# Patient Record
Sex: Female | Born: 1935
Health system: Southern US, Community
[De-identification: ages and names within clinical notes are randomized; demographics above are authoritative.]

## PROBLEM LIST (undated history)

## (undated) DIAGNOSIS — I10 Essential (primary) hypertension: Secondary | ICD-10-CM

## (undated) DIAGNOSIS — I6529 Occlusion and stenosis of unspecified carotid artery: Secondary | ICD-10-CM

## (undated) DIAGNOSIS — J9690 Respiratory failure, unspecified, unspecified whether with hypoxia or hypercapnia: Secondary | ICD-10-CM

## (undated) DIAGNOSIS — I2510 Atherosclerotic heart disease of native coronary artery without angina pectoris: Secondary | ICD-10-CM

## (undated) DIAGNOSIS — J189 Pneumonia, unspecified organism: Secondary | ICD-10-CM

## (undated) DIAGNOSIS — E871 Hypo-osmolality and hyponatremia: Secondary | ICD-10-CM

## (undated) DIAGNOSIS — E785 Hyperlipidemia, unspecified: Secondary | ICD-10-CM

## (undated) DIAGNOSIS — N289 Disorder of kidney and ureter, unspecified: Secondary | ICD-10-CM

## (undated) DIAGNOSIS — I509 Heart failure, unspecified: Secondary | ICD-10-CM

## (undated) DIAGNOSIS — E079 Disorder of thyroid, unspecified: Secondary | ICD-10-CM

## (undated) DIAGNOSIS — M1990 Unspecified osteoarthritis, unspecified site: Secondary | ICD-10-CM

## (undated) HISTORY — PX: CAROTID ENDARTERECTOMY: SUR193

## (undated) HISTORY — DX: Disorder of thyroid, unspecified: E07.9

## (undated) HISTORY — DX: Essential (primary) hypertension: I10

## (undated) HISTORY — DX: Occlusion and stenosis of unspecified carotid artery: I65.29

## (undated) HISTORY — DX: Atherosclerotic heart disease of native coronary artery without angina pectoris: I25.10

## (undated) HISTORY — PX: CARDIAC CATHETERIZATION: SHX172

## (undated) HISTORY — DX: Hyperlipidemia, unspecified: E78.5

---

## 2001-02-18 ENCOUNTER — Encounter: Payer: Self-pay | Admitting: *Deleted

## 2001-02-22 ENCOUNTER — Inpatient Hospital Stay (HOSPITAL_COMMUNITY): Admission: RE | Admit: 2001-02-22 | Discharge: 2001-02-23 | Payer: Self-pay | Admitting: *Deleted

## 2001-02-22 ENCOUNTER — Encounter (INDEPENDENT_AMBULATORY_CARE_PROVIDER_SITE_OTHER): Payer: Self-pay | Admitting: Specialist

## 2005-07-01 ENCOUNTER — Ambulatory Visit: Payer: Self-pay

## 2005-12-02 ENCOUNTER — Encounter: Admission: RE | Admit: 2005-12-02 | Discharge: 2005-12-08 | Payer: Self-pay | Admitting: Internal Medicine

## 2007-09-19 ENCOUNTER — Emergency Department (HOSPITAL_COMMUNITY): Admission: EM | Admit: 2007-09-19 | Discharge: 2007-09-19 | Payer: Self-pay | Admitting: Emergency Medicine

## 2007-09-29 ENCOUNTER — Ambulatory Visit: Payer: Self-pay

## 2008-01-26 ENCOUNTER — Ambulatory Visit (HOSPITAL_COMMUNITY): Admission: RE | Admit: 2008-01-26 | Discharge: 2008-01-26 | Payer: Self-pay | Admitting: Ophthalmology

## 2008-02-16 ENCOUNTER — Ambulatory Visit (HOSPITAL_COMMUNITY): Admission: RE | Admit: 2008-02-16 | Discharge: 2008-02-16 | Payer: Self-pay | Admitting: Ophthalmology

## 2010-08-05 ENCOUNTER — Inpatient Hospital Stay (HOSPITAL_COMMUNITY)
Admission: EM | Admit: 2010-08-05 | Discharge: 2010-08-08 | Payer: Self-pay | Source: Home / Self Care | Admitting: Emergency Medicine

## 2010-08-05 ENCOUNTER — Ambulatory Visit: Payer: Self-pay | Admitting: Internal Medicine

## 2010-08-26 DIAGNOSIS — I6529 Occlusion and stenosis of unspecified carotid artery: Secondary | ICD-10-CM

## 2010-08-26 DIAGNOSIS — I10 Essential (primary) hypertension: Secondary | ICD-10-CM

## 2010-08-27 ENCOUNTER — Ambulatory Visit: Payer: Self-pay | Admitting: Cardiology

## 2010-08-27 ENCOUNTER — Encounter: Payer: Self-pay | Admitting: Cardiology

## 2010-08-27 DIAGNOSIS — I2510 Atherosclerotic heart disease of native coronary artery without angina pectoris: Secondary | ICD-10-CM

## 2010-10-21 NOTE — Assessment & Plan Note (Signed)
Summary: [facility]   Visit Type:  Follow-up Primary Provider:  Loma Sousa, PAc  CC:  CAD.  History of Present Illness: The patient chest her treatment after recent myocardial infarction with stenting. 5 that she was having predominantly indigestion. She has felt remarkably better. She is breathing better and has not indigestion. She denies any chest pressure, neck or arm discomfort. She has no palpitations, presyncope or syncope. She has had no complications with her catheterization site. She has been doing her household chores and is active.   Current Medications (verified): 1)  Azor 5-20 Mg Tabs (Amlodipine-Olmesartan) .[name] Kitchen.. 1 By Mouth Daily 2)  Aspirin 81 Mg  Tabs (Aspirin) .[name] Kitchen.. 1 By Mouth Daily 3)  Simvastatin 20 Mg Tabs (Simvastatin) .[name] Kitchen.. 1 By Mouth Daily 4)  Plavix 75 Mg Tabs (Clopidogrel Bisulfate) .[name] Kitchen.. 1 By Mouth Daily 5)  Levothroid 50 Mcg Tabs (Levothyroxine Sodium) .[name] Kitchen.. 1 By Mouth Daily  Allergies (verified): No Known Drug Allergies  Past History:  Past Medical History:  1. Carotid artery stenosis.       a.     Left carotid endarterectomy, June 2002.   2. Hypertension.   3. Hyperlipidemia.   4. Hypothyroidism.   5. CAD (Three-vessel coronary artery disease, status post percutaneous     coronary intervention of the lesion in the distal circumflex artery     with a Promus drug-eluting stent.  Status post percutaneous     coronary intervention of the lesion in the second marginal branch     of the circumflex artery with a Promus drug-eluting stent.)  Review of Systems       As stated in the HPI and negative for all other systems.   Vital Signs:  Patient profile:   77 year old female Height:      66 inches Weight:      163 pounds BMI:     26.40 Pulse rate:   74 / minute Resp:     16 per minute BP sitting:   164 / 68  (right arm)  Vitals Entered By: Marrion Coy, CNA (August 27, 2010 3:25 PM)  Physical Exam  General:  Well developed, well  nourished, in no acute distress. Head:  normocephalic and atraumatic Neck:  Neck supple, no JVD. No masses, thyromegaly or abnormal cervical nodes. Chest Wall:  no deformities or breast masses noted Lungs:  Clear bilaterally to auscultation and percussion. Heart:  Non-displaced PMI, chest non-tender; regular rate and rhythm, S1, S2 without murmurs, rubs or gallops. Carotid upstroke normal, no bruit. Normal abdominal aortic size, no bruits. Femorals normal pulses, no bruits. Pedals normal pulses. No edema, no varicosities. Abdomen:  Bowel sounds positive; abdomen soft and non-tender without masses, organomegaly, or hernias noted. No hepatosplenomegaly. Msk:  Back normal, normal gait. Muscle strength and tone normal. Extremities:  No clubbing or cyanosis. Neurologic:  Alert and oriented x 3. Skin:  Intact without lesions or rashes. Cervical Nodes:  no significant adenopathy Inguinal Nodes:  no significant adenopathy Psych:  Normal affect.   Impression & Recommendations:  Problem # 1:  CORONARY ATHEROSCLEROSIS NATIVE CORONARY ARTERY (ICD-414.01)  The patient is tolerating meds as listed.  She will continue the Plavix x 1 year at least.  She did not tolerate beta blocker in the hospital.  I will not change her meds at this point. Orders: EKG w/ Interpretation (93000)  Her updated medication list for this problem includes:    Azor 5-20 Mg Tabs (Amlodipine-olmesartan) .[name] Kitchen... 1 by mouth daily  Aspirin 81 Mg Tabs (Aspirin) .[name] Kitchen... 1 by mouth daily    Plavix 75 Mg Tabs (Clopidogrel bisulfate) .[name] Kitchen... 1 by mouth daily  Problem # 2:  HYPERTENSION (ICD-401.9) Her BP is elevated here.  However, she checks it routinely at home and it is always under 120 systolic.  I will make no change in her Orders: EKG w/ Interpretation (93000)  Problem # 3:  CAROTID STENOSIS (ICD-433.10) we will follow this up as indicated.  Patient Instructions: 1)  Your physician recommends that you schedule a follow-up  appointment in: March 2012 with Dr Antoine Poche 2)  Your physician recommends that you continue on your current medications as directed. Please refer to the Current Medication list given to you today. Prescriptions: PLAVIX 75 MG TABS (CLOPIDOGREL BISULFATE) 1 by mouth daily  #30 x 11   Entered by:   Charolotte Capuchin, RN   Authorized by:   Rollene Rotunda, MD, [facility]   Signed by:   Charolotte Capuchin, RN on 08/27/2010   Method used:   Electronically to        [facility] [phone]* (retail)       [address] [place], [place]  [address]       Ph: [phone] or [phone]       Fax: [phone]   RxID:   [id number]

## 2010-11-26 ENCOUNTER — Encounter: Payer: Self-pay | Admitting: Cardiology

## 2010-12-02 LAB — BASIC METABOLIC PANEL
BUN: 15 mg/dL (ref 6–23)
Calcium: 8.4 mg/dL (ref 8.4–10.5)
Calcium: 9.1 mg/dL (ref 8.4–10.5)
Chloride: 101 mEq/L (ref 96–112)
Chloride: 101 mEq/L (ref 96–112)
Chloride: 98 mEq/L (ref 96–112)
Creatinine, Ser: 1.02 mg/dL (ref 0.4–1.2)
Creatinine, Ser: 1.07 mg/dL (ref 0.4–1.2)
Creatinine, Ser: 1.17 mg/dL (ref 0.4–1.2)
GFR calc Af Amer: 53 mL/min — ABNORMAL LOW (ref >60)
GFR calc Af Amer: 57 mL/min — ABNORMAL LOW (ref >60)
GFR calc Af Amer: 58 mL/min — ABNORMAL LOW (ref >60)
GFR calc non Af Amer: 51 mL/min — ABNORMAL LOW (ref >60)
Potassium: 4 mEq/L (ref 3.5–5.1)
Sodium: 131 mEq/L — ABNORMAL LOW (ref 135–145)

## 2010-12-02 LAB — CBC
HCT: 35.5 % — ABNORMAL LOW (ref 36.0–46.0)
HCT: 36.2 % (ref 36.0–46.0)
HCT: 37.2 % (ref 36.0–46.0)
Hemoglobin: 11.8 g/dL — ABNORMAL LOW (ref 12.0–15.0)
Hemoglobin: 12.3 g/dL (ref 12.0–15.0)
MCH: 29 pg (ref 26.0–34.0)
MCH: 29.4 pg (ref 26.0–34.0)
MCHC: 32.4 g/dL (ref 30.0–36.0)
MCHC: 33.1 g/dL (ref 30.0–36.0)
MCV: 88.3 fL (ref 78.0–100.0)
MCV: 88.4 fL (ref 78.0–100.0)
MCV: 89.6 fL (ref 78.0–100.0)
Platelets: 235 10*3/uL (ref 150–400)
RBC: 4.1 MIL/uL (ref 3.87–5.11)
RBC: 4.31 MIL/uL (ref 3.87–5.11)
RDW: 12.9 % (ref 11.5–15.5)
RDW: 13 % (ref 11.5–15.5)
WBC: 7.2 10*3/uL (ref 4.0–10.5)
WBC: 7.8 10*3/uL (ref 4.0–10.5)
WBC: 8.7 10*3/uL (ref 4.0–10.5)

## 2010-12-02 LAB — PROTIME-INR
INR: 1 (ref 0.00–1.49)
Prothrombin Time: 13.4 seconds (ref 11.6–15.2)

## 2010-12-02 LAB — COMPREHENSIVE METABOLIC PANEL
CO2: 25 mEq/L (ref 19–32)
Calcium: 8.9 mg/dL (ref 8.4–10.5)
Creatinine, Ser: 1 mg/dL (ref 0.4–1.2)
GFR calc non Af Amer: 52 mL/min — ABNORMAL LOW (ref >60)
Glucose, Bld: 104 mg/dL — ABNORMAL HIGH (ref 70–99)
Sodium: 132 mEq/L — ABNORMAL LOW (ref 135–145)
Total Protein: 6.7 g/dL (ref 6.0–8.3)

## 2010-12-02 LAB — DIFFERENTIAL
Lymphocytes Relative: 29 % (ref 12–46)
Lymphs Abs: 2.5 10*3/uL (ref 0.7–4.0)
Monocytes Relative: 10 % (ref 3–12)
Neutro Abs: 4.7 10*3/uL (ref 1.7–7.7)
Neutrophils Relative %: 55 % (ref 43–77)

## 2010-12-02 LAB — CARDIAC PANEL(CRET KIN+CKTOT+MB+TROPI)
CK, MB: 2.8 ng/mL (ref 0.3–4.0)
Relative Index: INVALID (ref 0.0–2.5)
Total CK: 61 U/L (ref 7–177)
Troponin I: 0.09 ng/mL — ABNORMAL HIGH (ref 0.00–0.06)
Troponin I: 0.1 ng/mL — ABNORMAL HIGH (ref 0.00–0.06)

## 2010-12-02 LAB — TSH: TSH: 1.256 u[IU]/mL (ref 0.350–4.500)

## 2010-12-02 LAB — HEPARIN LEVEL (UNFRACTIONATED): Heparin Unfractionated: <0.1 IU/mL — ABNORMAL LOW (ref 0.30–0.70)

## 2010-12-02 LAB — LIPID PANEL
Triglycerides: 102 mg/dL (ref <150)
VLDL: 20 mg/dL (ref 0–40)

## 2010-12-10 ENCOUNTER — Ambulatory Visit (INDEPENDENT_AMBULATORY_CARE_PROVIDER_SITE_OTHER): Payer: Medicare Other | Admitting: Cardiology

## 2010-12-10 ENCOUNTER — Encounter: Payer: Self-pay | Admitting: Cardiology

## 2010-12-10 VITALS — BP 178/76 | HR 91 | Resp 16 | Ht 66.0 in | Wt 159.0 lb

## 2010-12-10 DIAGNOSIS — I2510 Atherosclerotic heart disease of native coronary artery without angina pectoris: Secondary | ICD-10-CM

## 2010-12-10 DIAGNOSIS — E785 Hyperlipidemia, unspecified: Secondary | ICD-10-CM

## 2010-12-10 DIAGNOSIS — I6529 Occlusion and stenosis of unspecified carotid artery: Secondary | ICD-10-CM

## 2010-12-10 NOTE — Assessment & Plan Note (Signed)
She is having no ongoing symptoms. No change in therapy is indicated.

## 2010-12-10 NOTE — Assessment & Plan Note (Signed)
She has known stenosis and is overdue for followup. I will schedule Doppler.

## 2010-12-10 NOTE — Assessment & Plan Note (Signed)
I will her present were profile with a goal LDL less than 100 and HDL greater than 40. Of note is she needs higher dose statin we will need to switch to Lipitor given her Norvasc.

## 2010-12-10 NOTE — Progress Notes (Signed)
HPI  The patient presents for followup of her known coronary disease. Since I last saw her she had relatively well. She will occasionally get some chest discomfort. However, she has no chest pain that she necessarily associated with her previous infarct. She has not taken any nitroglycerin. She does her household chores. She denies any palpitations, presyncope or syncope. She denies any weight gain or edema. She has not had any new shortness of breath, PND or orthopnea.  No Known Allergies  Current Outpatient Prescriptions on File Prior to Visit  Medication Sig Dispense Refill  . amLODipine-olmesartan (AZOR) 5-20 MG per tablet Take 1 tablet by mouth daily.        [name] Kitchen aspirin 81 MG tablet Take 81 mg by mouth daily.        . clopidogrel (PLAVIX) 75 MG tablet Take 75 mg by mouth daily.        [name] Kitchen levothyroxine (SYNTHROID, LEVOTHROID) 50 MCG tablet Take 50 mcg by mouth daily.        . simvastatin (ZOCOR) 20 MG tablet Take 20 mg by mouth at bedtime.          Past Medical History  Diagnosis Date  . Carotid artery stenosis     Left carotid endarterectomy 02/2001  . Hypertension   . Hyperlipidemia   . Thyroid disease   . CAD (coronary artery disease)      Three-vessel coronary artery disease, status post percutaneous coronary intervention of the lesion in the distal circumflex artery  with a Promus drug-eluting stent.  Status post percutaneous coronary intervention of the lesion in the second marginal branchof the circumflex artery with a Promus drug-eluting stent.     Past Surgical History  Procedure Date  . Carotid endarterectomy     left 02/2001    ROS As stated in the HPI and negative for all other systems except   BP 178/76  Pulse 91  Resp 16  Ht 5\' 6"  (1.676 m)  Wt 159 lb (72.122 kg)  BMI 25.66 kg/m2  PHYSICAL EXAM HEENT:  Pupils equal round and reactive, fundi not visualized, oral mucosa unremarkable NECK:  No jugular venous distention, waveform within normal limits, carotid  upstroke brisk and symmetric, left bruit and CEA scar, no thyromegaly LYMPHATICS:  No cervical, axillary adenopathy LUNGS:  Clear to auscultation bilaterally BACK:  No CVA tenderness CHEST:  Unremarkable HEART:  PMI not displaced or sustained,,S1 and S2 within normal limits, no S3, no S4, no clicks, no rubs, no murmurs ABD:  Flat, positive bowel sounds normal in frequency in pitch, no bruits, no rebound, no guarding, no midline pulsatile mass, no hepatomegaly, no splenomegaly EXT:  2 plus pulses upper, diminished bilateral DP,PT, no edema, no cyanosis no clubbing SKIN:  No rashes no nodules NEURO:  Cranial nerves II through XII grossly intact, motor grossly intact throughout PSYCH:  Cognitively intact, oriented to person place and time  ASSESSMENT:

## 2010-12-10 NOTE — Assessment & Plan Note (Signed)
Her blood pressure in the office is elevated. She didn't bring the blood pressure diary reports that he is always controlled. She understands the systolic needs to be less than 140. At this point I will not change her medicines.

## 2010-12-10 NOTE — Patient Instructions (Addendum)
Follow up in 6 months with Dr Antoine Poche in the [place] office. Have a carotid doppler in the [facility] office Have a fasting lipid profile drawn at [facility].

## 2010-12-15 ENCOUNTER — Encounter: Payer: Self-pay | Admitting: Cardiology

## 2010-12-15 ENCOUNTER — Other Ambulatory Visit: Payer: Self-pay | Admitting: Cardiology

## 2010-12-15 DIAGNOSIS — I6529 Occlusion and stenosis of unspecified carotid artery: Secondary | ICD-10-CM

## 2010-12-16 ENCOUNTER — Encounter (INDEPENDENT_AMBULATORY_CARE_PROVIDER_SITE_OTHER): Payer: Medicare Other | Admitting: *Deleted

## 2010-12-16 DIAGNOSIS — I6529 Occlusion and stenosis of unspecified carotid artery: Secondary | ICD-10-CM

## 2010-12-25 ENCOUNTER — Encounter: Payer: Self-pay | Admitting: Cardiology

## 2011-02-06 NOTE — Op Note (Signed)
[place]. [facility]  Patient:    Lori [name], Lori [name]                     MRN: [id number] Proc. Date: 02/22/01 Adm. Date:  [id number] Attending:  Melvenia Needles CC:         Gweneth Dimitri, M.D.  Rollene Rotunda, M.D. [facility]   Operative Report  PREOPERATIVE DIAGNOSIS:  Severe left internal carotid artery stenosis.  POSTOPERATIVE DIAGNOSIS:  Severe left internal carotid artery stenosis.  PROCEDURE:  Left carotid endarterectomy.  SURGEON:  Denman George, M.D.  ASSISTANT:  Dominica Severin, P.A.  ANESTHESIA:  General endotracheal.  ANESTHESIOLOGIST:  Burna Forts, M.D.  CLINICAL NOTE:  This is an 77 year old female referred for evaluation of a severe left internal carotid artery stenosis.  This was verified by Doppler evaluation.  Patient seen and evaluated, recommended she undergo a left carotid endarterectomy for reduction of stroke risk.  Risks and benefits of the operative procedure explained in detail.  Potential risks to the operative procedure, including MI, CVA, and death, discussed, with a risk rate of 1-2%. The patient consented for surgery.  DESCRIPTION OF PROCEDURE:  Patient brought to the operating room in stable condition.  Placed in the supine position.  General endotracheal anesthesia induced.  Arterial line and Foley catheter in place.  Left neck prepped and draped in a sterile fashion.  Curvilinear skin incision made along the anterior border of the left sternomastoid muscle.  Subcutaneous tissue and platysma divided with electrocautery.  Deep dissection carried down along the anterior border of the sternomastoid to the carotid sheath.  The common carotid artery mobilized proximally and encircled with a vessel loop.  The vagus nerve reflected laterally and preserved.  Dissection carried distally up to the carotid bifurcation.  The superior thyroid and external carotid encircled at the origins with vessel loops.  The internal  carotid artery exposed distally through the posterior belly of the digastric muscle.  The hypoglossal nerve reflected superiorly and preserved.  The distal internal carotid artery encircled with a vessel loop.  The carotid bifurcation revealed calcified plaque disease with a short segment of plaque extending into the internal carotid artery.  The patient administered 7000 units of heparin intravenously.  Adequate circulation time permitted.  The carotid vessels controlled with clamps. Longitudinal arteriotomy made in the distal common carotid artery.  The arteriotomy extended across the carotid bulb and up into the internal carotid artery.  Ulcerated plaque was present at the origin of the internal carotid artery.  Approximately 90% stenosis.  A shunt was inserted.  The plaque removed with an endarterectomy elevator.  Plaque divided proximally in the common carotid artery with Potts scissors.  Plaque feathered out of the internal carotid artery and the external carotid artery endarterectomized with eversion.  There was a slight shelf of intima present distally, and this was tacked down with interrupted 7-0 Prolene suture.  The site irrigated with heparin and saline solution and fragments of plaque removed with plaque forceps.  A primary arteriotomy closure was carried out with running 7-0 Prolene suture. The internal carotid artery was of good caliber.  Shunt then removed.  All vessels well-flushed.  Clamps were removed, directing the initial antegrade flow up the external carotid artery.  Following this, the internal carotid was released.  There was an excellent pulse and Doppler signal in the distal internal carotid artery.  Sternomastoid fascia closed with running 2-0 Vicryl suture.  Platysma closed with running 3-0  Vicryl suture.  Skin closed with 4-0 Monocryl.  Half-inch Steri-Strips applied.  The patient tolerated the procedure well.  Transferred to the recovery  room neurologically intact.  No apparent intraoperative complications. DD:  02/22/01 TD:  02/22/01 Job: [id number]

## 2011-02-06 NOTE — Discharge Summary (Signed)
[place]. [facility]  Patient:    Lori [name], Lori [name]                     MRN: [id number] Adm. Date:  [id number] Disc. Date: [id number] Attending:  Melvenia Needles Dictator:   Dominica Severin, P.A. CC:         Gweneth Dimitri, M.D.  Rollene Rotunda, M.D. [facility]   Discharge Summary  DATE OF BIRTH:  1918/12/10  PRIMARY ADMISSION DIAGNOSIS:  Left internal carotid artery stenosis.  SECONDARY DIAGNOSES/PAST MEDICAL HISTORY: 1. Hypertension. 2. Hyperlipidemia.  NEW DIAGNOSIS THIS ADMISSION:  Status post left carotid endarterectomy on February 22, 2001.  PROCEDURE:  Left carotid endarterectomy on February 22, 2001, done by Dr. Madilyn Fireman.  HISTORY OF PRESENT ILLNESS:  The patient was seen in consultation by Dr. Madilyn Fireman at his office for evaluation of left carotid stenosis.  She is an 77 year old female with history of atypical chest pain.  She had undergone stress testing in 1998 which was unremarkable and recently is noted to have axillary hypertension.  During recent evaluation, she was found to have a left carotid bruit.  She did not have any symptoms of cerebrovascular disease or any deficits.  It was then determined the patient would benefit from having a left carotid endarterectomy in order to prevent any strokes.  HOSPITAL COURSE:  The patient underwent the surgery on February 22, 2001.  She tolerated the procedure well.  Her postoperative course was essentially uneventful.  She did not have any cardiac or respiratory complications. Neurologically, cranial nerves II-XII were grossly intact.  There were no focal deficits.  She was ambulated and tolerating her diet, and she is anticipated for discharge on February 23, 2001.  DISCHARGE CONDITION:  Stable.  DISCHARGE MEDICATIONS: 1. Darvocet-N 100 one or two tablets q.4-6h. p.r.n. pain. 2. Toprol XL 50 mg once q.d. 3. Lipitor 10 mg once q.d. 4. Coated aspirin 325 mg once q.d.  DISCHARGE INSTRUCTIONS:  Activity:  The patient  instructed not to do any driving or strenuous activity or heavy lifting.  Told she should walk daily and continue breathing exercises.  She was told to follow a hearth healthy diet.  She can shower.  Notify the office of any temperature greater than 101 degrees Fahrenheit or if there is any change in the wounds appearance or if shows increased redness, swelling, or drainage.  DISCHARGE FOLLOWUP:  She has a follow-up visit scheduled with Dr. Madilyn Fireman on Monday, June 24 at 8:30 a.m., and she is to follow up with Dr. Antoine Poche and Dr. Corliss Blacker as directed. DD:  02/23/01 TD:  02/23/01 Job: [id number]

## 2011-02-06 NOTE — H&P (Signed)
[place]. [facility]  Patient:    Lori [name], Lori [name]                       MRN: [id number] Adm. Date:  02/22/01 Attending:  Denman George, M.D. Dictator:   Durenda Age, P.A.-C. CC:         Lori [name], P.A.  Rollene Rotunda, M.D. [facility]   History and Physical  DATE OF BIRTH:  May 26, 1919  CHIEF COMPLAINT:  Left carotid artery disease.  HISTORY OF PRESENT ILLNESS:  Lori [name] is a pleasant 77 year old white female referred by Dr. Antoine Poche for evaluation of coronary artery disease. During a routine physical exam she was found to have a left carotid bruit. Carotid Dopplers revealed significantly elevated left carotid velocities, consistent with severe left ICA stenosis.  Dr. Madilyn Fireman, upon evaluation, recommended to proceed with left CEA, scheduled for February 22, 2001.  Other than mild headaches, one episode one year ago of vertigo, the patient denies any nausea, vomiting, dizziness, falls, seizures, numbness, tingling, muscle weakness, speech impairment, dysphagia, visual changes, syncope, presyncope, memory loss, or confusion.  PAST MEDICAL HISTORY: 1. Coronary artery disease. 2. Hypertension. 3. Hypercholesterolemia.  PAST SURGICAL HISTORY:  None.  MEDICATIONS: 1. Toprol 50 mg q.d. 2. Lipitor 10 mg q.d. 3. Aspirin q.d.  ALLERGIES:  No known drug allergies.  REVIEW OF SYSTEMS:  See HPI and past medical history for significant positives.  Otherwise, she denies a history of diabetes, kidney disease, or asthma.  She does have atypical chest discomfort on occasion, which was evaluated by Dr. Antoine Poche and was negative for cardiac etiology.  FAMILY HISTORY:  Mother died at 40 of heart disease.  Father died at 67 of cancer.  Two sisters alive, with heart disease.  One brother died of cancer.  SOCIAL HISTORY:  Married, two children.  She is retired from [employer]. She denies any tobacco or alcohol intake.  PHYSICAL EXAMINATION:  GENERAL:   Well-developed, well-nourished 77 year old white female in no acute distress.  Alert and oriented x 3.  She looks much younger than her stated age.  VITAL SIGNS:  Blood pressure 120/58, pulse 68, respirations 18.  HEENT:  Normocephalic, atraumatic.  PERRLA, EOMI.  Left cataract.  No ______ or macular degeneration.  NECK:  Supple.  No JVD.  There is a left bruit in the lower portion of the neck.  No lymphadenopathy.  CHEST:  Symmetrical on inspiration.  LUNGS:  Clear to auscultation bilaterally.  CARDIOVASCULAR:  Regular rate and rhythm.  No murmurs, rubs, or gallops.  ABDOMEN:  Soft and nontender.  Bowel sounds x 4.  No masses or bruits.  GU/RECTAL:  Deferred.  EXTREMITIES:  No clubbing, cyanosis, or edema.  There are multiple senile keratoses of the back area and of the chest.  There is one area of eczema in the right pretibial area.  No ulcerations.  Temperature warm.  Peripheral pulses, carotids 2+ bilaterally.  Femoral, poplitea, dorsalis pedis, and posterior tibialis 2+ bilaterally.  NEUROLOGIC:  Nonfocal.  Steady gait despite arthritic changes.  ______ . DTRs 2+ bilaterally.  Muscle strength 5/5.  ASSESSMENT AND PLAN:  Left ICA stenosis, for left CEA on February 22, 2001. Dr. Madilyn Fireman has seen and evaluated this patient prior to admission and has explained the risks and benefits involving the procedure, and the patient has agreed to continue. DD:  02/22/01 TD:  02/22/01 Job: [id number]

## 2011-03-04 ENCOUNTER — Encounter: Payer: Self-pay | Admitting: Cardiology

## 2011-03-04 ENCOUNTER — Ambulatory Visit (INDEPENDENT_AMBULATORY_CARE_PROVIDER_SITE_OTHER): Payer: Medicare Other | Admitting: Cardiology

## 2011-03-04 VITALS — BP 152/70 | HR 81 | Resp 18 | Ht 66.0 in | Wt 158.0 lb

## 2011-03-04 DIAGNOSIS — R000 Tachycardia, unspecified: Secondary | ICD-10-CM

## 2011-03-04 DIAGNOSIS — I6529 Occlusion and stenosis of unspecified carotid artery: Secondary | ICD-10-CM

## 2011-03-04 DIAGNOSIS — I10 Essential (primary) hypertension: Secondary | ICD-10-CM

## 2011-03-04 DIAGNOSIS — E785 Hyperlipidemia, unspecified: Secondary | ICD-10-CM

## 2011-03-04 DIAGNOSIS — I2510 Atherosclerotic heart disease of native coronary artery without angina pectoris: Secondary | ICD-10-CM

## 2011-03-04 NOTE — Assessment & Plan Note (Signed)
She will continue to check her blood pressures and keep a diary and I will review this when I see her back following the Holter.

## 2011-03-04 NOTE — Progress Notes (Signed)
HPI The patient presents for followup of recent fluctuating blood pressures and rapid heart rates. She says that her blood pressure has been going up and down although she's not had any presyncope or syncope with this. She has felt some rapid heart rates with pulses from the 70s to 110 range. She's not having any heavy gripping chest discomfort that she had with her heart attack. She is not describing sustained tachyarrhythmias but rather heart rates go up with activities. She's had some occasional vague throat discomfort but no new shortness of breath, PND or orthopnea.  No Known Allergies  Current Outpatient Prescriptions  Medication Sig Dispense Refill  . amLODipine-olmesartan (AZOR) 5-20 MG per tablet Take 1 tablet by mouth daily.        [name] Kitchen aspirin 81 MG tablet Take 81 mg by mouth daily.        . clopidogrel (PLAVIX) 75 MG tablet Take 75 mg by mouth daily.        [name] Kitchen levothyroxine (SYNTHROID, LEVOTHROID) 50 MCG tablet Take 50 mcg by mouth daily.        . simvastatin (ZOCOR) 20 MG tablet Take 20 mg by mouth at bedtime.          Past Medical History  Diagnosis Date  . Carotid artery stenosis     Left carotid endarterectomy 02/2001  . Hypertension   . Hyperlipidemia   . Thyroid disease   . CAD (coronary artery disease)      Three-vessel coronary artery disease, status post percutaneous coronary intervention of the lesion in the distal circumflex artery  with a Promus drug-eluting stent.  Status post percutaneous coronary intervention of the lesion in the second marginal branchof the circumflex artery with a Promus drug-eluting stent.     Past Surgical History  Procedure Date  . Carotid endarterectomy     left 02/2001    ROS  As stated in the HPI and negative for all other systems except   BP 128/68  Pulse 82  Resp 18  Ht 5\' 6"  (1.676 m)  Wt 158 lb (71.668 kg)  BMI 25.50 kg/m2  PHYSICAL EXAM HEENT:  Pupils equal round and reactive, fundi not visualized, oral mucosa  unremarkable NECK:  No jugular venous distention, waveform within normal limits, carotid upstroke brisk and symmetric, left bruit and CEA scar, no thyromegaly LYMPHATICS:  No cervical, axillary adenopathy LUNGS:  Clear to auscultation bilaterally BACK:  No CVA tenderness CHEST:  Unremarkable HEART:  PMI not displaced or sustained,,S1 and S2 within normal limits, no S3, no S4, no clicks, no rubs, no murmurs ABD:  Flat, positive bowel sounds normal in frequency in pitch, no bruits, no rebound, no guarding, no midline pulsatile mass, no hepatomegaly, no splenomegaly EXT:  2 plus pulses upper, diminished bilateral DP,PT, no edema, no cyanosis no clubbing SKIN:  No rashes no nodules NEURO:  Cranial nerves II through XII grossly intact, motor grossly intact throughout PSYCH:  Cognitively intact, oriented to person place and time  EKG:  NSR, nonspecific anterior ST changes.  No change from previous.  ASSESSMENT AND PLAN

## 2011-03-04 NOTE — Assessment & Plan Note (Signed)
I do not believe this to be an anginal equivalent. She will continue with risk reduction.

## 2011-03-04 NOTE — Assessment & Plan Note (Signed)
I will start with a 48 hour Holter monitor to try to determine whether this is sinus tachycardia or not. I do note her hospital records that she was intolerant of beta blockers in the hospital so I will keep this in mind if further treatment is needed.

## 2011-03-04 NOTE — Patient Instructions (Signed)
Please wear a 48 hour monitor Follow up with Dr Antoine Poche after the monitor in the [place] office Please continue your current medications

## 2011-03-12 ENCOUNTER — Other Ambulatory Visit: Payer: Self-pay | Admitting: *Deleted

## 2011-03-12 MED ORDER — SIMVASTATIN 20 MG PO TABS: 20.0000 mg | ORAL_TABLET | Freq: Every day | ORAL | Status: DC

## 2011-03-19 ENCOUNTER — Other Ambulatory Visit: Payer: Self-pay | Admitting: *Deleted

## 2011-03-19 MED ORDER — AMLODIPINE-OLMESARTAN 5-20 MG PO TABS: 1.0000 | ORAL_TABLET | Freq: Every day | ORAL | Status: DC

## 2011-04-01 ENCOUNTER — Ambulatory Visit (INDEPENDENT_AMBULATORY_CARE_PROVIDER_SITE_OTHER): Payer: Medicare Other | Admitting: Cardiology

## 2011-04-01 ENCOUNTER — Encounter: Payer: Self-pay | Admitting: Cardiology

## 2011-04-01 DIAGNOSIS — R000 Tachycardia, unspecified: Secondary | ICD-10-CM

## 2011-04-01 DIAGNOSIS — I6529 Occlusion and stenosis of unspecified carotid artery: Secondary | ICD-10-CM

## 2011-04-01 DIAGNOSIS — I2510 Atherosclerotic heart disease of native coronary artery without angina pectoris: Secondary | ICD-10-CM

## 2011-04-01 DIAGNOSIS — I10 Essential (primary) hypertension: Secondary | ICD-10-CM

## 2011-04-01 NOTE — Assessment & Plan Note (Signed)
We discussed treatment with beta blockers but will avoid this. I will have her check a TSH today going over his normal in November. I do think these palpitations certainly could have been related to leg pain she is having.

## 2011-04-01 NOTE — Assessment & Plan Note (Signed)
The patient has no new sypmtoms.  No further cardiovascular testing is indicated.  We will continue with aggressive risk reduction and meds as listed.  

## 2011-04-01 NOTE — Patient Instructions (Signed)
Please have lab drawn today. (TSH) Please continue your current medications as listed Follow up with Dr Antoine Poche in 6 months

## 2011-04-01 NOTE — Progress Notes (Signed)
HPI The patient presents for followup of recent fluctuating blood pressures and rapid heart rates. I had her wear a 24-hour monitor which demonstrated sinus rhythm and occasional sinus tachycardia with rates only in 105 range.  However, she continues to have tachycardia palpitations. She does relate this to light and she has. This is particularly bad in the morning. She also describes some numbness down both of her legs.  The patient denies any new symptoms such as chest discomfort, neck or arm discomfort. There has been no new shortness of breath, PND or orthopnea. There have been no reported episodes of presyncope or syncope.  No Known Allergies  Current Outpatient Prescriptions  Medication Sig Dispense Refill  . amLODipine-olmesartan (AZOR) 5-20 MG per tablet Take 1 tablet by mouth daily.  30 tablet  11  . aspirin 81 MG tablet Take 81 mg by mouth daily.        . clopidogrel (PLAVIX) 75 MG tablet Take 75 mg by mouth daily.        [name] Kitchen levothyroxine (SYNTHROID, LEVOTHROID) 50 MCG tablet Take 50 mcg by mouth daily.        . simvastatin (ZOCOR) 20 MG tablet Take 1 tablet (20 mg total) by mouth at bedtime.  30 tablet  6    Past Medical History  Diagnosis Date  . Carotid artery stenosis     Left carotid endarterectomy 02/2001  . Hypertension   . Hyperlipidemia   . Thyroid disease   . CAD (coronary artery disease)      Three-vessel coronary artery disease, status post percutaneous coronary intervention of the lesion in the distal circumflex artery  with a Promus drug-eluting stent.  Status post percutaneous coronary intervention of the lesion in the second marginal branchof the circumflex artery with a Promus drug-eluting stent.     Past Surgical History  Procedure Date  . Carotid endarterectomy     left 02/2001    ROS  As stated in the HPI and negative for all other systems except   BP 152/66  Pulse 96  Resp 16  Ht 5\' 6"  (1.676 m)  Wt 160 lb (72.576 kg)  BMI 25.82 kg/m2  PHYSICAL  EXAM HEENT:  Pupils equal round and reactive, fundi not visualized, oral mucosa unremarkable NECK:  No jugular venous distention, waveform within normal limits, carotid upstroke brisk and symmetric, left bruit and CEA scar, no thyromegaly LYMPHATICS:  No cervical, axillary adenopathy LUNGS:  Clear to auscultation bilaterally BACK:  No CVA tenderness CHEST:  Unremarkable HEART:  PMI not displaced or sustained,,S1 and S2 within normal limits, no S3, no S4, no clicks, no rubs, no murmurs ABD:  Flat, positive bowel sounds normal in frequency in pitch, no bruits, no rebound, no guarding, no midline pulsatile mass, no hepatomegaly, no splenomegaly EXT:  2 plus pulses upper, diminished bilateral DP,PT, no edema, no cyanosis no clubbing SKIN:  No rashes no nodules NEURO:  Cranial nerves II through XII grossly intact, motor grossly intact throughout PSYCH:  Cognitively intact, oriented to person place and time  EKG:  NSR, nonspecific anterior ST changes.  No change from previous.  ASSESSMENT AND PLAN

## 2011-04-01 NOTE — Assessment & Plan Note (Signed)
Her blood pressure is slightly elevated today but otherwise has been well controlled. She will continue on meds as listed.

## 2011-04-02 ENCOUNTER — Encounter: Payer: Self-pay | Admitting: Cardiology

## 2011-04-06 ENCOUNTER — Encounter: Payer: Self-pay | Admitting: Cardiology

## 2011-06-16 LAB — BASIC METABOLIC PANEL
BUN: 14
Chloride: 100
Glucose, Bld: 100 — ABNORMAL HIGH
Potassium: 4.5

## 2011-06-16 LAB — HEMOGLOBIN AND HEMATOCRIT, BLOOD: Hemoglobin: 13.3

## 2011-06-26 LAB — POCT CARDIAC MARKERS
Myoglobin, poc: 144
Operator id: 257131

## 2011-06-26 LAB — DIFFERENTIAL
Basophils Absolute: 0.1
Basophils Relative: 1
Eosinophils Absolute: 0.4
Neutrophils Relative %: 53

## 2011-06-26 LAB — BASIC METABOLIC PANEL
CO2: 28
GFR calc Af Amer: >60
GFR calc non Af Amer: 51 — ABNORMAL LOW
Glucose, Bld: 99
Potassium: 4.9
Sodium: 137

## 2011-06-26 LAB — CBC
HCT: 40
Hemoglobin: 13.1
RBC: 4.55
RDW: 12.7

## 2011-08-07 ENCOUNTER — Encounter: Payer: Self-pay | Admitting: Cardiology

## 2011-08-27 ENCOUNTER — Other Ambulatory Visit: Payer: Self-pay | Admitting: Dermatology

## 2011-09-17 ENCOUNTER — Other Ambulatory Visit: Payer: Self-pay | Admitting: Cardiology

## 2011-09-30 ENCOUNTER — Ambulatory Visit (INDEPENDENT_AMBULATORY_CARE_PROVIDER_SITE_OTHER): Payer: Medicare Other | Admitting: Cardiology

## 2011-09-30 ENCOUNTER — Encounter: Payer: Self-pay | Admitting: Cardiology

## 2011-09-30 DIAGNOSIS — I6529 Occlusion and stenosis of unspecified carotid artery: Secondary | ICD-10-CM

## 2011-09-30 DIAGNOSIS — I2510 Atherosclerotic heart disease of native coronary artery without angina pectoris: Secondary | ICD-10-CM

## 2011-09-30 DIAGNOSIS — I10 Essential (primary) hypertension: Secondary | ICD-10-CM

## 2011-09-30 DIAGNOSIS — E785 Hyperlipidemia, unspecified: Secondary | ICD-10-CM

## 2011-09-30 NOTE — Assessment & Plan Note (Signed)
The patient has no new sypmtoms.  No further cardiovascular testing is indicated.  We will continue with aggressive risk reduction and meds as listed.  

## 2011-09-30 NOTE — Patient Instructions (Signed)
Stop Simvastatin  The current medical regimen is effective;  continue present plan and medications.  Follow up in 2 months  Please keep a blood pressure diary.

## 2011-09-30 NOTE — Assessment & Plan Note (Signed)
She is due for followup of this in March.

## 2011-09-30 NOTE — Assessment & Plan Note (Signed)
I have suggested that she discontinue simvistatin as it could be contributing to her muscle aches and nausea. She will let him know she's had improvement.

## 2011-09-30 NOTE — Assessment & Plan Note (Signed)
She would like to avoid increased or new medications. She will keep a blood pressure pressure diary. If her systolics are above 140 consistently I would increase her medicines

## 2011-09-30 NOTE — Progress Notes (Signed)
   HPI The patient presents for followup of recent fluctuating blood pressures.  Since I last saw her she has done relatively well. She's had some muscle aches. She's had some continued nausea which she thinks is related to simvastatin. Her blood pressure is elevated today but she has not been taking readings at home. She has no new chest pressure, neck or arm discomfort. She has no new shortness of breath, PND or orthopnea. She has had no palpitations, presyncope or syncope. She has had no weight gain or edema.  No Known Allergies  Current Outpatient Prescriptions  Medication Sig Dispense Refill  . amLODipine-olmesartan (AZOR) 5-20 MG per tablet Take 1 tablet by mouth daily.  30 tablet  11  . aspirin 81 MG tablet Take 81 mg by mouth daily.        . clopidogrel (PLAVIX) 75 MG tablet TAKE ONE TABLET BY MOUTH ONE TIME DAILY  30 tablet  10  . levothyroxine (SYNTHROID, LEVOTHROID) 50 MCG tablet Take 50 mcg by mouth daily.        . simvastatin (ZOCOR) 20 MG tablet Take 1 tablet (20 mg total) by mouth at bedtime.  30 tablet  6    Past Medical History  Diagnosis Date  . Carotid artery stenosis     Left carotid endarterectomy 02/2001  . Hypertension   . Hyperlipidemia   . Thyroid disease   . CAD (coronary artery disease)      Three-vessel coronary artery disease, status post percutaneous coronary intervention of the lesion in the distal circumflex artery  with a Promus drug-eluting stent.  Status post percutaneous coronary intervention of the lesion in the second marginal branchof the circumflex artery with a Promus drug-eluting stent.     Past Surgical History  Procedure Date  . Carotid endarterectomy     left 02/2001    ROS  As stated in the HPI and negative for all other systems except  PHYSICAL EXAM BP 170/70  Pulse 76  Ht 5\' 6"  (1.676 m)  Wt 162 lb (73.483 kg)  BMI 26.15 kg/m2 HEENT:  Pupils equal round and reactive, fundi not visualized, oral mucosa unremarkable NECK:  No jugular  venous distention, waveform within normal limits, carotid upstroke brisk and symmetric, left bruit and CEA scar, no thyromegaly LYMPHATICS:  No cervical, axillary adenopathy LUNGS:  Clear to auscultation bilaterally BACK:  No CVA tenderness CHEST:  Unremarkable HEART:  PMI not displaced or sustained,,S1 and S2 within normal limits, no S3, no S4, no clicks, no rubs, no murmurs ABD:  Flat, positive bowel sounds normal in frequency in pitch, no bruits, no rebound, no guarding, no midline pulsatile mass, no hepatomegaly, no splenomegaly EXT:  2 plus pulses upper, diminished bilateral DP,PT, no edema, no cyanosis no clubbing SKIN:  No rashes no nodules NEURO:  Cranial nerves II through XII grossly intact, motor grossly intact throughout PSYCH:  Cognitively intact, oriented to person place and time  EKG:  Sinus rhythm, rate 77, axis within normal limits, intervals within normal limits, no acute ST-T wave changes.  09/30/2011   ASSESSMENT AND PLAN

## 2011-10-25 DIAGNOSIS — Z7982 Long term (current) use of aspirin: Secondary | ICD-10-CM | POA: Diagnosis not present

## 2011-10-25 DIAGNOSIS — M79609 Pain in unspecified limb: Secondary | ICD-10-CM | POA: Diagnosis not present

## 2011-10-25 DIAGNOSIS — I10 Essential (primary) hypertension: Secondary | ICD-10-CM | POA: Diagnosis not present

## 2011-10-25 DIAGNOSIS — M5430 Sciatica, unspecified side: Secondary | ICD-10-CM | POA: Diagnosis not present

## 2011-10-25 DIAGNOSIS — Z79899 Other long term (current) drug therapy: Secondary | ICD-10-CM | POA: Diagnosis not present

## 2011-10-25 DIAGNOSIS — Z7902 Long term (current) use of antithrombotics/antiplatelets: Secondary | ICD-10-CM | POA: Diagnosis not present

## 2011-12-10 ENCOUNTER — Other Ambulatory Visit: Payer: Self-pay | Admitting: *Deleted

## 2011-12-10 DIAGNOSIS — I6529 Occlusion and stenosis of unspecified carotid artery: Secondary | ICD-10-CM

## 2011-12-16 ENCOUNTER — Encounter (INDEPENDENT_AMBULATORY_CARE_PROVIDER_SITE_OTHER): Payer: Medicare Other

## 2011-12-16 DIAGNOSIS — I6529 Occlusion and stenosis of unspecified carotid artery: Secondary | ICD-10-CM

## 2012-02-03 DIAGNOSIS — B369 Superficial mycosis, unspecified: Secondary | ICD-10-CM | POA: Diagnosis not present

## 2012-03-09 DIAGNOSIS — L538 Other specified erythematous conditions: Secondary | ICD-10-CM | POA: Diagnosis not present

## 2012-03-10 ENCOUNTER — Emergency Department (HOSPITAL_COMMUNITY)
Admission: EM | Admit: 2012-03-10 | Discharge: 2012-03-11 | Disposition: A | Discharge status: Home / Self Care | Payer: Medicare Other | Source: Home / Self Care | Attending: Emergency Medicine | Admitting: Emergency Medicine

## 2012-03-10 ENCOUNTER — Encounter (HOSPITAL_COMMUNITY): Payer: Self-pay | Admitting: Emergency Medicine

## 2012-03-10 ENCOUNTER — Emergency Department (HOSPITAL_COMMUNITY): Payer: Medicare Other

## 2012-03-10 DIAGNOSIS — I10 Essential (primary) hypertension: Secondary | ICD-10-CM | POA: Insufficient documentation

## 2012-03-10 DIAGNOSIS — I2510 Atherosclerotic heart disease of native coronary artery without angina pectoris: Secondary | ICD-10-CM | POA: Insufficient documentation

## 2012-03-10 DIAGNOSIS — J9819 Other pulmonary collapse: Secondary | ICD-10-CM | POA: Diagnosis not present

## 2012-03-10 DIAGNOSIS — R5381 Other malaise: Secondary | ICD-10-CM | POA: Diagnosis not present

## 2012-03-10 DIAGNOSIS — E079 Disorder of thyroid, unspecified: Secondary | ICD-10-CM | POA: Insufficient documentation

## 2012-03-10 DIAGNOSIS — N390 Urinary tract infection, site not specified: Secondary | ICD-10-CM | POA: Insufficient documentation

## 2012-03-10 DIAGNOSIS — M129 Arthropathy, unspecified: Secondary | ICD-10-CM | POA: Insufficient documentation

## 2012-03-10 DIAGNOSIS — E785 Hyperlipidemia, unspecified: Secondary | ICD-10-CM | POA: Insufficient documentation

## 2012-03-10 DIAGNOSIS — R509 Fever, unspecified: Secondary | ICD-10-CM | POA: Diagnosis not present

## 2012-03-10 HISTORY — DX: Unspecified osteoarthritis, unspecified site: M19.90

## 2012-03-10 MED ORDER — ACETAMINOPHEN 325 MG PO TABS
650.0000 mg | ORAL_TABLET | Freq: Once | ORAL | Status: AC
  Administered 2012-03-10: 650 mg via ORAL
  Filled 2012-03-10: qty 2

## 2012-03-10 NOTE — ED Provider Notes (Signed)
History  Scribed for [employer]. Colon Branch, MD, the patient was seen in room APA04/APA04. This chart was scribed by Candelaria Stagers. The patient's care started at 11:25 PM   CSN: [id number]  Arrival date & time 03/10/12  2238   First MD Initiated Contact with Patient 03/10/12 2304      Chief Complaint  Patient presents with  . Fever  . Emesis     The history is provided by the patient.   Lori [name] is a 78 y.o. female who presents to the Emergency Department complaining of emesis and fever that started this evening.  Pt reports that yesterday she took prednisone for a rash and began experiencing tremors.  She states that she also experienced tremors this morning after taking prednisone.  She is febrile.  She denies cough, dysuria, or diarrhea.  She reports that recently she has experienced problems with her kidneys.   Her PCP is [facility] Past Medical History  Diagnosis Date  . Carotid artery stenosis     Left carotid endarterectomy 02/2001  . Hypertension   . Hyperlipidemia   . Thyroid disease   . CAD (coronary artery disease)      Three-vessel coronary artery disease, status post percutaneous coronary intervention of the lesion in the distal circumflex artery  with a Promus drug-eluting stent.  Status post percutaneous coronary intervention of the lesion in the second marginal branchof the circumflex artery with a Promus drug-eluting stent.   . Arthritis     Past Surgical History  Procedure Date  . Carotid endarterectomy     left 02/2001    No family history on file.  History  Substance Use Topics  . Smoking status: Never Smoker   . Smokeless tobacco: Not on file  . Alcohol Use: No    OB History    Grav Para Term Preterm Abortions TAB SAB Ect Mult Living                  Review of Systems  Constitutional: Positive for fever and chills. Negative for activity change, appetite change and fatigue.  HENT: Negative for neck pain and neck  stiffness.   Eyes: Negative for pain and discharge.  Respiratory: Negative for cough and shortness of breath.   Cardiovascular: Positive for chest pain.  Gastrointestinal: Positive for nausea, vomiting and abdominal pain. Negative for diarrhea.  Genitourinary: Positive for urgency and frequency. Negative for dysuria.  Musculoskeletal: Negative for myalgias and arthralgias.  Skin: Positive for rash.    Allergies  Review of patient's allergies indicates no known allergies.  Home Medications   Current Outpatient Rx  Name Route Sig Dispense Refill  . AMLODIPINE-OLMESARTAN 5-20 MG PO TABS Oral Take 1 tablet by mouth daily. 30 tablet 11  . ASPIRIN 81 MG PO TABS Oral Take 81 mg by mouth daily.      [name] Kitchen CLOPIDOGREL BISULFATE 75 MG PO TABS  TAKE ONE TABLET BY MOUTH ONE TIME DAILY 30 tablet 10  . LEVOTHYROXINE SODIUM 50 MCG PO TABS Oral Take 50 mcg by mouth daily.      [name] Kitchen SIMVASTATIN 20 MG PO TABS Oral Take 1 tablet (20 mg total) by mouth at bedtime. 30 tablet 6    BP 115/53  Pulse 108  Temp 102.8 F (39.3 C) (Oral)  Resp 28  SpO2 94%  Physical Exam  Nursing note and vitals reviewed. Constitutional: She is oriented to person, place, and time. She appears well-developed and well-nourished. No distress.  HENT:  Head: Normocephalic and atraumatic.  Eyes: EOM are normal. Pupils are equal, round, and reactive to light.  Neck: Neck supple. No tracheal deviation present.  Cardiovascular: Normal rate and normal heart sounds.  Exam reveals no gallop and no friction rub.   No murmur heard. Pulmonary/Chest: Effort normal. No respiratory distress. She has no wheezes. She has no rales.  Abdominal: Soft. She exhibits no distension.  Musculoskeletal: Normal range of motion. She exhibits no edema.       No peripheral edema  Neurological: She is alert and oriented to person, place, and time. No sensory deficit.  Skin: Skin is warm and dry.       Diffuse red rash with excoriation over her chest back  arms and legs.  Hardening of the skin associated with this rash.    Psychiatric: She has a normal mood and affect. Her behavior is normal.    ED Course  Procedures   DIAGNOSTIC STUDIES: Oxygen Saturation is 94% on room air, normal by my interpretation.    COORDINATION OF CARE:  11:31PM Ordered: DG Chest Port 1 View; Urinalysis; CBC; Differential; Comprehensive metabolic panel.   Results for orders placed during the hospital encounter of 03/10/12  CBC      Component Value Range   WBC 13.7 (*) 4.0 - 10.5 K/uL   RBC 3.75 (*) 3.87 - 5.11 MIL/uL   Hemoglobin 11.1 (*) 12.0 - 15.0 g/dL   HCT 16.1 (*) 09.6 - 04.5 %   MCV 87.7  78.0 - 100.0 fL   MCH 29.6  26.0 - 34.0 pg   MCHC 33.7  30.0 - 36.0 g/dL   RDW 40.9  81.1 - 91.4 %   Platelets 164  150 - 400 K/uL  DIFFERENTIAL      Component Value Range   Neutrophils Relative 93 (*) 43 - 77 %   Neutro Abs 12.7 (*) 1.7 - 7.7 K/uL   Lymphocytes Relative 3 (*) 12 - 46 %   Lymphs Abs 0.4 (*) 0.7 - 4.0 K/uL   Monocytes Relative 4  3 - 12 %   Monocytes Absolute 0.6  0.1 - 1.0 K/uL   Eosinophils Relative 0  0 - 5 %   Eosinophils Absolute 0.0  0.0 - 0.7 K/uL   Basophils Relative 0  0 - 1 %   Basophils Absolute 0.0  0.0 - 0.1 K/uL  COMPREHENSIVE METABOLIC PANEL      Component Value Range   Sodium 128 (*) 135 - 145 mEq/L   Potassium 3.7  3.5 - 5.1 mEq/L   Chloride 95 (*) 96 - 112 mEq/L   CO2 19  19 - 32 mEq/L   Glucose, Bld 104 (*) 70 - 99 mg/dL   BUN 27 (*) 6 - 23 mg/dL   Creatinine, Ser 7.82 (*) 0.50 - 1.10 mg/dL   Calcium 8.7  8.4 - 95.6 mg/dL   Total Protein 6.2  6.0 - 8.3 g/dL   Albumin 3.0 (*) 3.5 - 5.2 g/dL   AST 35  0 - 37 U/L   ALT 27  0 - 35 U/L   Alkaline Phosphatase 99  39 - 117 U/L   Total Bilirubin 0.4  0.3 - 1.2 mg/dL   GFR calc non Af Amer 39 (*) >90 mL/min   GFR calc Af Amer 45 (*) >90 mL/min  URINALYSIS, ROUTINE W REFLEX MICROSCOPIC      Component Value Range   Color, Urine YELLOW  YELLOW   APPearance CLEAR  CLEAR  Specific Gravity, Urine 1.020  1.005 - 1.030   pH 5.5  5.0 - 8.0   Glucose, UA NEGATIVE  NEGATIVE mg/dL   Hgb urine dipstick SMALL (*) NEGATIVE   Bilirubin Urine NEGATIVE  NEGATIVE   Ketones, ur NEGATIVE  NEGATIVE mg/dL   Protein, ur TRACE (*) NEGATIVE mg/dL   Urobilinogen, UA 0.2  0.0 - 1.0 mg/dL   Nitrite POSITIVE (*) NEGATIVE   Leukocytes, UA TRACE (*) NEGATIVE  URINE MICROSCOPIC-ADD ON      Component Value Range   Squamous Epithelial / LPF RARE  RARE   WBC, UA TOO NUMEROUS TO COUNT  <3 WBC/hpf   RBC / HPF 3-6  <3 RBC/hpf   Bacteria, UA MANY (*) RARE   Dg Chest Port 1 View  03/11/2012  *RADIOLOGY REPORT*  Clinical Data: Fever.  Hypertension.  Coronary artery disease.  PORTABLE CHEST - 1 VIEW  Comparison: 08/05/2010  Findings: Low lung volumes are seen with mild linear opacity in both lung bases consistent with subsegmental atelectasis.  No evidence of pulmonary consolidation or edema.  No evidence of pleural effusion.  Heart size is within normal limits allowing for low lung volumes and portable technique.  IMPRESSION: Low lung volumes with mild bibasilar atelectasis.  Original Report Authenticated By: Danae Orleans, M.D.     MDM  Patient with chills on 2 separate occasions over 24 hours associated with urinary frequency and urgency. Developed fever this evening. Given IV fluids, antiemetic, and Tylenol for the fever. Labs with urinary tract infection, elevated white count. Chest x-ray negative for acute process. Initiated antibiotic therapy.Pt stable in ED with no significant deterioration in condition.The patient appears reasonably screened and/or stabilized for discharge and I doubt any other medical condition or other [facility] requiring further screening, evaluation, or treatment in the ED at this time prior to discharge.  I personally performed the services described in this documentation, which was scribed in my presence. The recorded information has been reviewed and  considered.   MDM Reviewed: nursing note and vitals Interpretation: labs and x-ray           Nicoletta Dress. Colon Branch, MD 03/11/12 [id number]

## 2012-03-10 NOTE — ED Notes (Signed)
Patient was placed on Prednisone taper x 2 days for a rash.  Patient c/o vomiting with fever tonight; also c/o joint aches.

## 2012-03-11 LAB — COMPREHENSIVE METABOLIC PANEL
ALT: 27 U/L (ref 0–35)
Calcium: 8.7 mg/dL (ref 8.4–10.5)
GFR calc Af Amer: 45 mL/min — ABNORMAL LOW (ref >90)
Glucose, Bld: 104 mg/dL — ABNORMAL HIGH (ref 70–99)
Sodium: 128 mEq/L — ABNORMAL LOW (ref 135–145)
Total Protein: 6.2 g/dL (ref 6.0–8.3)

## 2012-03-11 LAB — URINALYSIS, ROUTINE W REFLEX MICROSCOPIC
Bilirubin Urine: NEGATIVE
Nitrite: POSITIVE — AB
Specific Gravity, Urine: 1.02 (ref 1.005–1.030)
pH: 5.5 (ref 5.0–8.0)

## 2012-03-11 LAB — CBC
MCH: 29.6 pg (ref 26.0–34.0)
MCV: 87.7 fL (ref 78.0–100.0)
Platelets: 164 10*3/uL (ref 150–400)
RBC: 3.75 MIL/uL — ABNORMAL LOW (ref 3.87–5.11)
RDW: 13.2 % (ref 11.5–15.5)

## 2012-03-11 LAB — URINE MICROSCOPIC-ADD ON

## 2012-03-11 LAB — DIFFERENTIAL
Eosinophils Absolute: 0 10*3/uL (ref 0.0–0.7)
Eosinophils Relative: 0 % (ref 0–5)
Lymphs Abs: 0.4 10*3/uL — ABNORMAL LOW (ref 0.7–4.0)

## 2012-03-11 MED ORDER — CIPROFLOXACIN IN D5W 400 MG/200ML IV SOLN
400.0000 mg | Freq: Once | INTRAVENOUS | Status: AC
  Administered 2012-03-11: 400 mg via INTRAVENOUS
  Filled 2012-03-11: qty 200

## 2012-03-11 MED ORDER — ONDANSETRON HCL 4 MG PO TABS: 4.0000 mg | ORAL_TABLET | Freq: Four times a day (QID) | ORAL | Status: DC

## 2012-03-11 MED ORDER — CIPROFLOXACIN HCL 500 MG PO TABS: 250.0000 mg | ORAL_TABLET | Freq: Two times a day (BID) | ORAL | Status: DC

## 2012-03-11 MED ORDER — SODIUM CHLORIDE 0.9 % IV BOLUS (SEPSIS)
500.0000 mL | INTRAVENOUS | Status: AC
  Administered 2012-03-11: 500 mL via INTRAVENOUS

## 2012-03-11 NOTE — ED Notes (Signed)
Unable to ambulate patient and she was unable to use the bedpan. Verbal order for in and out cath obtained from Dr Colon Branch.

## 2012-03-11 NOTE — ED Notes (Signed)
Pts o2 sats dropped to 90% on room air while she was sleeping/snoring. I placed pt on 2L via nasal cannula and 02 sats are now 96%.

## 2012-03-11 NOTE — Discharge Instructions (Signed)
Drink lots of fluids. Take all of the antibiotic. Use the nausea medicine as needed. Follow up with your doctor.   Urinary Tract Infection A urinary tract infection (UTI) is often caused by a germ (bacteria). A UTI is usually helped with medicine (antibiotics) that kills germs. Take all the medicine until it is gone. Do this even if you are feeling better. You are usually better in 7 to 10 days. HOME CARE   Drink enough water and fluids to keep your pee (urine) clear or pale yellow. Drink:   Cranberry juice.   Water.   Avoid:   Caffeine.   Tea.   Bubbly (carbonated) drinks.   Alcohol.   Only take medicine as told by your doctor.   To prevent further infections:   Pee often.   After pooping (bowel movement), women should wipe from front to back. Use each tissue only once.   Pee before and after having sex (intercourse).  Ask your doctor when your test results will be ready. Make sure you follow up and get your test results.  GET HELP RIGHT AWAY IF:   There is very bad back pain or lower belly (abdominal) pain.   You get the chills.   You have a fever.   Your baby is older than 3 months with a rectal temperature of 102 F (38.9 C) or higher.   Your baby is 31 months old or younger with a rectal temperature of 100.4 F (38 C) or higher.   You feel sick to your stomach (nauseous) or throw up (vomit).   There is continued burning with peeing.   Your problems are not better in 3 days. Return sooner if you are getting worse.  MAKE SURE YOU:   Understand these instructions.   Will watch your condition.   Will get help right away if you are not doing well or get worse.  Document Released: 02/24/2008 Document Revised: 08/27/2011 Document Reviewed: 02/24/2008 [facility] Patient Information 2012 [place], [place].

## 2012-03-12 ENCOUNTER — Emergency Department (HOSPITAL_COMMUNITY): Payer: Medicare Other

## 2012-03-12 ENCOUNTER — Inpatient Hospital Stay (HOSPITAL_COMMUNITY)
Admission: EM | Admit: 2012-03-12 | Discharge: 2012-03-15 | DRG: 280 | Disposition: A | Discharge status: Home Health | Payer: Medicare Other | Attending: Cardiology | Admitting: Cardiology

## 2012-03-12 ENCOUNTER — Encounter (HOSPITAL_COMMUNITY): Payer: Self-pay | Admitting: Emergency Medicine

## 2012-03-12 DIAGNOSIS — A419 Sepsis, unspecified organism: Secondary | ICD-10-CM | POA: Diagnosis not present

## 2012-03-12 DIAGNOSIS — E871 Hypo-osmolality and hyponatremia: Secondary | ICD-10-CM | POA: Diagnosis present

## 2012-03-12 DIAGNOSIS — N189 Chronic kidney disease, unspecified: Secondary | ICD-10-CM | POA: Diagnosis present

## 2012-03-12 DIAGNOSIS — Z79899 Other long term (current) drug therapy: Secondary | ICD-10-CM | POA: Diagnosis not present

## 2012-03-12 DIAGNOSIS — I129 Hypertensive chronic kidney disease with stage 1 through stage 4 chronic kidney disease, or unspecified chronic kidney disease: Secondary | ICD-10-CM | POA: Diagnosis present

## 2012-03-12 DIAGNOSIS — M129 Arthropathy, unspecified: Secondary | ICD-10-CM | POA: Diagnosis present

## 2012-03-12 DIAGNOSIS — Z7902 Long term (current) use of antithrombotics/antiplatelets: Secondary | ICD-10-CM | POA: Diagnosis not present

## 2012-03-12 DIAGNOSIS — I214 Non-ST elevation (NSTEMI) myocardial infarction: Principal | ICD-10-CM | POA: Diagnosis present

## 2012-03-12 DIAGNOSIS — I509 Heart failure, unspecified: Secondary | ICD-10-CM | POA: Diagnosis present

## 2012-03-12 DIAGNOSIS — I059 Rheumatic mitral valve disease, unspecified: Secondary | ICD-10-CM

## 2012-03-12 DIAGNOSIS — J9600 Acute respiratory failure, unspecified whether with hypoxia or hypercapnia: Secondary | ICD-10-CM | POA: Diagnosis present

## 2012-03-12 DIAGNOSIS — N390 Urinary tract infection, site not specified: Secondary | ICD-10-CM | POA: Diagnosis present

## 2012-03-12 DIAGNOSIS — J9601 Acute respiratory failure with hypoxia: Secondary | ICD-10-CM | POA: Diagnosis present

## 2012-03-12 DIAGNOSIS — E785 Hyperlipidemia, unspecified: Secondary | ICD-10-CM | POA: Diagnosis present

## 2012-03-12 DIAGNOSIS — I2510 Atherosclerotic heart disease of native coronary artery without angina pectoris: Secondary | ICD-10-CM | POA: Diagnosis not present

## 2012-03-12 DIAGNOSIS — E039 Hypothyroidism, unspecified: Secondary | ICD-10-CM | POA: Diagnosis present

## 2012-03-12 DIAGNOSIS — J189 Pneumonia, unspecified organism: Secondary | ICD-10-CM | POA: Diagnosis present

## 2012-03-12 DIAGNOSIS — Z9861 Coronary angioplasty status: Secondary | ICD-10-CM | POA: Diagnosis not present

## 2012-03-12 DIAGNOSIS — I5023 Acute on chronic systolic (congestive) heart failure: Secondary | ICD-10-CM | POA: Diagnosis present

## 2012-03-12 DIAGNOSIS — R52 Pain, unspecified: Secondary | ICD-10-CM | POA: Diagnosis not present

## 2012-03-12 DIAGNOSIS — N179 Acute kidney failure, unspecified: Secondary | ICD-10-CM | POA: Diagnosis not present

## 2012-03-12 DIAGNOSIS — J811 Chronic pulmonary edema: Secondary | ICD-10-CM | POA: Diagnosis present

## 2012-03-12 DIAGNOSIS — R918 Other nonspecific abnormal finding of lung field: Secondary | ICD-10-CM | POA: Diagnosis not present

## 2012-03-12 DIAGNOSIS — J9690 Respiratory failure, unspecified, unspecified whether with hypoxia or hypercapnia: Secondary | ICD-10-CM

## 2012-03-12 DIAGNOSIS — J90 Pleural effusion, not elsewhere classified: Secondary | ICD-10-CM | POA: Diagnosis not present

## 2012-03-12 DIAGNOSIS — R079 Chest pain, unspecified: Secondary | ICD-10-CM | POA: Diagnosis not present

## 2012-03-12 DIAGNOSIS — D72829 Elevated white blood cell count, unspecified: Secondary | ICD-10-CM

## 2012-03-12 HISTORY — DX: Pneumonia, unspecified organism: J18.9

## 2012-03-12 HISTORY — DX: Respiratory failure, unspecified, unspecified whether with hypoxia or hypercapnia: J96.90

## 2012-03-12 HISTORY — DX: Heart failure, unspecified: I50.9

## 2012-03-12 HISTORY — DX: Hypo-osmolality and hyponatremia: E87.1

## 2012-03-12 HISTORY — DX: Disorder of kidney and ureter, unspecified: N28.9

## 2012-03-12 LAB — COMPREHENSIVE METABOLIC PANEL
ALT: 51 U/L — ABNORMAL HIGH (ref 0–35)
AST: 60 U/L — ABNORMAL HIGH (ref 0–37)
Alkaline Phosphatase: 92 U/L (ref 39–117)
CO2: 22 mEq/L (ref 19–32)
Chloride: 95 mEq/L — ABNORMAL LOW (ref 96–112)
GFR calc Af Amer: 36 mL/min — ABNORMAL LOW (ref >90)
GFR calc non Af Amer: 31 mL/min — ABNORMAL LOW (ref >90)
Glucose, Bld: 141 mg/dL — ABNORMAL HIGH (ref 70–99)
Potassium: 4.6 mEq/L (ref 3.5–5.1)
Sodium: 128 mEq/L — ABNORMAL LOW (ref 135–145)
Total Bilirubin: 0.3 mg/dL (ref 0.3–1.2)

## 2012-03-12 LAB — BASIC METABOLIC PANEL
CO2: 19 mEq/L (ref 19–32)
Glucose, Bld: 204 mg/dL — ABNORMAL HIGH (ref 70–99)
Potassium: 3.9 mEq/L (ref 3.5–5.1)
Sodium: 129 mEq/L — ABNORMAL LOW (ref 135–145)

## 2012-03-12 LAB — CBC
HCT: 30.6 % — ABNORMAL LOW (ref 36.0–46.0)
Hemoglobin: 11.7 g/dL — ABNORMAL LOW (ref 12.0–15.0)
Hemoglobin: 10.3 g/dL — ABNORMAL LOW (ref 12.0–15.0)
MCHC: 33.7 g/dL (ref 30.0–36.0)
MCV: 87.2 fL (ref 78.0–100.0)
Platelets: 186 10*3/uL (ref 150–400)
RBC: 3.98 MIL/uL (ref 3.87–5.11)
RDW: 13.8 % (ref 11.5–15.5)
WBC: 33.3 10*3/uL — ABNORMAL HIGH (ref 4.0–10.5)

## 2012-03-12 LAB — POCT I-STAT 3, ART BLOOD GAS (G3+)
Acid-base deficit: 6 mmol/L — ABNORMAL HIGH (ref 0.0–2.0)
pH, Arterial: 7.28 — ABNORMAL LOW (ref 7.350–7.400)

## 2012-03-12 LAB — DIFFERENTIAL
Basophils Absolute: 0 10*3/uL (ref 0.0–0.1)
Basophils Relative: 0 % (ref 0–1)
Eosinophils Relative: 0 % (ref 0–5)
Lymphocytes Relative: 5 % — ABNORMAL LOW (ref 12–46)
Monocytes Absolute: 3.6 10*3/uL — ABNORMAL HIGH (ref 0.1–1.0)
Monocytes Relative: 13 % — ABNORMAL HIGH (ref 3–12)
Neutro Abs: 23.6 10*3/uL — ABNORMAL HIGH (ref 1.7–7.7)

## 2012-03-12 LAB — HEPARIN LEVEL (UNFRACTIONATED): Heparin Unfractionated: 0.28 IU/mL — ABNORMAL LOW (ref 0.30–0.70)

## 2012-03-12 LAB — CARDIAC PANEL(CRET KIN+CKTOT+MB+TROPI)
CK, MB: 14.6 ng/mL (ref 0.3–4.0)
CK, MB: 17.6 ng/mL (ref 0.3–4.0)
Relative Index: 13.7 — ABNORMAL HIGH (ref 0.0–2.5)
Relative Index: 14.9 — ABNORMAL HIGH (ref 0.0–2.5)
Relative Index: 13.1 — ABNORMAL HIGH (ref 0.0–2.5)
Total CK: 132 U/L (ref 7–177)
Total CK: 118 U/L (ref 7–177)
Total CK: 120 U/L (ref 7–177)
Troponin I: 4.45 ng/mL (ref <0.30)

## 2012-03-12 LAB — LACTIC ACID, PLASMA: Lactic Acid, Venous: 1.7 mmol/L (ref 0.5–2.2)

## 2012-03-12 LAB — PRO B NATRIURETIC PEPTIDE: Pro B Natriuretic peptide (BNP): 10177 pg/mL — ABNORMAL HIGH (ref 0–450)

## 2012-03-12 LAB — STREP PNEUMONIAE URINARY ANTIGEN: Strep Pneumo Urinary Antigen: NEGATIVE

## 2012-03-12 LAB — PROCALCITONIN: Procalcitonin: 32.64 ng/mL

## 2012-03-12 LAB — PROTIME-INR: Prothrombin Time: 14.5 seconds (ref 11.6–15.2)

## 2012-03-12 LAB — D-DIMER, QUANTITATIVE: D-Dimer, Quant: 2.69 ug/mL-FEU — ABNORMAL HIGH (ref 0.00–0.48)

## 2012-03-12 MED ORDER — FUROSEMIDE 8 MG/ML PO SOLN
40.0000 mg | Freq: Two times a day (BID) | ORAL | Status: DC
  Administered 2012-03-12 – 2012-03-13 (×3): 40 mg via ORAL
  Filled 2012-03-12 (×6): qty 5

## 2012-03-12 MED ORDER — HEPARIN BOLUS VIA INFUSION
4000.0000 [IU] | Freq: Once | INTRAVENOUS | Status: AC
  Administered 2012-03-12: 4000 [IU] via INTRAVENOUS
  Filled 2012-03-12: qty 4000

## 2012-03-12 MED ORDER — LEVOTHYROXINE SODIUM 50 MCG PO TABS
50.0000 ug | ORAL_TABLET | Freq: Every day | ORAL | Status: DC
  Administered 2012-03-12 – 2012-03-15 (×4): 50 ug via ORAL
  Filled 2012-03-12 (×4): qty 1

## 2012-03-12 MED ORDER — FUROSEMIDE 10 MG/ML IJ SOLN
INTRAMUSCULAR | Status: AC
  Administered 2012-03-12: 40 mg
  Filled 2012-03-12: qty 4

## 2012-03-12 MED ORDER — PIPERACILLIN-TAZOBACTAM 3.375 G IVPB
3.3750 g | Freq: Once | INTRAVENOUS | Status: AC
  Administered 2012-03-12: 3.375 g via INTRAVENOUS
  Filled 2012-03-12: qty 50

## 2012-03-12 MED ORDER — ONDANSETRON HCL 4 MG/2ML IJ SOLN: 4.0000 mg | Freq: Four times a day (QID) | INTRAMUSCULAR | Status: DC | PRN

## 2012-03-12 MED ORDER — NITROGLYCERIN IN D5W 200-5 MCG/ML-% IV SOLN
INTRAVENOUS | Status: AC
  Filled 2012-03-12: qty 250

## 2012-03-12 MED ORDER — ALBUTEROL SULFATE (5 MG/ML) 0.5% IN NEBU
5.0000 mg | INHALATION_SOLUTION | Freq: Once | RESPIRATORY_TRACT | Status: AC
  Administered 2012-03-12: 5 mg via RESPIRATORY_TRACT
  Filled 2012-03-12: qty 1

## 2012-03-12 MED ORDER — ACETAMINOPHEN 325 MG PO TABS: 650.0000 mg | ORAL_TABLET | ORAL | Status: DC | PRN

## 2012-03-12 MED ORDER — SODIUM CHLORIDE 0.9 % IJ SOLN
3.0000 mL | Freq: Two times a day (BID) | INTRAMUSCULAR | Status: DC
Start: 2012-03-12 — End: 2012-03-15
  Administered 2012-03-12 – 2012-03-15 (×7): 3 mL via INTRAVENOUS

## 2012-03-12 MED ORDER — VANCOMYCIN HCL IN DEXTROSE 1-5 GM/200ML-% IV SOLN
1000.0000 mg | Freq: Once | INTRAVENOUS | Status: AC
  Administered 2012-03-12: 1000 mg via INTRAVENOUS
  Filled 2012-03-12: qty 200

## 2012-03-12 MED ORDER — SODIUM CHLORIDE 0.9 % IJ SOLN: 3.0000 mL | INTRAMUSCULAR | Status: DC | PRN

## 2012-03-12 MED ORDER — NITROGLYCERIN IN D5W 200-5 MCG/ML-% IV SOLN: 3.0000 ug/min | INTRAVENOUS | Status: DC

## 2012-03-12 MED ORDER — ASPIRIN 81 MG PO TABS: 81.0000 mg | ORAL_TABLET | Freq: Every day | ORAL | Status: DC

## 2012-03-12 MED ORDER — SODIUM CHLORIDE 0.9 % IV SOLN: 250.0000 mL | INTRAVENOUS | Status: DC | PRN

## 2012-03-12 MED ORDER — CLOPIDOGREL BISULFATE 75 MG PO TABS
75.0000 mg | ORAL_TABLET | Freq: Once | ORAL | Status: AC
  Administered 2012-03-12: 75 mg via ORAL
  Filled 2012-03-12: qty 1

## 2012-03-12 MED ORDER — ASPIRIN EC 81 MG PO TBEC
81.0000 mg | DELAYED_RELEASE_TABLET | Freq: Every day | ORAL | Status: AC
  Administered 2012-03-12: 81 mg via ORAL
  Filled 2012-03-12: qty 1

## 2012-03-12 MED ORDER — ASPIRIN EC 81 MG PO TBEC
81.0000 mg | DELAYED_RELEASE_TABLET | Freq: Every day | ORAL | Status: DC
  Administered 2012-03-13 – 2012-03-15 (×3): 81 mg via ORAL
  Filled 2012-03-12 (×3): qty 1

## 2012-03-12 MED ORDER — HEPARIN (PORCINE) IN NACL 100-0.45 UNIT/ML-% IJ SOLN
1300.0000 [IU]/h | INTRAMUSCULAR | Status: DC
  Administered 2012-03-12: 850 [IU]/h via INTRAVENOUS
  Administered 2012-03-13: 1150 [IU]/h via INTRAVENOUS
  Filled 2012-03-12 (×3): qty 250

## 2012-03-12 MED ORDER — NITROGLYCERIN 0.4 MG SL SUBL: 0.4000 mg | SUBLINGUAL_TABLET | SUBLINGUAL | Status: DC | PRN

## 2012-03-12 MED ORDER — DEXTROSE 5 % IV SOLN
500.0000 mg | INTRAVENOUS | Status: DC
  Administered 2012-03-12 – 2012-03-13 (×2): 500 mg via INTRAVENOUS
  Filled 2012-03-12 (×2): qty 500

## 2012-03-12 MED ORDER — SODIUM CHLORIDE 0.9 % IV BOLUS (SEPSIS)
500.0000 mL | Freq: Once | INTRAVENOUS | Status: AC
  Administered 2012-03-12: 500 mL via INTRAVENOUS

## 2012-03-12 MED ORDER — DEXTROSE 5 % IV SOLN
1.0000 g | INTRAVENOUS | Status: DC
  Administered 2012-03-12 – 2012-03-13 (×2): 1 g via INTRAVENOUS
  Filled 2012-03-12 (×4): qty 10

## 2012-03-12 NOTE — H&P (Signed)
CARDIOLOGY ADMISSION NOTE  Patient ID: LAKETHA LEOPARD MRN: [id number] DOB/AGE: 76-Nov-1920 76 y.o.  Admit date: 03/12/2012 Primary Physician Horald Pollen., PA Primary Cardiologist Dr. Antoine Poche Chief Complaint  Chest pain/Dyspnea  HPI:   The patient has a history of CAD as below.  She has had a rash and has been taking some steroids recently for this.  On Wed she developed a fever to 103 and severe chills.  She was seen in an ER and was diagnosed with UTI and started on ciprofloxacin. She has continued to not feel well over a few days. This evening she went to bed. She woke around 3:00 with chest discomfort. She became acutely short of breath and was brought to the emergency room.  She is found to have a troponin of 4.45 with a total CK of 132 and MB of 14.6.  Pro BNP [place] elevated at 101[address].  She was initially called a code STEMI.  However on presentation she did not have acute ST elevation.  She was not taken to the lab.  She does have sinus tachycardia with old anterior MI and mild inferolateral ST depression.  She was initially very hypertensive. She was treated with IV nitroglycerin.  She was found to have a markedly elevated white blood cell count.  However, again she's been on steroids. She's been treated with albuterol, vancomycin and Zosyn. She's currently pain free and breathing better.  She had been feeling relatively well last week and prior to these events. She's not been having any chest pain, cough fevers or chills. She's not been having any new shortness of breath, PND or orthopnea. She gets around slowly with a cane.  Past Medical History  Diagnosis Date  . Carotid artery stenosis     Left carotid endarterectomy 02/2001  . Hypertension   . Hyperlipidemia   . Thyroid disease   . CAD (coronary artery disease)      Three-vessel coronary artery disease, status post percutaneous coronary intervention of the lesion in the distal circumflex artery  with a Promus drug-eluting stent.   Status post percutaneous coronary intervention of the lesion in the second marginal branchof the circumflex artery with a Promus drug-eluting stent.   . Arthritis     Past Surgical History  Procedure Date  . Carotid endarterectomy     left 02/2001    No Known Allergies   Current Outpatient Prescriptions on File Prior to Encounter  Medication Sig Dispense Refill  . amLODipine-olmesartan (AZOR) 5-20 MG per tablet Take 1 tablet by mouth daily.  30 tablet  11  . aspirin 81 MG tablet Take 81 mg by mouth daily.        . ciprofloxacin (CIPRO) 500 MG tablet Take 0.5 tablets (250 mg total) by mouth 2 (two) times daily.  10 tablet  0  . clopidogrel (PLAVIX) 75 MG tablet TAKE ONE TABLET BY MOUTH ONE TIME DAILY  30 tablet  10  . levothyroxine (SYNTHROID, LEVOTHROID) 50 MCG tablet Take 50 mcg by mouth daily.         Prednisone taper  Started Wed    . ondansetron (ZOFRAN) 4 MG tablet Take 1 tablet (4 mg total) by mouth every 6 (six) hours.  12 tablet  0     Family History  Problem Relation Age of Onset  . Prostate cancer    . Heart failure Mother 22  . Coronary artery disease Brother 34    CABG, alive at 68  . Coronary artery disease Brother 76  Died  . Heart failure Sister   . Heart failure Sister     History   Social History  . Marital Status: Married    Spouse Name: N/A    Number of Children: N/A  . Years of Education: N/A   Occupational History  . RETIRED     [employer]   Social History Main Topics  . Smoking status: Never Smoker   . Smokeless tobacco: Not on file  . Alcohol Use: No  . Drug Use: No  . Sexually Active: Not on file   Other Topics Concern  . Not on file   Social History Narrative   Lives at home with husband.       ROS:  As stated in the HPI and negative for all other systems.  Physical Exam: Blood pressure 110/56, pulse 77, temperature 97.5 F (36.4 C), temperature source Rectal, resp. rate 10, SpO2 100.00%.  GENERAL:  Well appearing for  her age, currently no acute distress HEENT:  Pupils equal round and reactive, fundi not visualized, oral mucosa unremarkable NECK:  No jugular venous distention, waveform within normal limits, carotid upstroke brisk and symmetric, no bruits, no thyromegaly LYMPHATICS:  No cervical, inguinal adenopathy LUNGS:  Diffuse crackles BACK:  No CVA tenderness CHEST:  Unremarkable HEART:  PMI not displaced or sustained,S1 and S2 within normal limits, no S3, no S4, no clicks, no rubs, no murmurs ABD:  Flat, positive bowel sounds normal in frequency in pitch, no bruits, no rebound, no guarding, no midline pulsatile mass, no hepatomegaly, no splenomegaly EXT:  2 plus pulses upper, diminished lower, no cyanosis no clubbing SKIN:  Diffuse rash chest and back,  no nodules NEURO:  Cranial nerves II through XII grossly intact, motor grossly intact throughout PSYCH:  Cognitively intact, oriented to person place and time   Labs: Lab Results  Component Value Date   BUN 31* 03/12/2012   Lab Results  Component Value Date   CREATININE 1.24* 03/12/2012   Lab Results  Component Value Date   NA 129* 03/12/2012   K 3.9 03/12/2012   CL 95* 03/12/2012   CO2 19 03/12/2012   Lab Results  Component Value Date   CKTOTAL 132 03/12/2012   CKMB 14.6* 03/12/2012   TROPONINI 4.45* 03/12/2012   Lab Results  Component Value Date   WBC 33.3* 03/12/2012   HGB 11.7* 03/12/2012   HCT 34.7* 03/12/2012   MCV 87.2 03/12/2012   PLT 186 03/12/2012    Lab Results  Component Value Date   ALT 27 03/10/2012   AST 35 03/10/2012   ALKPHOS 99 03/10/2012   BILITOT 0.4 03/10/2012    Radiology:   CXR:  Vascular congestion, with bilateral central and bibasilar airspace  opacities, compatible with pulmonary edema. Small bilateral  pleural effusions seen.   ZOX:[id number] tachycardia, rate 118, axis within normal limits, intervals within normal limits, old anteroseptal microinfarction, premature atrial contractions, inferolateral ST depression  possible ischemia  ASSESSMENT AND PLAN:   1)  Respiratory failure:  I suspect the primary problem is cardiogenic with a non-Q-wave microinfarction and heart failure. She's also been treated for sepsis by the ER. She was managed with IV nitrates and low-dose Lasix. I transitioned her from the BiPAP that she's on to nasal cannula. Will cycle cardiac enzymes. As she is pain-free should not be taken urgently to the cath lab. I did discuss this with her family. We might consider this electively motor or urgently if she has no acute symptoms.  I will continue the antibiotics.  Pulmonary has been consulted.  2)  CAD:  As above  3)  UTI:  This was recent.  I will repeat a UA  4)  Hypothyroid:  Check TSH and continue synthroid.  SignedRollene Rotunda 03/12/2012, 7:17 AM

## 2012-03-12 NOTE — Progress Notes (Signed)
  Echocardiogram 2D Echocardiogram has been performed.  Lori [name] 03/12/2012, 2:46 PM 

## 2012-03-12 NOTE — ED Notes (Signed)
MD at bedside. 

## 2012-03-12 NOTE — ED Notes (Signed)
PT. ARRIVED WITH EMS FROM HOME , REPORTS MID CHEST PAIN RADIATING TO LEFT ARM / LEFT NECK WITH SOB AND DIAPHORESIS. PT. RECEIVED 2 NTG SL , ZOFRAN 4 MG IV , MORPHINE 10 MG IV AND ASPIRIN 324 MG BY EMS PTA.

## 2012-03-12 NOTE — Progress Notes (Signed)
ANTICOAGULATION CONSULT NOTE - Initial Consult  Pharmacy Consult for heparin Indication: chest pain/ACS  No Known Allergies  Patient Measurements:   Heparin Dosing Weight: 70.5kg  Vital Signs: Temp: 97.5 F (36.4 C) (06/22 0413) Temp src: Rectal (06/22 0413) BP: 118/66 mmHg (06/22 0700) Pulse Rate: 79  (06/22 0700)  Labs:  Lori [name] 03/12/12 0418 03/12/12 0417 03/10/12 2334  HGB -- 11.7* 11.1*  HCT -- 34.7* 32.9*  PLT -- 186 164  APTT -- -- --  LABPROT -- -- --  INR -- -- --  HEPARINUNFRC -- -- --  CREATININE -- 1.24* 1.19*  CKTOTAL 132 -- --  CKMB 14.6* -- --  TROPONINI 4.45* -- --    The CrCl is unknown because both a height and weight (above a minimum accepted value) are required for this calculation.   Medical History: Past Medical History  Diagnosis Date  . Carotid artery stenosis     Left carotid endarterectomy 02/2001  . Hypertension   . Hyperlipidemia   . Thyroid disease   . CAD (coronary artery disease)      Three-vessel coronary artery disease, status post percutaneous coronary intervention of the lesion in the distal circumflex artery  with a Promus drug-eluting stent.  Status post percutaneous coronary intervention of the lesion in the second marginal branchof the circumflex artery with a Promus drug-eluting stent.   . Arthritis     Medications:  ASA 81mg  Plavix 75mg  PTA (last dose 6/22)  Assessment: 89 yoF to start on IV heparin for NSTEMI.  Hgb, plts ok.  No bleeding noted.  No hx stroke in chart, though pt states she may have had a "mini" stroke years ago.  Estimated CrCl ~ 33 ml/min.  Baseline aPPT, PT/INR pending.  Per pt, weight is 70.5kg, height 5'5".  Goal of Therapy:  Heparin level 0.3-0.7 units/ml Monitor platelets by anticoagulation protocol: Yes   Plan:  1.  Bolus IV heparin 4000 units x 1 2.  IV heparin gtt @ 850 units/hr.  3.  F/u 8 hour heparin level @ 1900 4.  Daily heparin, CBC levels.   Lori [name] E 03/12/2012,9:33  AM

## 2012-03-12 NOTE — ED Provider Notes (Signed)
History     CSN: [id number]  Arrival date & time 03/12/12  0346   First MD Initiated Contact with Patient 03/12/12 [phone]      Chief Complaint  Patient presents with  . Chest Pain    (Consider location/radiation/quality/duration/timing/severity/associated sxs/prior treatment) HPI A LEVEL 5 CAVEAT PERTAINS DUE TO URGENT NEED FOR INTERVENTION Pt presents with acute onset of sob and chest pain approx 2am this morning.  She was transported via EMS as a code stemi activiation.  En route she received nitroglycerin and morphine without any change in her pain.  Pt significantly sob and diaphoretic upon arrival.    Past Medical History  Diagnosis Date  . Carotid artery stenosis     Left carotid endarterectomy 02/2001  . Hypertension   . Hyperlipidemia   . Thyroid disease   . CAD (coronary artery disease)      Three-vessel coronary artery disease, status post percutaneous coronary intervention of the lesion in the distal circumflex artery  with a Promus drug-eluting stent.  Status post percutaneous coronary intervention of the lesion in the second marginal branchof the circumflex artery with a Promus drug-eluting stent.   . Arthritis     Past Surgical History  Procedure Date  . Carotid endarterectomy     left 02/2001    Family History  Problem Relation Age of Onset  . Prostate cancer    . Heart failure Mother 50  . Coronary artery disease Brother 77    CABG, alive at 44  . Coronary artery disease Brother 21    Died  . Heart failure Sister   . Heart failure Sister     History  Substance Use Topics  . Smoking status: Never Smoker   . Smokeless tobacco: Not on file  . Alcohol Use: No    OB History    Grav Para Term Preterm Abortions TAB SAB Ect Mult Living                  Review of Systems UNABLE TO OBTAIN ROS DUE TO LEVEL 5 CAVEAT  Allergies  Review of patient's allergies indicates no known allergies.  Home Medications   Current Outpatient Rx  Name Route Sig  Dispense Refill  . AMLODIPINE-OLMESARTAN 5-20 MG PO TABS Oral Take 1 tablet by mouth daily. 30 tablet 11  . ASPIRIN 81 MG PO TABS Oral Take 81 mg by mouth daily.      [name] Kitchen CIPROFLOXACIN HCL 500 MG PO TABS Oral Take 0.5 tablets (250 mg total) by mouth 2 (two) times daily. 10 tablet 0  . CLOPIDOGREL BISULFATE 75 MG PO TABS  TAKE ONE TABLET BY MOUTH ONE TIME DAILY 30 tablet 10  . LEVOTHYROXINE SODIUM 50 MCG PO TABS Oral Take 50 mcg by mouth daily.      [name] Kitchen ONDANSETRON HCL 4 MG PO TABS Oral Take 1 tablet (4 mg total) by mouth every 6 (six) hours. 12 tablet 0  . SIMVASTATIN 20 MG PO TABS Oral Take 1 tablet (20 mg total) by mouth at bedtime. 30 tablet 6    BP 118/66  Pulse 79  Temp 97.5 F (36.4 C) (Rectal)  Resp 19  SpO2 100% Vitals reviewed Physical Exam Physical Examination: General appearance - awake, ill appearing, and in distress Mental status - awake, answering questions, but unable to assess orientation Eyes - pupils equal and reactive, no conjunctival injection Mouth - mucous membranes moist, pharynx normal without lesions Chest - coarse rhonchi throughout bilateral lung fields, BSS, tachypneic with  increased work of breathing Heart - tachycardic, regular rhythm, normal S1, S2, no murmurs, rubs, clicks or gallops Abdomen - soft, nontender, nondistended, no masses or organomegaly Neurological - alert, oriented, normal speech, no focal findings Extremities - peripheral pulses normal, no pedal edema, no clubbing or cyanosis Skin - normal coloration and turgor, no rashes, diaphoretic  ED Course  Procedures (including critical care time)   Date: 03/12/2012  Rate: 118  Rhythm: sinus tachycardia  QRS Axis: normal  Intervals: normal  ST/T Wave abnormalities: ST depressions noted inferiorly  Conduction Disutrbances:none  Narrative Interpretation:   Old EKG Reviewed: changes noted   6:29 AM  Discussed with Dr. Antoine Poche- he will see patient in ED for consultation.  Awaiting call back  from critical care.   6:49 AM d/w Critical care medicine- [facility]- they will send someone to the ED for consultation.  BP has responded to fluid bolus SBP 110  7:28 AM pt weaned from BIPAP- she is now breathing comfortably on [place] O2, HR, BP and RR are all improved.   CRITICAL CARE Performed by: Ethelda Chick   Total critical care time: 50 Critical care time was exclusive of separately billable procedures and treating other patients.  Critical care was necessary to treat or prevent imminent or life-threatening deterioration.  Critical care was time spent personally by me on the following activities: development of treatment plan with patient and/or surrogate as well as nursing, discussions with consultants, evaluation of patient's response to treatment, examination of patient, obtaining history from patient or surrogate, ordering and performing treatments and interventions, ordering and review of laboratory studies, ordering and review of radiographic studies, pulse oximetry and re-evaluation of patient's condition.  Labs Reviewed  CBC - Abnormal; Notable for the following:    WBC 33.3 (*)     Hemoglobin 11.7 (*)     HCT 34.7 (*)     All other components within normal limits  BASIC METABOLIC PANEL - Abnormal; Notable for the following:    Sodium 129 (*)     Chloride 95 (*)     Glucose, Bld 204 (*)     BUN 31 (*)     Creatinine, Ser 1.24 (*)     GFR calc non Af Amer 37 (*)     GFR calc Af Amer 43 (*)     All other components within normal limits  CARDIAC PANEL(CRET KIN+CKTOT+MB+TROPI) - Abnormal; Notable for the following:    CK, MB 14.6 (*)     Troponin I 4.45 (*)     Relative Index 11.1 (*)     All other components within normal limits  D-DIMER, QUANTITATIVE - Abnormal; Notable for the following:    D-Dimer, Quant 2.69 (*)     All other components within normal limits  PRO B NATRIURETIC PEPTIDE - Abnormal; Notable for the following:    Pro B Natriuretic peptide (BNP) 10177.0  (*)     All other components within normal limits  POCT I-STAT 3, BLOOD GAS (G3+) - Abnormal; Notable for the following:    pH, Arterial 7.280 (*)     pO2, Arterial 166.0 (*)     Acid-base deficit 6.0 (*)     All other components within normal limits  LACTIC ACID, PLASMA  PROCALCITONIN  CULTURE, BLOOD (ROUTINE X 2)  CULTURE, BLOOD (ROUTINE X 2)  URINE CULTURE  BLOOD GAS, ARTERIAL   Dg Chest Port 1 View  03/12/2012  *RADIOLOGY REPORT*  Clinical Data: Chest pain and respiratory distress.  PORTABLE  CHEST - 1 VIEW  Comparison: Chest radiograph performed 03/10/2012  Findings: The lungs are well-aerated.  Vascular congestion is noted, with bilateral central and bibasilar airspace opacities, compatible with pulmonary edema.  This is new from the prior study. Small bilateral pleural effusions are seen.  No pneumothorax is identified.  The cardiomediastinal silhouette is normal in size; calcification is noted within the aortic arch.  No acute osseous abnormalities are seen.  IMPRESSION: Vascular congestion, with bilateral central and bibasilar airspace opacities, compatible with pulmonary edema.  Small bilateral pleural effusions seen.  Original Report Authenticated By: Tonia Ghent, M.D.   Dg Chest Port 1 View  03/11/2012  *RADIOLOGY REPORT*  Clinical Data: Fever.  Hypertension.  Coronary artery disease.  PORTABLE CHEST - 1 VIEW  Comparison: 08/05/2010  Findings: Low lung volumes are seen with mild linear opacity in both lung bases consistent with subsegmental atelectasis.  No evidence of pulmonary consolidation or edema.  No evidence of pleural effusion.  Heart size is within normal limits allowing for low lung volumes and portable technique.  IMPRESSION: Low lung volumes with mild bibasilar atelectasis.  Original Report Authenticated By: Danae Orleans, M.D.     1. Respiratory failure   2. NSTEMI (non-ST elevated myocardial infarction)   3. Leukocytosis   4. Sepsis       MDM  Pt presents  with chest pain and respiratory distress.  Seen immediately upon arrival by me- code stemi activation- no st elevations on ekg- code stemi cancelled per me and Dr. Antoine Poche.  IV access, pt placed on monitor- pt with distress on NRB face mask O2, so placed on BIpap which significantly improved her work of breathing.  Pt started on nitro drip initally due to chest pain- then BP decreased to SPB 90s- CXR appears c/w pulmonary edema, but may have some component of pneumonia- also with WBC elevated in 30s (pt has been on steroids but also recently febrile to 103)- also procalcitonin elevated which is most c/w sepsis.  Unclear whether etiology is primarily cardiac versus sepsis/infection.  Pt started on broad spectrum abx- d/w Dr.  Antoine Poche as well as PCCM- pt chest pain free and much more comfortable after bipap.  Brief hypotension responded to fluid bolus- pt admitted for further management and evaluation.         Ethelda Chick, MD 03/12/12 [phone]

## 2012-03-12 NOTE — Consult Note (Signed)
PULMONARY/CCM CONSULT NOTE  Requesting MD/Service: Hochrein/Cards Date of admission: 6/22 Date of consult: 6/22 Reason for consultation: Acute respiratory failure  Pt Profile: Lori [name] presented to [facility] ED 6/22 AM with CP, resp distress with CXR c/w edema vs PNA. Trop I postive. PCT elevated. Transiently on BiPAP in ED. Improved with diuresis  Patient Active Problem List  Diagnosis  . HYPERTENSION  . CORONARY ATHEROSCLEROSIS NATIVE CORONARY ARTERY  . CAROTID STENOSIS  . Dyslipidemia  . Tachycardia  . Acute respiratory failure with hypoxia  . Pulmonary edema  . NSTEMI (non-ST elevated myocardial infarction)  . Pneumonia     HPI: Awoke on morning of admission with symptoms as above. Has had recent fever and recently treated with prednisone for rash and Cipro for UTI. Denies pleurodynia, hemoptysis, purulent secretions. Much improved after Lasix and NTG. Abx and BDs given in ED   Past Medical History  Diagnosis Date  . Carotid artery stenosis     Left carotid endarterectomy 02/2001  . Hypertension   . Hyperlipidemia   . Thyroid disease   . CAD (coronary artery disease)      Three-vessel coronary artery disease, status post percutaneous coronary intervention of the lesion in the distal circumflex artery  with a Promus drug-eluting stent.  Status post percutaneous coronary intervention of the lesion in the second marginal branchof the circumflex artery with a Promus drug-eluting stent.   . Arthritis     MEDICATIONS: reviewed  History   Social History  . Marital Status: Married    Spouse Name: N/A    Number of Children: N/A  . Years of Education: N/A   Occupational History  . RETIRED     [employer]   Social History Main Topics  . Smoking status: Never Smoker   . Smokeless tobacco: Not on file  . Alcohol Use: No  . Drug Use: No  . Sexually Active: Not on file   Other Topics Concern  . Not on file   Social History Narrative   Lives at home with husband.       Family History  Problem Relation Age of Onset  . Prostate cancer    . Heart failure Mother 21  . Coronary artery disease Brother 53    CABG, alive at 64  . Coronary artery disease Brother 12    Died  . Heart failure Sister   . Heart failure Sister     ROS - As per HPI. No nausea, vomiting, diarrhea, dysuria  Filed Vitals:   03/12/12 1700 03/12/12 1800 03/12/12 2000 03/12/12 2100  BP: 102/45 110/45 106/47 121/50  Pulse: 74 76 80 80  Temp:   98.3 F (36.8 C)   TempSrc:   Oral   Resp: 26 15 23 24   Height:      Weight:      SpO2: 97% 98% 97% 97%    EXAM:  Gen: appears younger than stated age. No acute distress off BiPAP HEENT: WNL Neck: No JVD Lungs: Bronchial BS in R base, dull to perc in R base, no wheezes Cardiovascular: RRR s M Abdomen: NABS, soft, NT Ext: No edema Neuro: Intact   DATA:  BMET    Component Value Date/Time   NA 128* 03/12/2012 0958   K 4.6 03/12/2012 0958   CL 95* 03/12/2012 0958   CO2 22 03/12/2012 0958   GLUCOSE 141* 03/12/2012 0958   BUN 32* 03/12/2012 0958   CREATININE 1.42* 03/12/2012 0958   CALCIUM 8.3* 03/12/2012 0865  GFRNONAA 31* 03/12/2012 0958   GFRAA 36* 03/12/2012 0958    CBC    Component Value Date/Time   WBC 28.7* 03/12/2012 0958   RBC 3.51* 03/12/2012 0958   HGB 10.3* 03/12/2012 0958   HCT 30.6* 03/12/2012 0958   PLT 164 03/12/2012 0958   MCV 87.2 03/12/2012 0958   MCH 29.3 03/12/2012 0958   MCHC 33.7 03/12/2012 0958   RDW 13.8 03/12/2012 0958   LYMPHSABS 1.5 03/12/2012 0958   MONOABS 3.6* 03/12/2012 0958   EOSABS 0.0 03/12/2012 0958   BASOSABS 0.0 03/12/2012 0958    CXR: bibasilar infiltrates, RLL effusion vs infiltrate  EKG: sinus tach, PRWP, NSST changes  IMPRESSION:   Principal Problem:  *Acute respiratory failure with hypoxia Active Problems:  Pulmonary edema  NSTEMI (non-ST elevated myocardial infarction)  Pneumonia   PLAN:  -Mgmt of cardiac issues per Cards -Ceftriaxone and Azithro for presumed CAP -Urine  for strep and legionella antigens -PRN BiPAP -Follow PCT -She is a remarkably fit appearing woman for her age. Nonetheless, it would be reasonable to address EOL issues. 82 yr olds rarely have a favorable outcome from ICU stays that involve aggressive therapies such as intubation    Billy Fischer, MD ; [facility] service Mobile [phone].  After 5:30 PM or weekends, call [phone]

## 2012-03-12 NOTE — Progress Notes (Signed)
ANTICOAGULATION CONSULT NOTE - Follow Up Consult  Pharmacy Consult for heparin Indication: chest pain/ACS  No Known Allergies  Patient Measurements: Height: 5\' 5"  (165.1 cm) Weight: 170 lb 10.2 oz (77.4 kg) IBW/kg (Calculated) : 57  Heparin Dosing Weight: 70.5kg  Vital Signs: Temp: 97.8 F (36.6 C) (06/22 1600) Temp src: Oral (06/22 1600) BP: 110/45 mmHg (06/22 1800) Pulse Rate: 76  (06/22 1800)  Labs:  Basename 03/12/12 1901 03/12/12 1530 03/12/12 0958 03/12/12 0953 03/12/12 0418 03/12/12 0417 03/10/12 2334  HGB -- -- 10.3* -- -- 11.7* --  HCT -- -- 30.6* -- -- 34.7* 32.9*  PLT -- -- 164 -- -- 186 164  APTT -- -- 31 -- -- -- --  LABPROT -- -- 14.5 -- -- -- --  INR -- -- 1.11 -- -- -- --  HEPARINUNFRC 0.28* -- -- -- -- -- --  CREATININE -- -- 1.42* -- -- 1.24* 1.19*  CKTOTAL -- 118 -- 122 132 -- --  CKMB -- 17.6* -- 16.7* 14.6* -- --  TROPONINI -- 5.24* -- 5.63* 4.45* -- --    Estimated Creatinine Clearance: 26.6 ml/min (by C-G formula based on Cr of 1.42).   Medical History: Past Medical History  Diagnosis Date  . Carotid artery stenosis     Left carotid endarterectomy 02/2001  . Hypertension   . Hyperlipidemia   . Thyroid disease   . CAD (coronary artery disease)      Three-vessel coronary artery disease, status post percutaneous coronary intervention of the lesion in the distal circumflex artery  with a Promus drug-eluting stent.  Status post percutaneous coronary intervention of the lesion in the second marginal branchof the circumflex artery with a Promus drug-eluting stent.   . Arthritis     Medications:  ASA 81mg  Plavix 75mg  PTA (last dose 6/22)  Assessment: 89 yoF to start on IV heparin for NSTEMI Tp trending down - peak 5.63.  Hgb, plts ok.  No bleeding noted. Estimated CrCl ~ 33 ml/min.  Heparin drip rate 850uts/hr HL 0.28 slighlty < goal.   - will increase given +Tp  Goal of Therapy:  Heparin level 0.3-0.7 units/ml Monitor platelets by  anticoagulation protocol: Yes   Plan:  1. Increase heparin drip 900 uts/hr  2.  Daily heparin, CBC levels.   Marcelino Scot 03/12/2012,7:47 PM

## 2012-03-12 NOTE — Progress Notes (Addendum)
MD notified of cardiac marker results. Patient continues to have no chest pain. No new orders received.

## 2012-03-13 ENCOUNTER — Inpatient Hospital Stay (HOSPITAL_COMMUNITY): Payer: Medicare Other

## 2012-03-13 DIAGNOSIS — I2510 Atherosclerotic heart disease of native coronary artery without angina pectoris: Secondary | ICD-10-CM

## 2012-03-13 DIAGNOSIS — J189 Pneumonia, unspecified organism: Secondary | ICD-10-CM

## 2012-03-13 DIAGNOSIS — N390 Urinary tract infection, site not specified: Secondary | ICD-10-CM | POA: Diagnosis present

## 2012-03-13 LAB — CBC
HCT: 31.9 % — ABNORMAL LOW (ref 36.0–46.0)
HCT: 34.1 % — ABNORMAL LOW (ref 36.0–46.0)
MCV: 86.9 fL (ref 78.0–100.0)
Platelets: 186 10*3/uL (ref 150–400)
Platelets: 181 10*3/uL (ref 150–400)
RBC: 3.67 MIL/uL — ABNORMAL LOW (ref 3.87–5.11)
RBC: 3.84 MIL/uL — ABNORMAL LOW (ref 3.87–5.11)
RDW: 13.7 % (ref 11.5–15.5)
RDW: 13.8 % (ref 11.5–15.5)
WBC: 20.9 10*3/uL — ABNORMAL HIGH (ref 4.0–10.5)
WBC: 18.8 10*3/uL — ABNORMAL HIGH (ref 4.0–10.5)

## 2012-03-13 LAB — BASIC METABOLIC PANEL
CO2: 25 mEq/L (ref 19–32)
Calcium: 8.5 mg/dL (ref 8.4–10.5)
Glucose, Bld: 101 mg/dL — ABNORMAL HIGH (ref 70–99)
Potassium: 4.1 mEq/L (ref 3.5–5.1)
Sodium: 132 mEq/L — ABNORMAL LOW (ref 135–145)

## 2012-03-13 LAB — LEGIONELLA ANTIGEN, URINE

## 2012-03-13 LAB — HEPARIN LEVEL (UNFRACTIONATED)
Heparin Unfractionated: 0.14 IU/mL — ABNORMAL LOW (ref 0.30–0.70)
Heparin Unfractionated: 0.22 IU/mL — ABNORMAL LOW (ref 0.30–0.70)

## 2012-03-13 LAB — URINE CULTURE

## 2012-03-13 MED ORDER — CLOPIDOGREL BISULFATE 75 MG PO TABS
75.0000 mg | ORAL_TABLET | Freq: Every day | ORAL | Status: DC
  Administered 2012-03-13 – 2012-03-15 (×3): 75 mg via ORAL
  Filled 2012-03-13 (×3): qty 1

## 2012-03-13 MED ORDER — CARVEDILOL 3.125 MG PO TABS
3.1250 mg | ORAL_TABLET | Freq: Two times a day (BID) | ORAL | Status: DC
  Administered 2012-03-13 – 2012-03-15 (×5): 3.125 mg via ORAL
  Filled 2012-03-13 (×8): qty 1

## 2012-03-13 MED ORDER — ATORVASTATIN CALCIUM 80 MG PO TABS
80.0000 mg | ORAL_TABLET | Freq: Every day | ORAL | Status: DC
  Filled 2012-03-13 (×4): qty 1

## 2012-03-13 MED ORDER — FUROSEMIDE 40 MG PO TABS
40.0000 mg | ORAL_TABLET | Freq: Every day | ORAL | Status: DC
  Administered 2012-03-14 – 2012-03-15 (×2): 40 mg via ORAL
  Filled 2012-03-13 (×2): qty 1

## 2012-03-13 MED ORDER — LEVOFLOXACIN 500 MG PO TABS
500.0000 mg | ORAL_TABLET | Freq: Every day | ORAL | Status: DC
  Administered 2012-03-13 – 2012-03-15 (×3): 500 mg via ORAL
  Filled 2012-03-13 (×3): qty 1

## 2012-03-13 NOTE — Progress Notes (Signed)
ANTICOAGULATION CONSULT NOTE - Follow Up Consult  Pharmacy Consult for heparin Indication: chest pain/ACS  No Known Allergies  Patient Measurements: Height: 5\' 5"  (165.1 cm) Weight: 162 lb 14.7 oz (73.9 kg) IBW/kg (Calculated) : 57  Heparin Dosing Weight: 72kg  Vital Signs: Temp: 98.1 F (36.7 C) (06/23 1600) Temp src: Oral (06/23 1600) BP: 132/48 mmHg (06/23 1737) Pulse Rate: 79  (06/23 1700)  Labs:  Basename 03/13/12 1700 03/13/12 0957 03/13/12 0615 03/12/12 2125 03/12/12 1901 03/12/12 1530 03/12/12 0958 03/12/12 0953 03/12/12 0417  HGB -- 11.2* 10.6* -- -- -- -- -- --  HCT -- 34.1* 31.9* -- -- -- 30.6* -- --  PLT -- 181 186 -- -- -- 164 -- --  APTT -- -- -- -- -- -- 31 -- --  LABPROT -- -- -- -- -- -- 14.5 -- --  INR -- -- -- -- -- -- 1.11 -- --  HEPARINUNFRC 0.22* -- 0.14* -- 0.28* -- -- -- --  CREATININE -- -- 1.50* -- -- -- 1.42* -- 1.24*  CKTOTAL -- -- -- 120 -- 118 -- 122 --  CKMB -- -- -- 15.7* -- 17.6* -- 16.7* --  TROPONINI -- -- -- 4.36* -- 5.24* -- 5.63* --    Estimated Creatinine Clearance: 24.6 ml/min (by C-G formula based on Cr of 1.5).   Medical History: Past Medical History  Diagnosis Date  . Carotid artery stenosis     Left carotid endarterectomy 02/2001  . Hypertension   . Hyperlipidemia   . Thyroid disease   . CAD (coronary artery disease)      Three-vessel coronary artery disease, status post percutaneous coronary intervention of the lesion in the distal circumflex artery  with a Promus drug-eluting stent.  Status post percutaneous coronary intervention of the lesion in the second marginal branchof the circumflex artery with a Promus drug-eluting stent.   . Arthritis     Medications:  ASA 81mg  Plavix 75mg  PTA (last dose 6/22)  Assessment: 89 yoF on IV heparin for NSTEMI. Tp peak 5.63.  Hgb, plts ok.  No bleeding or infusion issues noted per nurse. Estimated CrCl ~ 25 ml/min.  Heparin level remains < goal at 0.22.  Goal of Therapy:    Heparin level 0.3-0.7 units/ml Monitor platelets by anticoagulation protocol: Yes   Plan:  1. Increase heparin drip 1300 units/hr  2. F/u 8 hour HL @ 2400 2. Daily heparin, CBC levels.   Merilynn Finland, [employer] 03/13/2012,5:48 PM

## 2012-03-13 NOTE — Progress Notes (Signed)
ANTICOAGULATION CONSULT NOTE - Follow Up Consult  Pharmacy Consult for heparin Indication: chest pain/ACS  No Known Allergies  Patient Measurements: Height: 5\' 5"  (165.1 cm) Weight: 162 lb 14.7 oz (73.9 kg) IBW/kg (Calculated) : 57  Heparin Dosing Weight: 72kg  Vital Signs: Temp: 98.4 F (36.9 C) (06/23 0800) Temp src: Axillary (06/23 0800) BP: 138/103 mmHg (06/23 0800) Pulse Rate: 84  (06/23 0800)  Labs:  Lori [name] 03/13/12 0615 03/12/12 2125 03/12/12 1901 03/12/12 1530 03/12/12 0958 03/12/12 0953 03/12/12 0417  HGB 10.6* -- -- -- 10.3* -- --  HCT 31.9* -- -- -- 30.6* -- 34.7*  PLT 186 -- -- -- 164 -- 186  APTT -- -- -- -- 31 -- --  LABPROT -- -- -- -- 14.5 -- --  INR -- -- -- -- 1.11 -- --  HEPARINUNFRC 0.14* -- 0.28* -- -- -- --  CREATININE 1.50* -- -- -- 1.42* -- 1.24*  CKTOTAL -- 120 -- 118 -- 122 --  CKMB -- 15.7* -- 17.6* -- 16.7* --  TROPONINI -- 4.36* -- 5.24* -- 5.63* --    Estimated Creatinine Clearance: 24.6 ml/min (by C-G formula based on Cr of 1.5).   Medical History: Past Medical History  Diagnosis Date  . Carotid artery stenosis     Left carotid endarterectomy 02/2001  . Hypertension   . Hyperlipidemia   . Thyroid disease   . CAD (coronary artery disease)      Three-vessel coronary artery disease, status post percutaneous coronary intervention of the lesion in the distal circumflex artery  with a Promus drug-eluting stent.  Status post percutaneous coronary intervention of the lesion in the second marginal branchof the circumflex artery with a Promus drug-eluting stent.   . Arthritis     Medications:  ASA 81mg  Plavix 75mg  PTA (last dose 6/22)  Assessment: 89 yoF on IV heparin for NSTEMI. Tp peak 5.63.  Hgb, plts ok.  No bleeding or infusion issues noted per nurse. Estimated CrCl ~ 25 ml/min.  Heparin drip rate increased overnight, however level this AM is decreased and subtherapeutic.   Goal of Therapy:  Heparin level 0.3-0.7 units/ml Monitor  platelets by anticoagulation protocol: Yes   Plan:  1. Increase heparin drip 1150 units/hr  2. F/u 8 hour HL @ 1700 2. Daily heparin, CBC levels.   Naveena Eyman E 03/13/2012,8:37 AM

## 2012-03-13 NOTE — Progress Notes (Signed)
Patient ID: Lori [name], female   DOB: 1919-02-08, 78 y.o.   MRN: [id number]    SUBJECTIVE: Developed some right-sided chest pain while eating breakfast, now clearing up.  Chest pain otherwise resolved since yesterday.  Afebrile.  WBCs coming down.  Echo with EF 35-40% and peri-apical hypokinesis.    ECG with NSR, new TWIs in V3 and V4.      [name] Kitchen aspirin EC  81 mg Oral Daily  . aspirin EC  81 mg Oral Daily  . atorvastatin  80 mg Oral q1800  . azithromycin  500 mg Intravenous Q24H  . carvedilol  3.125 mg Oral BID WC  . cefTRIAXone (ROCEPHIN)  IV  1 g Intravenous Q24H  . clopidogrel  75 mg Oral Once  . clopidogrel  75 mg Oral Q breakfast  . furosemide  40 mg Oral Daily  . heparin  4,000 Units Intravenous Once  . levothyroxine  50 mcg Oral Daily  . piperacillin-tazobactam (ZOSYN)  IV  3.375 g Intravenous Once  . sodium chloride  3 mL Intravenous Q12H  . DISCONTD: aspirin  81 mg Oral Daily  . DISCONTD: furosemide  40 mg Oral BID      Filed Vitals:   03/13/12 0500 03/13/12 0600 03/13/12 0700 03/13/12 0800  BP: 134/52 122/54  138/103  Pulse: 80 80 81 84  Temp:    98.4 F (36.9 C)  TempSrc:    Axillary  Resp: 27 16 27 20   Height:      Weight:   73.9 kg (162 lb 14.7 oz)   SpO2: 98% 97% 98% 98%    Intake/Output Summary (Last 24 hours) at 03/13/12 0927 Last data filed at 03/13/12 0348  Gross per 24 hour  Intake 649.25 ml  Output   3345 ml  Net -2695.75 ml    LABS: Basic Metabolic Panel:  Basename 03/13/12 0615 03/12/12 0958  NA 132* 128*  K 4.1 4.6  CL 96 95*  CO2 25 22  GLUCOSE 101* 141*  BUN 32* 32*  CREATININE 1.50* 1.42*  CALCIUM 8.5 8.3*  MG -- 2.0  PHOS -- --   Liver Function Tests:  [facility] 03/12/12 0958 03/10/12 2334  AST 60* 35  ALT 51* 27  ALKPHOS 92 99  BILITOT 0.3 0.4  PROT 5.8* 6.2  ALBUMIN 2.6* 3.0*   No results found for this basename: LIPASE:2,AMYLASE:2 in the last 72 hours CBC:  Basename 03/13/12 0615 03/12/12 0958 03/10/12 2334    WBC 20.9* 28.7* --  NEUTROABS -- 23.6* 12.7*  HGB 10.6* 10.3* --  HCT 31.9* 30.6* --  MCV 86.9 87.2 --  PLT 186 164 --   Cardiac Enzymes:  Basename 03/12/12 2125 03/12/12 1530 03/12/12 0953  CKTOTAL 120 118 122  CKMB 15.7* 17.6* 16.7*  CKMBINDEX -- -- --  TROPONINI 4.36* 5.24* 5.63*   BNP: No components found with this basename: POCBNP:3 D-Dimer:  Alvira Philips 03/12/12 0417  DDIMER 2.69*   Hemoglobin A1C: No results found for this basename: HGBA1C in the last 72 hours Fasting Lipid Panel: No results found for this basename: CHOL,HDL,LDLCALC,TRIG,CHOLHDL,LDLDIRECT in the last 72 hours Thyroid Function Tests:  Basename 03/12/12 0958  TSH 3.303  T4TOTAL --  T3FREE --  THYROIDAB --   Anemia Panel: No results found for this basename: VITAMINB12,FOLATE,FERRITIN,TIBC,IRON,RETICCTPCT in the last 72 hours  RADIOLOGY: Dg Chest Port 1 View  03/13/2012  *RADIOLOGY REPORT*  Clinical Data: Follow up pulmonary edema and effusions.  PORTABLE CHEST - 1 VIEW 03/13/2012 0744 hours:  Comparison: Portable chest x-rays yesterday, 03/10/2012.  Findings: Interval marked improvement in the interstitial and airspace pulmonary edema since yesterday, with minimal interstitial edema persisting.  Decreasing bilateral pleural effusions, though small effusions persist, right greater than left.  Improved aeration in the lung bases.  No new pulmonary parenchymal abnormalities.  Cardiac silhouette mildly enlarged but stable.  IMPRESSION: Marked improvement in pulmonary edema since yesterday, though mild interstitial edema persist.  Decreasing bilateral pleural effusions, though small effusions persist.  Improved aeration in the lung bases.  No new abnormalities.  Original Report Authenticated By: Arnell Sieving, M.D.   Dg Chest Port 1 View  03/12/2012  *RADIOLOGY REPORT*  Clinical Data: Chest pain and respiratory distress.  PORTABLE CHEST - 1 VIEW  Comparison: Chest radiograph performed 03/10/2012   Findings: The lungs are well-aerated.  Vascular congestion is noted, with bilateral central and bibasilar airspace opacities, compatible with pulmonary edema.  This is new from the prior study. Small bilateral pleural effusions are seen.  No pneumothorax is identified.  The cardiomediastinal silhouette is normal in size; calcification is noted within the aortic arch.  No acute osseous abnormalities are seen.  IMPRESSION: Vascular congestion, with bilateral central and bibasilar airspace opacities, compatible with pulmonary edema.  Small bilateral pleural effusions seen.  Original Report Authenticated By: Tonia Ghent, M.D.   Dg Chest Port 1 View  03/11/2012  *RADIOLOGY REPORT*  Clinical Data: Fever.  Hypertension.  Coronary artery disease.  PORTABLE CHEST - 1 VIEW  Comparison: 08/05/2010  Findings: Low lung volumes are seen with mild linear opacity in both lung bases consistent with subsegmental atelectasis.  No evidence of pulmonary consolidation or edema.  No evidence of pleural effusion.  Heart size is within normal limits allowing for low lung volumes and portable technique.  IMPRESSION: Low lung volumes with mild bibasilar atelectasis.  Original Report Authenticated By: Danae Orleans, M.D.    PHYSICAL EXAM General: NAD Neck: JVP 7-8 cm, no thyromegaly or thyroid nodule.  Lungs: Bronchial breath sounds R>L base.  CV: Nondisplaced PMI.  Heart regular S1/S2, no S3/S4, 1/6 SEM.  Trace ankle edema.  No carotid bruit.   Abdomen: Soft, nontender, no hepatosplenomegaly, no distention.  Neurologic: Alert and oriented x 3.  Psych: Normal affect. Extremities: No clubbing or cyanosis.   TELEMETRY: Reviewed telemetry pt in NSR  ASSESSMENT AND PLAN: 78 yo with history of CAD presented with fever, leukocytosis, volume overload, NSTEMI.  1. CAD: NSTEMI.  Chest pain has resolved (had some right-sided pain eating breakfast today, think was GI related).  TnI peaked at 5.63.  Suspect she probably has had true  ACS with plaque rupture in the setting of an infection (rather than demand ischemia with hypertension/infection) given degree of troponin elevation and ECG changes.  Would be inclined to manage conservatively if she remains chest pain-free and stable.  Her creatinine is elevated and cath would put her at significant risk for acute kidney injury.  - Monitor today, as above inclined to manage medically.  - Continue ASA, statin, Plavix, heparin gtt - Add Coreg 3.125 mg bid.  2. CHF: Acute on ? Chronic systolic CHF.  EF 35-40% with periapical HK on echo.  She has a history of LAD PCI so unsure if she has a chronic ischemic cardiomyopathy (no prior echo in [facility]).  She diuresed well yesterday, volume status looks better and CXR more clear today.  Creatinine is higher.   - No more Lasix today, change to po for tomorrow.  - Add  Coreg 3.125 mg bid.  - No ACEI for now with AKI.  3. Renal: AKI with diuresis.  Backing off on Lasix today.  4. ID: High WBCs (but also had been on steroids) and high fever at home.  She has been afebrile here.  Procalcitonin was very high.  She has evidence for UTI on UA (culture not back yet).  I am also concerned for PNA => bronchial breath sounds at right base sound a lot like PNA though nothing definite by CXR.  At this time, covering for both UTI and PNA with ceftriaxone/azithro.  Urine strep negative.  Pending cultures.   Marca Ancona 03/13/2012 9:41 AM

## 2012-03-13 NOTE — Progress Notes (Signed)
PULMONARY/CCM CONSULT NOTE  Requesting MD/Service: Hochrein/Cards Date of admission: 6/22 Date of consult: 6/22 Reason for consultation: Acute respiratory failure  Pt Profile: 89 yowm presented to [facility] ED 6/22 AM with CP, resp distress with CXR c/w edema vs PNA. Trop I postive. PCT elevated. Transiently on BiPAP in ED. Improved with diuresis   Subj: Very animated. No distress. No new complaints  Filed Vitals:   03/13/12 0500 03/13/12 0600 03/13/12 0700 03/13/12 0800  BP: 134/52 122/54  138/103  Pulse: 80 80 81 84  Temp:    98.4 F (36.9 C)  TempSrc:    Axillary  Resp: 27 16 27 20   Height:      Weight:   73.9 kg (162 lb 14.7 oz)   SpO2: 98% 97% 98% 98%    EXAM:  Gen: appears younger than stated age. No acute distress HEENT: WNL Neck: No JVD Lungs: Bronchial BS in R base, dull to perc in R base, no wheezes Cardiovascular: RRR s M Abdomen: NABS, soft, NT Ext: No edema Neuro: Intact   DATA:  BMET    Component Value Date/Time   NA 132* 03/13/2012 0615   K 4.1 03/13/2012 0615   CL 96 03/13/2012 0615   CO2 25 03/13/2012 0615   GLUCOSE 101* 03/13/2012 0615   BUN 32* 03/13/2012 0615   CREATININE 1.50* 03/13/2012 0615   CALCIUM 8.5 03/13/2012 0615   GFRNONAA 29* 03/13/2012 0615   GFRAA 34* 03/13/2012 0615    CBC    Component Value Date/Time   WBC 18.8* 03/13/2012 0957   RBC 3.84* 03/13/2012 0957   HGB 11.2* 03/13/2012 0957   HCT 34.1* 03/13/2012 0957   PLT 181 03/13/2012 0957   MCV 88.8 03/13/2012 0957   MCH 29.2 03/13/2012 0957   MCHC 32.8 03/13/2012 0957   RDW 13.8 03/13/2012 0957   LYMPHSABS 1.5 03/12/2012 0958   MONOABS 3.6* 03/12/2012 0958   EOSABS 0.0 03/12/2012 0958   BASOSABS 0.0 03/12/2012 0958    Urine dipstick shows positive for WBC's.  Micro exam: TNCTC WBC's per HPF. Urine strep Ag negative Urine Legionella Ag pending  CXR: improved edema. Small R effusion persists    IMPRESSION:    *Acute respiratory failure with hypoxia - much improved  Pulmonary edema -  resolved R pleural effusion - likely due to CHF  NSTEMI (non-ST elevated myocardial infarction) - demand ischemia suspected per Cards  Possible Pneumonia Urinary tract infection  DISCUSSION: respiratory status much improved - suspect improvement due to diuresis. Unclear whether febrile illness is due to PNA or UTI @ this point   PLAN:  -Mgmt of cardiac issues per Cards -Change Ceftriaxone and Azithro to PO Levofloxacin for presumed CAP vs UTI -Recheck PCT AM 6/24 to ensure it is coming down as anticipated -Appears ready for transfer out of CCU from PCCM perspective   PCCM will sign off. Please Call if we can be of further assistance  Billy Fischer, MD ; [facility] [phone].  After 5:30 PM or weekends, call [phone]

## 2012-03-14 LAB — CBC
MCH: 29.5 pg (ref 26.0–34.0)
MCV: 86.8 fL (ref 78.0–100.0)
Platelets: 194 10*3/uL (ref 150–400)
RDW: 13.9 % (ref 11.5–15.5)

## 2012-03-14 LAB — BASIC METABOLIC PANEL
CO2: 23 mEq/L (ref 19–32)
Calcium: 8.6 mg/dL (ref 8.4–10.5)
Creatinine, Ser: 1.33 mg/dL — ABNORMAL HIGH (ref 0.50–1.10)
GFR calc Af Amer: 39 mL/min — ABNORMAL LOW (ref >90)

## 2012-03-14 LAB — HEPARIN LEVEL (UNFRACTIONATED): Heparin Unfractionated: 0.33 IU/mL (ref 0.30–0.70)

## 2012-03-14 NOTE — Progress Notes (Signed)
SUBJECTIVE:  Mild chest discomfort.  No SOB   PHYSICAL EXAM Filed Vitals:   03/14/12 0300 03/14/12 0400 03/14/12 0500 03/14/12 0600  BP: 152/53 150/49 150/66 147/63  Pulse: 87 88 87 83  Temp:  98.7 F (37.1 C)    TempSrc:  Oral    Resp: 19 17 18 15   Height:      Weight:    162 lb 14.7 oz (73.9 kg)  SpO2: 94% 92% 92% 92%   General:  No distress Lungs:  Diffuse crackles Heart:  RRR Abdomen:  Positive bowel sounds, no rebound no guarding Extremities:  Trace edema  LABS: Lab Results  Component Value Date   CKTOTAL 120 03/12/2012   CKMB 15.7* 03/12/2012   TROPONINI 4.36* 03/12/2012   Results for orders placed during the hospital encounter of 03/12/12 (from the past 24 hour(s))  CBC     Status: Abnormal   Collection Time   03/13/12  9:57 AM      Component Value Range   WBC 18.8 (*) 4.0 - 10.5 K/uL   RBC 3.84 (*) 3.87 - 5.11 MIL/uL   Hemoglobin 11.2 (*) 12.0 - 15.0 g/dL   HCT 16.1 (*) 09.6 - 04.5 %   MCV 88.8  78.0 - 100.0 fL   MCH 29.2  26.0 - 34.0 pg   MCHC 32.8  30.0 - 36.0 g/dL   RDW 40.9  81.1 - 91.4 %   Platelets 181  150 - 400 K/uL  HEPARIN LEVEL (UNFRACTIONATED)     Status: Abnormal   Collection Time   03/13/12  5:00 PM      Component Value Range   Heparin Unfractionated 0.22 (*) 0.30 - 0.70 IU/mL  HEPARIN LEVEL (UNFRACTIONATED)     Status: Normal   Collection Time   03/14/12 12:56 AM      Component Value Range   Heparin Unfractionated 0.33  0.30 - 0.70 IU/mL  CBC     Status: Abnormal   Collection Time   03/14/12  5:02 AM      Component Value Range   WBC 16.1 (*) 4.0 - 10.5 K/uL   RBC 3.86 (*) 3.87 - 5.11 MIL/uL   Hemoglobin 11.4 (*) 12.0 - 15.0 g/dL   HCT 78.2 (*) 95.6 - 21.3 %   MCV 86.8  78.0 - 100.0 fL   MCH 29.5  26.0 - 34.0 pg   MCHC 34.0  30.0 - 36.0 g/dL   RDW 08.6  57.8 - 46.9 %   Platelets 194  150 - 400 K/uL  BASIC METABOLIC PANEL     Status: Abnormal   Collection Time   03/14/12  5:02 AM      Component Value Range   Sodium 131 (*) 135 -  145 mEq/L   Potassium 4.6  3.5 - 5.1 mEq/L   Chloride 94 (*) 96 - 112 mEq/L   CO2 23  19 - 32 mEq/L   Glucose, Bld 94  70 - 99 mg/dL   BUN 27 (*) 6 - 23 mg/dL   Creatinine, Ser 6.29 (*) 0.50 - 1.10 mg/dL   Calcium 8.6  8.4 - 52.8 mg/dL   GFR calc non Af Amer 34 (*) >90 mL/min   GFR calc Af Amer 39 (*) >90 mL/min  PROCALCITONIN     Status: Normal   Collection Time   03/14/12  5:02 AM      Component Value Range   Procalcitonin 10.85      Intake/Output Summary (Last 24 hours)  at 03/14/12 0800 Last data filed at 03/14/12 0640  Gross per 24 hour  Intake 410.29 ml  Output   2710 ml  Net -2299.71 ml    ASSESSMENT AND PLAN:  1) Respiratory failure: Being treated for possible PNA.  On day number 3 of atb.  Switched to Levaquin yesterday.  Also with CHF.  Good urine output with PO diuretic.    2) CAD: As above.  Ruled in but pain free.  I am continuing conservative therapy.  On ASA and Plavix.  Stop Heparin.  Note on echo the EF is about 35% which it was on cath (40%) in 2011.  EKG reviewed 03/14/2012 and no new acute changes.  No cath.    3) UTI:  Atb as above.  4) Hypothyroid:  Continue synthroid.  TSH OK.    5)  CKD:  Creat slightly better today.  Continue low dose Lasix.    Lori [name] 03/14/2012 8:00 AM

## 2012-03-14 NOTE — Progress Notes (Signed)
ANTICOAGULATION CONSULT NOTE - Follow Up Consult  Pharmacy Consult for heparin Indication: chest pain/ACS  Labs:  Basename 03/14/12 0056 03/13/12 1700 03/13/12 0957 03/13/12 0615 03/12/12 2125 03/12/12 1530 03/12/12 0958 03/12/12 0953 03/12/12 0417  HGB -- -- 11.2* 10.6* -- -- -- -- --  HCT -- -- 34.1* 31.9* -- -- 30.6* -- --  PLT -- -- 181 186 -- -- 164 -- --  APTT -- -- -- -- -- -- 31 -- --  LABPROT -- -- -- -- -- -- 14.5 -- --  INR -- -- -- -- -- -- 1.11 -- --  HEPARINUNFRC 0.33 0.22* -- 0.14* -- -- -- -- --  CREATININE -- -- -- 1.50* -- -- 1.42* -- 1.24*  CKTOTAL -- -- -- -- 120 118 -- 122 --  CKMB -- -- -- -- 15.7* 17.6* -- 16.7* --  TROPONINI -- -- -- -- 4.36* 5.24* -- 5.63* --    Assessment/Plan: 78yo female now therapeutic on heparin after multiple rate increases.  Will continue gtt at current rate and confirm stable with additional level.  Colleen Can PharmD BCPS 03/14/2012,1:36 AM

## 2012-03-14 NOTE — Progress Notes (Signed)
Utilization Review Completed.Lori [name] T6/24/2013

## 2012-03-14 NOTE — Evaluation (Signed)
Physical Therapy Evaluation Patient Details Name: Lori [name] MRN: [id number] DOB: August 17, 1919 Today's Date: 03/14/2012 Time: 4098-1191 PT Time Calculation (min): 33 min  PT Assessment / Plan / Recommendation Clinical Impression  Lori [name] is 78 y/o female admitted with cp and SOB. Found to have pna and NSTEMI. Presents to therapy today with decreased activity tolerance and balance deficits secondary to immobilty. Will benefit physical therapy in the acute setting to address the below deficits so as to maximize her mobility and independence for a safe d/c home.  Given Lori [name]'s history of falls in addition to the balance and endurance issues I would recommend CIR vs SNF for f/u therapies prior to d/c home as I do not think her husband is a great physical assist for going home and daughter can only stay a few days. Had a long discussion about possibly participating in therapy before going home and pt very resistant.     PT Assessment  Patient needs continued PT services    Follow Up Recommendations  Inpatient Rehab    Barriers to Discharge Decreased caregiver support      lEquipment Recommendations  Defer to next venue    Recommendations for Other Services Rehab consult;OT consult   Frequency Min 3X/week (+ if pt decides to go home)    Precautions / Restrictions Precautions Precautions: Fall Restrictions Weight Bearing Restrictions: No         Mobility  Bed Mobility Bed Mobility: Supine to Sit;Sit to Supine Supine to Sit: 5: Supervision;HOB flat Sit to Supine: 5: Supervision;HOB flat Details for Bed Mobility Assistance: cues for efficiency of movement Transfers Transfers: Sit to Stand;Stand to Sit;Stand Pivot Transfers Sit to Stand: 4: Min guard;With upper extremity assist;From bed;From chair/3-in-1 Stand to Sit: 4: Min guard;With upper extremity assist;To chair/3-in-1;To bed Stand Pivot Transfers: 4: Min guard;With armrests Details for Transfer Assistance: cues  for safe hand placement especially when RW in front of pt she wanted to pull on it, slow and gaurded when standing appearing unsteady but able to stabilize with wide stance and arms out, legs also against the back of bed during sit->stand; SPT bed<>chair with definite need for hands and mingaurdA for safety, very slow and shuffled transfer Ambulation/Gait Ambulation/Gait Assistance: 4: Min assist Ambulation Distance (Feet): 16 Feet Assistive device: Rolling walker Ambulation/Gait Assistance Details: attempted ambulation without RW but pt too unsteady, gave pt RW and she ambulated approx 16 ft with RW, cues for safe technique and positioning with the RW, when instructing pt to step into RW she cried out "I can't! I will fall)." Slow flexed gait, needing more assist during turns, , increased DOE with ambulation Gait Pattern: Trunk flexed;Shuffle    Exercises     PT Diagnosis: Difficulty walking;Abnormality of gait;Generalized weakness  PT Problem List: Decreased activity tolerance;Decreased balance;Decreased mobility;Decreased safety awareness;Decreased knowledge of use of DME;Decreased cognition;Cardiopulmonary status limiting activity PT Treatment Interventions: DME instruction   PT Goals Acute Rehab PT Goals PT Goal Formulation: With patient Time For Goal Achievement: 03/28/12 Potential to Achieve Goals: Good Pt will go Supine/Side to Sit: with modified independence PT Goal: Supine/Side to Sit - Progress: Goal set today Pt will go Sit to Supine/Side: with modified independence PT Goal: Sit to Supine/Side - Progress: Goal set today Pt will go Sit to Stand: with modified independence PT Goal: Sit to Stand - Progress: Goal set today Pt will go Stand to Sit: with modified independence PT Goal: Stand to Sit - Progress: Goal set today Pt will Transfer  Bed to Chair/Chair to Bed: with modified independence PT Transfer Goal: Bed to Chair/Chair to Bed - Progress: Goal set today Pt will Ambulate:  >150 feet;with modified independence;with least restrictive assistive device PT Goal: Ambulate - Progress: Goal set today Pt will Go Up / Down Stairs: 1-2 stairs;with min assist PT Goal: Up/Down Stairs - Progress: Goal set today Pt will Perform Home Exercise Program: with supervision, verbal cues required/provided PT Goal: Perform Home Exercise Program - Progress: Goal set today Additional Goals Additional Goal #1: Pt will be able to complete Berg balance test to determine risk for falls.  PT Goal: Additional Goal #1 - Progress: Goal set today  Visit Information  Last PT Received On: 03/14/12 Assistance Needed: +1    Subjective Data  Subjective: I want to go home!   Prior Functioning  Home Living Lives With: Spouse (spouse walks with cane) Available Help at Discharge: Family Type of Home: House Home Access: Stairs to enter Secretary/administrator of Steps: 2 Entrance Stairs-Rails: None Home Layout: One level Bathroom Shower/Tub: Engineer, manufacturing systems: Standard Home Adaptive Equipment: Grab bars in shower;Walker - rolling;Straight cane (can borrow RW from sister-in-law) Additional Comments: daughter reports she can be home with patient for the first few days Prior Function Level of Independence: Independent with assistive device(s) Able to Take Stairs?: Yes (although reports falling on them x2) Driving: Yes Vocation: Retired Comments: both husband and patient are hard of hearing, daughter appears frustrated reporting they both are very stubborn  Communication Communication: HOH    Cognition  Overall Cognitive Status: Impaired Area of Impairment: Safety/judgement Safety/Judgement: Decreased awareness of safety precautions;Decreased safety judgement for tasks assessed;Decreased awareness of need for assistance Safety/Judgement - Other Comments: after discussion about falls and her needing rehab I gave the patient 2 scenarios 1) pt did rehab for 2 weeks and went home  and didn't have to come back to the hospital, 2) the patient went home, fell in 2 months and came back to the hospital with a broken hip. She was then asked what choice she would make and she reported "I don't know."  Cognition - Other Comments: pt reports multiple falls, one at church where she hit her head but did not come to the ER to be checked out; also reports that she doesn't like to complain about how she is feeling    Extremity/Trunk Assessment Right Upper Extremity Assessment RUE ROM/Strength/Tone: Within functional levels RUE Sensation: WFL - Light Touch;WFL - Proprioception RUE Coordination: WFL - gross/fine motor Left Upper Extremity Assessment LUE ROM/Strength/Tone: Within functional levels LUE Sensation: WFL - Light Touch;WFL - Proprioception LUE Coordination: WFL - gross/fine motor Right Lower Extremity Assessment RLE ROM/Strength/Tone: Within functional levels RLE Sensation: WFL - Light Touch;WFL - Proprioception RLE Coordination: WFL - gross/fine motor Left Lower Extremity Assessment LLE ROM/Strength/Tone: Within functional levels LLE Sensation: WFL - Light Touch;WFL - Proprioception LLE Coordination: WFL - gross/fine motor Trunk Assessment Trunk Assessment: Kyphotic   Balance Balance Balance Assessed: Yes Static Standing Balance Static Standing - Balance Support: No upper extremity supported Static Standing - Comment/# of Minutes: pt stood for at least a minute initially with wide stance and arms out to stabilize herself; easily fatigued just standing there needing a seated rest break prior to ambulation; plan was to do a Berg but pt with poor activity tolerance and fatigued with just the limited acitivty we did today in the evaluation   End of Session PT - End of Session Equipment Utilized During Treatment: Gait belt  Activity Tolerance: Patient tolerated treatment well;Patient limited by fatigue Patient left: in bed;with call bell/phone within reach;with bed alarm  set;with family/visitor present Nurse Communication: Mobility status;Other (comment) (OT consults recommendation)   WHITLOW,Jena Tegeler HELEN 03/14/2012, 4:03 PM

## 2012-03-15 ENCOUNTER — Telehealth: Payer: Self-pay | Admitting: Physician Assistant

## 2012-03-15 ENCOUNTER — Encounter (HOSPITAL_COMMUNITY): Payer: Self-pay | Admitting: Physician Assistant

## 2012-03-15 LAB — BASIC METABOLIC PANEL
BUN: 22 mg/dL (ref 6–23)
Chloride: 90 mEq/L — ABNORMAL LOW (ref 96–112)
Creatinine, Ser: 1.23 mg/dL — ABNORMAL HIGH (ref 0.50–1.10)
GFR calc Af Amer: 43 mL/min — ABNORMAL LOW (ref >90)
Glucose, Bld: 125 mg/dL — ABNORMAL HIGH (ref 70–99)

## 2012-03-15 MED ORDER — LEVOFLOXACIN 250 MG PO TABS: 250.0000 mg | ORAL_TABLET | Freq: Every day | ORAL | Status: AC

## 2012-03-15 MED ORDER — NITROGLYCERIN 0.4 MG SL SUBL: 0.4000 mg | SUBLINGUAL_TABLET | SUBLINGUAL | Status: DC | PRN

## 2012-03-15 MED ORDER — CARVEDILOL 3.125 MG PO TABS: 3.1250 mg | ORAL_TABLET | Freq: Two times a day (BID) | ORAL | Status: DC

## 2012-03-15 MED ORDER — ATORVASTATIN CALCIUM 20 MG PO TABS: 20.0000 mg | ORAL_TABLET | Freq: Every day | ORAL | Status: DC

## 2012-03-15 MED ORDER — FUROSEMIDE 40 MG PO TABS: 40.0000 mg | ORAL_TABLET | Freq: Every day | ORAL | Status: DC

## 2012-03-15 NOTE — Progress Notes (Signed)
I spoke extensively with the patient's daughter about her mother's hospitalization as well as the plan. Shawnte Winton PA-C

## 2012-03-15 NOTE — Progress Notes (Signed)
    SUBJECTIVE:  No further chest pain.  No SOB.  She does report frequent bowel movements.     PHYSICAL EXAM Filed Vitals:   03/14/12 1300 03/14/12 2100 03/15/12 0500 03/15/12 0929  BP: 137/63 126/67 146/57 126/61  Pulse: 81 76 73 87  Temp: 97.7 F (36.5 C) 98.1 F (36.7 C) 99.1 F (37.3 C)   TempSrc: Oral Oral Oral   Resp: 18 18 18    Height:      Weight:      SpO2: 97% 95% 95%    General:  No distress Lungs:  Clear Heart:  RRR Abdomen:  Positive bowel sounds, no rebound no guarding Extremities:  No edema  LABS:  No results found for this or any previous visit (from the past 24 hour(s)).  Intake/Output Summary (Last 24 hours) at 03/15/12 0951 Last data filed at 03/14/12 1233  Gross per 24 hour  Intake    240 ml  Output      0 ml  Net    240 ml    ASSESSMENT AND PLAN:  1) Respiratory failure: Being treated for possible PNA.  On day number 4 of antibiotics.   Also with CHF.  I/O incomplete with transfer from the unit.    2) CAD: As above.  Ruled in but pain free.  I am continuing conservative therapy.  On ASA and Plavix.  Stop Heparin.  Note on echo the EF is about 35% which it was on cath (40%) in 2011.  I have discussed this with the patient.  Medical management only.  3) Hypothyroid:  Continue synthroid.   4) CKD:  Repeat BMET pending.  Continue low dose Lasix.   I have talked with the case manager.  I do not have a contact number for her daughter.  Her sister in law is here.  She and the patient agree that she is ready for discharge.  I will check a BMET.  She will need home PT.  She will need to complete 10 days of antibiotics total.  She will be a high risk patient who will need follow up with [place] within 7 days.  She will need a BMET before this.       Rollene Rotunda 03/15/2012 9:51 AM

## 2012-03-15 NOTE — Discharge Instructions (Signed)
Home Health Services arranged with [facility]. [phone]. 1: Physical Therapy

## 2012-03-15 NOTE — Progress Notes (Signed)
Pt and pt family provided with d/c instructions and education. Pt family verbalized understanding. Pt plans to follow up with all scheduled appointments. Pt appears stable with no concerns at this time. All questions answered to pt satisfaction. IV removed with tip intact. Heart monitor cleaned and returned to front. Pt leaving floor in wheelchair with family for home. Ramond Craver, RN

## 2012-03-15 NOTE — Telephone Encounter (Signed)
Note opened in error. Kymiah Araiza PA-C  

## 2012-03-15 NOTE — Care Management Note (Signed)
    Page 1 of 1   03/15/2012     10:30:41 AM   CARE MANAGEMENT NOTE 03/15/2012  Patient:  Lori [name], Lori [name]   Account Number:  [id number]  Date Initiated:  03/15/2012  Documentation initiated by:  GRAVES-BIGELOW,Zebediah Beezley  Subjective/Objective Assessment:   Pt admitted with cp and dyspnea. Pt is from home with husband and he is independent. Has daughter that lives two houses down and pt still drives. Sister-n-law will provide pt RW.     Action/Plan:   Pt did choose [facility] for services of PT. Refusing HH RN at this time. CM trying to contact daughter and is unable to at this time. CM will make referral for services. SOC to begin within 24-48 hours post d/c.   Anticipated DC Date:  03/15/2012   Anticipated DC Plan:  HOME W HOME HEALTH SERVICES      DC Planning Services  CM consult      [facility] Choice  HOME HEALTH   Choice offered to / List presented to:  C-1 Patient        HH arranged  HH-2 PT      [facility] agency  [facility]   Status of service:  Completed, signed off Medicare Important Message given?   (If response is "NO", the following Medicare IM given date fields will be blank) Date Medicare IM given:   Date Additional Medicare IM given:    Discharge Disposition:  HOME W HOME HEALTH SERVICES  Per UR Regulation:  Reviewed for med. necessity/level of care/duration of stay  If discussed at Long Length of Stay Meetings, dates discussed:    Comments:

## 2012-03-15 NOTE — Progress Notes (Signed)
Physical Therapy Treatment Patient Details Name: Lori [name] MRN: [id number] DOB: 1919/02/18 Today's Date: 03/15/2012 Time: 7253-6644 PT Time Calculation (min): 24 min  PT Assessment / Plan / Recommendation Comments on Treatment Session  Patient with improved functional abilities today. Patient states that she is insistent upon return home and has her niece to assist her as needed. The only compromise to her safety today was when she became distracted in the hallways. In a quieter home environment this may not be an issue.     Follow Up Recommendations  Home health PT;Supervision - Intermittent    Barriers to Discharge  None      Equipment Recommendations  None recommended by PT (sister in law has given a walker to patient)    Recommendations for Other Services  None  Frequency Min 3X/week   Plan  Home health PT   Precautions / Restrictions Precautions Precautions: Fall   Pertinent Vitals/Pain VSS/No pain    Mobility  Bed Mobility Bed Mobility: Sitting - Scoot to Edge of Bed Supine to Sit: 7: Independent Sitting - Scoot to Edge of Bed: 7: Independent Sit to Supine: 7: Independent Transfers Sit to Stand: 6: Modified independent (Device/Increase time);From bed;With upper extremity assist Stand to Sit: To chair/3-in-1;To bed;6: Modified independent (Device/Increase time);With upper extremity assist Stand Pivot Transfers: 6: Modified independent (Device/Increase time) Details for Transfer Assistance: Educated in correct hand placement to and from device prior to attempt.  Ambulation/Gait Ambulation/Gait Assistance: 5: Supervision Ambulation Distance (Feet): 200 Feet Assistive device: Rolling walker Ambulation/Gait Assistance Details: Patient with improved speed of gait compared to reports of slow speed at prior visit. Speed of gait not indicative of recurrent falls at 2.0 feet/second. Some decline in safety when she becomes distracted in a busy hallway.      PT  Goals Acute Rehab PT Goals PT Goal: Supine/Side to Sit - Progress: Met PT Goal: Sit to Supine/Side - Progress: Met PT Goal: Sit to Stand - Progress: Met PT Goal: Stand to Sit - Progress: Met PT Goal: Ambulate - Progress: Progressing toward goal  Visit Information  Last PT Received On: 03/15/12 Assistance Needed: +1    Subjective Data  Subjective: Patient reports "I'm ready to go home today I feel better than ever" Patient Stated Goal: Home today   Cognition  Area of Impairment: Safety/judgement Arousal/Alertness: Awake/alert Orientation Level: Appears intact for tasks assessed Behavior During Session: [facility] for tasks performed Safety/Judgement - Other Comments: Distracted in hallways when sees staff she knows and releases walker to wave, but continues to walk with only one hand on the walker. Educated in safe use of walker.    Balance  Static Standing Balance Static Standing - Balance Support: No upper extremity supported Static Standing - Level of Assistance: 7: Independent High Level Balance High Level Balance Activites: Turns;Head turns;Direction changes;Side stepping High Level Balance Comments: No evidence of imbalance with above activities. Decreased balance with waving to nurses in hallways and releasing walker while trying to still walk with one hand on device and body rotated all the way to the right. Patient was able to self correct by placing hand back on her walker.   End of Session PT - End of Session Equipment Utilized During Treatment: Gait belt Activity Tolerance: Patient tolerated treatment well Patient left: in chair;with call bell/phone within reach;with family/visitor present Nurse Communication: Mobility status    Edwyna Perfect, PT  Pager [phone]  03/15/2012, 8:34 AM

## 2012-03-15 NOTE — Discharge Summary (Signed)
Discharge Summary   Patient ID: Lori [name] MRN: [id number], DOB/AGE: [birth year] 78 y.o. Admit date: 03/12/2012 D/C date:     03/15/2012   Primary Discharge Diagnoses:  1. Acute respiratory failure, multifactorial 2. Acute on chronic systolic CHF - EF 35-40% by echo this admission (previously 40% 2011) 3. Acute pneumonia 4. CAD with NSTEMI this admission, treated conservatively with med rx - given statin initiation, will need f/u LFTs/lipids in 6 weeks - prior hx: NSTEMI 2011 s/p PCI/DES to distal Cx, s/p PCI/DES to OM2 (EF 40%) 5. Hyponatremia 6. Acute renal insufficiency - Cr 1.23 but CrCl ~34 7. HTN  Secondary Discharge Diagnoses:  1. Recent E Coli UTI, pan sensitive 2. HL 3. Thyroid disease, euthyroid this admission 4. Carotid artery stenosis s/p L CEA 02/2001 5. Arthritis  Hospital Course: 78 y/o F with hx of CAD s/p PCI, HTN, HL who presented to [facility] with complaints of chest pain and SOB. She recently had a rash and has been taking steroids. On 6/19 she had developed a fever to 103 and severe chills. She was seen in an ER and diagnosed with a UTI and was started on Cipro but continued to feel poorly. Overnight the day of admission, she woke up with chest discomfort around 3 am and became acutely SOB. She was found to have an initial troponin of 4.45 with a total CK of 132 and MB of 14.6. [place] [address]. She was initially called a code STEMI, however on presentation she did not have acute ST elevation. She was not taken to the lab. EKG showed sinus tachycardia with old anterior MI and mild inferolateral ST depression. She was also very hypertensive with a markedly elevated WBC count. She was treated with IV NTG, heparin, albuterol, and started on antibiotics. Her breathing and pain improved with these measures and she was able to be switched from bipap to [place]. CXR showed vascular congestion, with bilateral central and bibasilar airspace opacities,  compatible with pulmonary edema, small bilateral pleural effusions seen.   Dr. Antoine Poche felt that her respiratory failures was cardiogenic with non-Q-wave microinfarction and heart failure; she also had a concurrent pneumonia. Procalcitonin was elevated, blood cultures remained negative. 2D echo demonstrated EF of 35-40% with peri-apical hypokinesis, normal RV size, mild pulm HTN. (Prior EF was 40% 2011.) She was started on diuresis, but had elevation in her Cr requiring downtitration of Lasix. Coreg was added. Dr. Shirlee Latch saw the patient on 03/13/12 and suspected she probably had had true ACS with plaque rupture in the setting of an infection (rather than demand ischemia with hypertension/infection) given degree of troponin elevation and ECG changes; however given her Cr she was not felt to be an ideal cath candidate and recommendation was made for conservative therapy. Her d-dimer was elevated but it was felt that she had a reasonable explanation for her symptoms which had improved with the therapies implemented - by day of discharge she was not tachycardic, tachypnic or hypoxic. Her procalcitonin was rechecked and was noted to be coming down. Her antibiotics were adjusted for appropriate coverage with eventual plan for PO conversion.   Today she is doing better. She is having frequent bowel movements, but upon further interrogation by Dr. Antoine Poche this did not sound like diarrhea. He has seen and examined the patient today and feels she is stable for discharge. Dr. Antoine Poche has recommended to continue low dose Lasix, with plans for close follow-up as well as BMET. The patient  agreed to HHPT but not RN. She will continue 6 more tablets to complete 10 days of antibiotics; her Levaquin was adjusted for renal insufficiency (CrCl~34) prior to discharge.  Discharge Vitals: Blood pressure 123/55, pulse 88, temperature 97.4 F (36.3 C), temperature source Oral, resp. rate 18, height 5\' 5"  (1.651 m), weight 162 lb  14.7 oz (73.9 kg), SpO2 98.00%.  Labs: Lab Results  Component Value Date   WBC 16.1* 03/14/2012   HGB 11.4* 03/14/2012   HCT 33.5* 03/14/2012   MCV 86.8 03/14/2012   PLT 194 03/14/2012     Lab 03/15/12 1007 03/12/12 0958  NA 127* --  K 3.9 --  CL 90* --  CO2 27 --  BUN 22 --  CREATININE 1.23* --  CALCIUM 8.9 --  PROT -- 5.8*  BILITOT -- 0.3  ALKPHOS -- 92  ALT -- 51*  AST -- 60*  GLUCOSE 125* --    Basename 03/12/12 2125  CKTOTAL 120  CKMB 15.7*  TROPONINI 4.36*   Lab Results  Component Value Date   DDIMER 2.69* 03/12/2012    Diagnostic Studies/Procedures   1. Chest Port 1 View 03/13/2012  *RADIOLOGY REPORT*  Clinical Data: Follow up pulmonary edema and effusions.  PORTABLE CHEST - 1 VIEW 03/13/2012 0744 hours:  Comparison: Portable chest x-rays yesterday, 03/10/2012.  Findings: Interval marked improvement in the interstitial and airspace pulmonary edema since yesterday, with minimal interstitial edema persisting.  Decreasing bilateral pleural effusions, though small effusions persist, right greater than left.  Improved aeration in the lung bases.  No new pulmonary parenchymal abnormalities.  Cardiac silhouette mildly enlarged but stable.  IMPRESSION: Marked improvement in pulmonary edema since yesterday, though mild interstitial edema persist.  Decreasing bilateral pleural effusions, though small effusions persist.  Improved aeration in the lung bases.  No new abnormalities.  Original Report Authenticated By: Arnell Sieving, M.D.   2. Chest Port 1 View 03/12/2012  *RADIOLOGY REPORT*  Clinical Data: Chest pain and respiratory distress.  PORTABLE CHEST - 1 VIEW  Comparison: Chest radiograph performed 03/10/2012  Findings: The lungs are well-aerated.  Vascular congestion is noted, with bilateral central and bibasilar airspace opacities, compatible with pulmonary edema.  This is new from the prior study. Small bilateral pleural effusions are seen.  No pneumothorax is identified.   The cardiomediastinal silhouette is normal in size; calcification is noted within the aortic arch.  No acute osseous abnormalities are seen.  IMPRESSION: Vascular congestion, with bilateral central and bibasilar airspace opacities, compatible with pulmonary edema.  Small bilateral pleural effusions seen.  Original Report Authenticated By: Tonia Ghent, M.D.   3. Chest Port 1 View 03/11/2012  *RADIOLOGY REPORT*  Clinical Data: Fever.  Hypertension.  Coronary artery disease.  PORTABLE CHEST - 1 VIEW  Comparison: 08/05/2010  Findings: Low lung volumes are seen with mild linear opacity in both lung bases consistent with subsegmental atelectasis.  No evidence of pulmonary consolidation or edema.  No evidence of pleural effusion.  Heart size is within normal limits allowing for low lung volumes and portable technique.  IMPRESSION: Low lung volumes with mild bibasilar atelectasis.  Original Report Authenticated By: Danae Orleans, M.D.   4. 2D Echo 03/12/12 Study Conclusions - Left ventricle: The cavity size was normal. Wall thickness  was increased in a pattern of mild LVH. There appears to be severe hypokinesis of the apex and the apical anterior/septal/lateral/inferior segments. Systolic function was moderately reduced. The estimated ejection fraction was in the range of 35% to 40%. Doppler  parameters are consistent with abnormal left ventricular relaxation (grade 1 diastolic dysfunction). - Aortic valve: There was no stenosis. Trivial regurgitation. - Mitral valve: Mild regurgitation. - Left atrium: The atrium was mildly to moderately dilated. - Right ventricle: The cavity size was normal. Systolic function was normal. - Tricuspid valve: Peak RV-RA gradient: 27mm Hg (S). - Pulmonary arteries: PA peak pressure: 37mm Hg (S). - Systemic veins: IVC measured 2.3 cm with some respirophasic variation, suggesting RA pressure 10 mmHg. Impressions: - Normal LV size with mild LV hypertrophy. EF 35-40% with peri-apical  hypokinesis. Normal RV size and systolic function. Mild pulmonary hypertension.  Discharge Medications   Medication List  As of 03/15/2012  3:44 PM   STOP taking these medications         amLODipine-olmesartan 5-20 MG per tablet      ciprofloxacin 500 MG tablet      ondansetron 4 MG tablet      simvastatin 20 MG tablet         TAKE these medications         aspirin 81 MG tablet   Take 81 mg by mouth daily.      atorvastatin 20 MG tablet   Commonly known as: LIPITOR   Take 1 tablet (20 mg total) by mouth at bedtime.      carvedilol 3.125 MG tablet   Commonly known as: COREG   Take 1 tablet (3.125 mg total) by mouth 2 (two) times daily with a meal.      clopidogrel 75 MG tablet   Commonly known as: PLAVIX   TAKE ONE TABLET BY MOUTH ONE TIME DAILY      furosemide 40 MG tablet   Commonly known as: LASIX   Take 1 tablet (40 mg total) by mouth daily.      levofloxacin 250 MG tablet   Commonly known as: LEVAQUIN   Take 1 tablet (250 mg total) by mouth daily.      levothyroxine 50 MCG tablet   Commonly known as: SYNTHROID, LEVOTHROID   Take 50 mcg by mouth daily.      nitroGLYCERIN 0.4 MG SL tablet   Commonly known as: NITROSTAT   Place 1 tablet (0.4 mg total) under the tongue every 5 (five) minutes x 3 doses as needed for chest pain.            Disposition   The patient will be discharged in stable condition to home. Discharge Orders    Future Appointments: Provider: Department: Dept Phone: Center:   03/18/2012 10:50 AM [facility] [phone] [facility]   03/21/2012 9:50 AM Beatrice Lecher, PA [facility] [phone] [facility]     Future Orders Please Complete By Expires   Diet - low sodium heart healthy      Increase activity slowly      Comments:   No driving until cleared by your doctor. No lifting over 5 lbs for 2 weeks. No sexual activity for 2 weeks. Keep procedure site clean & dry. If you notice increased pain,  swelling, bleeding or pus, call/return!  You may shower, but no soaking baths/hot tubs/pools for 1 week.   Discharge instructions      Comments:   Your heart ultrasound showed weakness of the heart muscle this admission. This may make you more susceptible to weight gain from fluid retention, which can lead to symptoms that we call heart failure.   For patients with this condition, we give them these special instructions:  1. Follow a low-salt diet and watch your fluid intake. In general, you should not be taking in more than 2 liters of fluid per day. Some patients are restricted to less than 1.5 liters. This includes sources of water in foods like soup. 2. Weigh yourself on the same scale at same time of day and keep a log. 3. Call your doctor: (Anytime you feel any of the following symptoms)  - 3-4 pound weight gain in 1-2 days or 2 pounds overnight  - Shortness of breath, with or without a dry hacking cough  - Swelling in the hands, feet or stomach  - If you have to sleep on extra pillows at night in order to breathe     Follow-up Information    Follow up with Tereso Newcomer, PA. ([facility] follow-up appointment - 03/21/12 at 9:50am)    Contact information:   [address] [place] [address] [phone]       Follow up with [facility] (03/18/12 for labwork only, can come to office between 8:30-4:30pm)    Contact information:   [address] [place] [address] [phone]            Duration of Discharge Encounter: Greater than 30 minutes including physician and PA time.  Signed, Ronie Spies PA-C 03/15/2012, 3:44 PM  Patient seen and examined.  Plan as discussed in my rounding note for today and outlined above. Rollene Rotunda  03/15/2012  10:13 PM

## 2012-03-17 ENCOUNTER — Telehealth: Payer: Self-pay | Admitting: Pharmacist

## 2012-03-17 NOTE — Telephone Encounter (Signed)
Spoke with pt's husband.  He states patient is doing well.  She has already been up and eaten breakfast this morning.  I could not find out if the patient had heard from the home health company yet or not.  He did state they have people that come over to help her bathe, etc.  He was aware of the appt on Monday and confirmed time.

## 2012-03-18 ENCOUNTER — Ambulatory Visit (INDEPENDENT_AMBULATORY_CARE_PROVIDER_SITE_OTHER): Payer: Medicare Other | Admitting: *Deleted

## 2012-03-18 DIAGNOSIS — I10 Essential (primary) hypertension: Secondary | ICD-10-CM

## 2012-03-18 DIAGNOSIS — I129 Hypertensive chronic kidney disease with stage 1 through stage 4 chronic kidney disease, or unspecified chronic kidney disease: Secondary | ICD-10-CM | POA: Diagnosis not present

## 2012-03-18 DIAGNOSIS — Z87440 Personal history of urinary (tract) infections: Secondary | ICD-10-CM | POA: Diagnosis not present

## 2012-03-18 DIAGNOSIS — I2510 Atherosclerotic heart disease of native coronary artery without angina pectoris: Secondary | ICD-10-CM | POA: Diagnosis not present

## 2012-03-18 DIAGNOSIS — I5022 Chronic systolic (congestive) heart failure: Secondary | ICD-10-CM | POA: Diagnosis not present

## 2012-03-18 DIAGNOSIS — N189 Chronic kidney disease, unspecified: Secondary | ICD-10-CM | POA: Diagnosis not present

## 2012-03-18 DIAGNOSIS — IMO0001 Reserved for inherently not codable concepts without codable children: Secondary | ICD-10-CM | POA: Diagnosis not present

## 2012-03-18 DIAGNOSIS — J9600 Acute respiratory failure, unspecified whether with hypoxia or hypercapnia: Secondary | ICD-10-CM | POA: Diagnosis not present

## 2012-03-18 LAB — BASIC METABOLIC PANEL
BUN: 28 mg/dL — ABNORMAL HIGH (ref 6–23)
CO2: 24 mEq/L (ref 19–32)
Calcium: 8.9 mg/dL (ref 8.4–10.5)
Creatinine, Ser: 1.3 mg/dL — ABNORMAL HIGH (ref 0.4–1.2)

## 2012-03-18 LAB — CULTURE, BLOOD (ROUTINE X 2)
Culture  Setup Time: 201306221223
Culture: NO GROWTH

## 2012-03-21 ENCOUNTER — Encounter: Payer: Self-pay | Admitting: Physician Assistant

## 2012-03-21 ENCOUNTER — Ambulatory Visit (INDEPENDENT_AMBULATORY_CARE_PROVIDER_SITE_OTHER): Payer: Medicare Other | Admitting: Physician Assistant

## 2012-03-21 VITALS — BP 126/68 | HR 89 | Ht 65.0 in | Wt 149.0 lb

## 2012-03-21 DIAGNOSIS — I255 Ischemic cardiomyopathy: Secondary | ICD-10-CM

## 2012-03-21 DIAGNOSIS — I10 Essential (primary) hypertension: Secondary | ICD-10-CM | POA: Diagnosis not present

## 2012-03-21 DIAGNOSIS — I129 Hypertensive chronic kidney disease with stage 1 through stage 4 chronic kidney disease, or unspecified chronic kidney disease: Secondary | ICD-10-CM | POA: Diagnosis not present

## 2012-03-21 DIAGNOSIS — Z87440 Personal history of urinary (tract) infections: Secondary | ICD-10-CM | POA: Diagnosis not present

## 2012-03-21 DIAGNOSIS — I5022 Chronic systolic (congestive) heart failure: Secondary | ICD-10-CM

## 2012-03-21 DIAGNOSIS — IMO0001 Reserved for inherently not codable concepts without codable children: Secondary | ICD-10-CM | POA: Diagnosis not present

## 2012-03-21 DIAGNOSIS — I2589 Other forms of chronic ischemic heart disease: Secondary | ICD-10-CM

## 2012-03-21 DIAGNOSIS — I2510 Atherosclerotic heart disease of native coronary artery without angina pectoris: Secondary | ICD-10-CM

## 2012-03-21 DIAGNOSIS — N189 Chronic kidney disease, unspecified: Secondary | ICD-10-CM | POA: Diagnosis not present

## 2012-03-21 DIAGNOSIS — J9600 Acute respiratory failure, unspecified whether with hypoxia or hypercapnia: Secondary | ICD-10-CM | POA: Diagnosis not present

## 2012-03-21 DIAGNOSIS — J189 Pneumonia, unspecified organism: Secondary | ICD-10-CM

## 2012-03-21 MED ORDER — ISOSORBIDE MONONITRATE ER 30 MG PO TB24: 15.0000 mg | ORAL_TABLET | Freq: Every day | ORAL | Status: DC

## 2012-03-21 NOTE — Patient Instructions (Addendum)
START IMDUR 30 MG TABLET TAKE 1/2 (HALF) TABLET DAILY  YOU HAVE BEEN GIVEN A PRESCRIPTION TO HAVE BLOOD DONE WITH YOUR PRIMARY CARE PHYSICIAN'S OFFICE IN 1 WEEK AROUND 7/8 OR 03/29/12 PLEASE HAVE RESULTS FAXED TO [place], [facility] [phone]  PLEASE MAKE APPT TO SEE DR. HOCHREIN 3-4 WEEKS IN THE [place] OFFICE; IF NOT AVAILABLE IN [place] THEN OK TO SCHEDULE W/[facility]ER, [facility] SAME DAY DR. Antoine Poche IS IN THE [facility] OFFICE

## 2012-03-21 NOTE — Progress Notes (Signed)
[address] [place], [place]  [address] Phone: [phone] Fax:  [phone]  Date:  03/21/2012   Name:  Lori [name]   DOB:  Jun 30, 1919   MRN:  [id number]  PCP:  Horald Pollen., PA  Primary Cardiologist:  Dr. Rollene Rotunda  Primary Electrophysiologist:  None    History of Present Illness: Lori [name] is a 78 y.o. female who returns for post hospital follow up .  She has a history of CAD, s/p NSTEMI in 2011 treated with a Promus DES to the dCFX and a Promus DES to the OM2, carotid artery disease, s/p left CEA in 2002, HTN, HL, ischemic cardiomyopathy, chronic systolic heart failure.  LHC in 07/2010 also demonstrated proximal LAD 80-90%, mid and distal LAD 70%, ostial, proximal and distal RCA 90%.  These lesions were treated medically.    She was admitted 6/22-6/25 with a NSTEMI in the setting of UTI, pneumonia and a/c systolic CHF.  She was diuresed and placed on antibiotics.  She developed worsening creatinine with diuresis.  Medical therapy was recommended for her NSTEMI given her renal insufficiency.  Echocardiogram 03/12/12: Mild LVH, severe HK of the apex and apical anterior/septal/lateral/inferior segments, EF 35-40%, grade 1 diastolic dysfunction, trivial AI, mild MR, mild to moderate LAE, PASP 37.  Follow up bmet after d/c with stable creatinine.    She is feeling much better.  She has noticed some chest tightness with increased activity and it resolves with rest.  She has not had to take any nitroglycerin.  Her breathing is improved.  She probably describes class III symptoms.  She denies orthopnea, PND.  She denies edema.  Her weight is stable since discharge from the hospital.  Cough is much improved.  Wt Readings from Last 3 Encounters:  03/21/12 149 lb (67.586 kg)  03/14/12 162 lb 14.7 oz (73.9 kg)  09/30/11 162 lb (73.483 kg)     Potassium  Date/Time Value Range Status  03/18/2012 10:58 AM 4.5  3.5 - 5.1 mEq/L Final  03/15/2012 10:07 AM  3.9  3.5 - 5.1 mEq/L Final  03/14/2012  5:02 AM 4.6  3.5 - 5.1 mEq/L Final     Creatinine, Ser  Date/Time Value Range Status  03/18/2012 10:58 AM 1.3* 0.4 - 1.2 mg/dL Final  6/96/2952 84:13 AM 1.23* 0.50 - 1.10 mg/dL Final  2/44/0102  7:25 AM 1.33* 0.50 - 1.10 mg/dL Final    Past Medical History  Diagnosis Date  . Carotid artery stenosis     Left carotid endarterectomy 02/2001  . Hypertension   . Hyperlipidemia   . Thyroid disease     Euthyroid 02/2012  . CAD (coronary artery disease)     a. NSTEMI in 2011 s/p PCI/DES to distal Cx, s/p PCI/DES to OM2 (EF 40%). b. NSTEMI 02/2012 treated conservatively.  . Arthritis   . CHF (congestive heart failure)     a. EF 40% in 2011. b. EF 35-40% by echo 03/12/12.  [name] Kitchen Respiratory failure     02/2012 - requiring bipap - multifactorial in setting of PNA/CHF/NSTEMI  . Pneumonia     02/2012  . Hyponatremia     Noted on 02/2012 admission  . Renal insufficiency     Noted on 02/2012 admission    Current Outpatient Prescriptions  Medication Sig Dispense Refill  . aspirin 81 MG tablet Take 81 mg by mouth daily.        [name] Kitchen atorvastatin (LIPITOR) 20 MG tablet Take 1 tablet (20 mg  total) by mouth at bedtime.  30 tablet  6  . carvedilol (COREG) 3.125 MG tablet Take 1 tablet (3.125 mg total) by mouth 2 (two) times daily with a meal.  60 tablet  6  . clopidogrel (PLAVIX) 75 MG tablet TAKE ONE TABLET BY MOUTH ONE TIME DAILY  30 tablet  10  . furosemide (LASIX) 40 MG tablet Take 1 tablet (40 mg total) by mouth daily.  30 tablet  6  . levofloxacin (LEVAQUIN) 250 MG tablet Take 1 tablet (250 mg total) by mouth daily.  6 tablet  0  . levothyroxine (SYNTHROID, LEVOTHROID) 50 MCG tablet Take 50 mcg by mouth daily.        . nitroGLYCERIN (NITROSTAT) 0.4 MG SL tablet Place 1 tablet (0.4 mg total) under the tongue every 5 (five) minutes x 3 doses as needed for chest pain.  25 tablet  4    Allergies: No Known Allergies  History  Substance Use Topics  . Smoking  status: Never Smoker   . Smokeless tobacco: Not on file  . Alcohol Use: No     ROS:  Please see the history of present illness.   No bleeding problems except for easy bruisability.   All other systems reviewed and negative.   PHYSICAL EXAM: VS:  BP 126/68  Pulse 89  Ht 5\' 5"  (1.651 m)  Wt 149 lb (67.586 kg)  BMI 24.79 kg/m2 Well nourished, well developed, in no acute distress HEENT: normal Neck: no JVD Cardiac:  normal S1, S2; RRR; no murmur Lungs:  clear to auscultation bilaterally, no wheezing, rhonchi or rales Abd: soft, nontender, no hepatomegaly Ext: no edema Skin: warm and dry Neuro:  CNs 2-12 intact, no focal abnormalities noted  EKG:  Sinus rhythm, heart rate 89, normal axis, Q waves in V1-V2, nonspecific ST-T wave changes, no significant changes prior tracing   ASSESSMENT AND PLAN:  1.  Coronary artery disease Overall stable after suffering a NSTEMI in the setting of acute heart failure and pneumonia.  She is having some stable anginal symptoms.  I have recommended adding isosorbide 15 mg a day.  Continue other medications as listed.  I will bring her back in followup with Dr. Antoine Poche in 3-4 weeks in [place].  2.  Chronic systolic congestive heart failure Volume stable. Continue current regimen. Check a basic metabolic panel in one week.  3.  Ischemic cardiomyopathy Conservative therapy is being pursued.  I believe that she is not on an ACE inhibitor due to her renal insufficiency.  Continue carvedilol.  Isosorbide is being added as noted above.  If her blood pressure will tolerate in the future, we can certainly consider adding low-dose hydralazine.  4.  Chronic kidney disease Follow up with a basic metabolic panel in one week as noted.  5.  Pneumonia She has finished her antibiotics.  She is clinically improved.  6.  Hyponatremia She has chronic, stable hyponatremia.  Recent sodium was stable.  Follow up basic metabolic panel in one week as noted.  7.   Hypertension Controlled.   Luna Glasgow, PA-C  9:40 AM 03/21/2012

## 2012-03-23 DIAGNOSIS — I2510 Atherosclerotic heart disease of native coronary artery without angina pectoris: Secondary | ICD-10-CM | POA: Diagnosis not present

## 2012-03-23 DIAGNOSIS — Z87440 Personal history of urinary (tract) infections: Secondary | ICD-10-CM | POA: Diagnosis not present

## 2012-03-23 DIAGNOSIS — IMO0001 Reserved for inherently not codable concepts without codable children: Secondary | ICD-10-CM | POA: Diagnosis not present

## 2012-03-23 DIAGNOSIS — I129 Hypertensive chronic kidney disease with stage 1 through stage 4 chronic kidney disease, or unspecified chronic kidney disease: Secondary | ICD-10-CM | POA: Diagnosis not present

## 2012-03-23 DIAGNOSIS — N189 Chronic kidney disease, unspecified: Secondary | ICD-10-CM | POA: Diagnosis not present

## 2012-03-23 DIAGNOSIS — J9600 Acute respiratory failure, unspecified whether with hypoxia or hypercapnia: Secondary | ICD-10-CM | POA: Diagnosis not present

## 2012-03-25 DIAGNOSIS — Z87440 Personal history of urinary (tract) infections: Secondary | ICD-10-CM | POA: Diagnosis not present

## 2012-03-25 DIAGNOSIS — N189 Chronic kidney disease, unspecified: Secondary | ICD-10-CM | POA: Diagnosis not present

## 2012-03-25 DIAGNOSIS — I129 Hypertensive chronic kidney disease with stage 1 through stage 4 chronic kidney disease, or unspecified chronic kidney disease: Secondary | ICD-10-CM | POA: Diagnosis not present

## 2012-03-25 DIAGNOSIS — I2510 Atherosclerotic heart disease of native coronary artery without angina pectoris: Secondary | ICD-10-CM | POA: Diagnosis not present

## 2012-03-25 DIAGNOSIS — J9600 Acute respiratory failure, unspecified whether with hypoxia or hypercapnia: Secondary | ICD-10-CM | POA: Diagnosis not present

## 2012-03-25 DIAGNOSIS — IMO0001 Reserved for inherently not codable concepts without codable children: Secondary | ICD-10-CM | POA: Diagnosis not present

## 2012-03-28 ENCOUNTER — Encounter: Payer: Self-pay | Admitting: Physician Assistant

## 2012-03-28 DIAGNOSIS — N189 Chronic kidney disease, unspecified: Secondary | ICD-10-CM | POA: Diagnosis not present

## 2012-03-28 DIAGNOSIS — I129 Hypertensive chronic kidney disease with stage 1 through stage 4 chronic kidney disease, or unspecified chronic kidney disease: Secondary | ICD-10-CM | POA: Diagnosis not present

## 2012-03-28 DIAGNOSIS — IMO0001 Reserved for inherently not codable concepts without codable children: Secondary | ICD-10-CM | POA: Diagnosis not present

## 2012-03-28 DIAGNOSIS — I2510 Atherosclerotic heart disease of native coronary artery without angina pectoris: Secondary | ICD-10-CM | POA: Diagnosis not present

## 2012-03-28 DIAGNOSIS — Z87440 Personal history of urinary (tract) infections: Secondary | ICD-10-CM | POA: Diagnosis not present

## 2012-03-28 DIAGNOSIS — J9600 Acute respiratory failure, unspecified whether with hypoxia or hypercapnia: Secondary | ICD-10-CM | POA: Diagnosis not present

## 2012-03-30 ENCOUNTER — Telehealth: Payer: Self-pay | Admitting: *Deleted

## 2012-03-30 NOTE — Telephone Encounter (Signed)
lvm this morning w/son, tired again just now to reach pt but no answer and no machine to lm. I will try again later

## 2012-03-30 NOTE — Telephone Encounter (Signed)
Message copied by Tarri Fuller on Wed Mar 30, 2012  2:36 PM ------      Message from: [place], [place] T      Created: Tue Mar 29, 2012  4:52 PM       K+ 4.5      Creatinine 1.17 (improved from previous 1.3)      Labs stable      Tereso Newcomer, [place]  4:52 PM 03/29/2012

## 2012-03-31 ENCOUNTER — Telehealth: Payer: Self-pay | Admitting: *Deleted

## 2012-03-31 NOTE — Telephone Encounter (Signed)
Pt notified of lab results today

## 2012-04-01 DIAGNOSIS — I2510 Atherosclerotic heart disease of native coronary artery without angina pectoris: Secondary | ICD-10-CM | POA: Diagnosis not present

## 2012-04-01 DIAGNOSIS — J9600 Acute respiratory failure, unspecified whether with hypoxia or hypercapnia: Secondary | ICD-10-CM | POA: Diagnosis not present

## 2012-04-01 DIAGNOSIS — IMO0001 Reserved for inherently not codable concepts without codable children: Secondary | ICD-10-CM | POA: Diagnosis not present

## 2012-04-01 DIAGNOSIS — N189 Chronic kidney disease, unspecified: Secondary | ICD-10-CM | POA: Diagnosis not present

## 2012-04-01 DIAGNOSIS — I129 Hypertensive chronic kidney disease with stage 1 through stage 4 chronic kidney disease, or unspecified chronic kidney disease: Secondary | ICD-10-CM | POA: Diagnosis not present

## 2012-04-01 DIAGNOSIS — Z87440 Personal history of urinary (tract) infections: Secondary | ICD-10-CM | POA: Diagnosis not present

## 2012-04-27 ENCOUNTER — Encounter: Payer: Self-pay | Admitting: Cardiology

## 2012-04-27 ENCOUNTER — Ambulatory Visit (INDEPENDENT_AMBULATORY_CARE_PROVIDER_SITE_OTHER): Payer: Medicare Other | Admitting: Cardiology

## 2012-04-27 VITALS — BP 130/60 | HR 84 | Ht 65.0 in | Wt 158.0 lb

## 2012-04-27 DIAGNOSIS — I5022 Chronic systolic (congestive) heart failure: Secondary | ICD-10-CM | POA: Diagnosis not present

## 2012-04-27 DIAGNOSIS — I214 Non-ST elevation (NSTEMI) myocardial infarction: Secondary | ICD-10-CM

## 2012-04-27 DIAGNOSIS — I2589 Other forms of chronic ischemic heart disease: Secondary | ICD-10-CM | POA: Diagnosis not present

## 2012-04-27 DIAGNOSIS — I255 Ischemic cardiomyopathy: Secondary | ICD-10-CM

## 2012-04-27 NOTE — Progress Notes (Signed)
HPI The patient presents for follow up of CAD.  She was last admitted on 6/22-6/25 with a NSTEMI in the setting of UTI, pneumonia and a/c systolic CHF. She was diuresed and placed on antibiotics. She developed worsening creatinine with diuresis. Medical therapy was recommended for her NSTEMI given her renal insufficiency. Echocardiogram 03/12/12 demonstrated an EF 35-40%  She was seen in the office in followup. She did have a low dose of Imdur added.  Since we last saw her she has done well. The patient denies any new symptoms such as chest discomfort, neck or arm discomfort. There has been no new shortness of breath, PND or orthopnea. There have been no reported palpitations, presyncope or syncope.  She says she gets around slowly but she does what she wants to do without significant limitations.   No Known Allergies  Current Outpatient Prescriptions  Medication Sig Dispense Refill  . aspirin 81 MG tablet Take 81 mg by mouth daily.        [name] Kitchen atorvastatin (LIPITOR) 20 MG tablet Take 1 tablet (20 mg total) by mouth at bedtime.  30 tablet  6  . carvedilol (COREG) 3.125 MG tablet Take 1 tablet (3.125 mg total) by mouth 2 (two) times daily with a meal.  60 tablet  6  . clopidogrel (PLAVIX) 75 MG tablet TAKE ONE TABLET BY MOUTH ONE TIME DAILY  30 tablet  10  . furosemide (LASIX) 40 MG tablet Take 1 tablet (40 mg total) by mouth daily.  30 tablet  6  . isosorbide mononitrate (IMDUR) 30 MG 24 hr tablet Take 0.5 tablets (15 mg total) by mouth daily.  30 tablet  11  . levothyroxine (SYNTHROID, LEVOTHROID) 50 MCG tablet Take 50 mcg by mouth daily.        . nitroGLYCERIN (NITROSTAT) 0.4 MG SL tablet Place 1 tablet (0.4 mg total) under the tongue every 5 (five) minutes x 3 doses as needed for chest pain.  25 tablet  4    Past Medical History  Diagnosis Date  . Carotid artery stenosis     Left carotid endarterectomy 02/2001  . Hypertension   . Hyperlipidemia   . Thyroid disease     Euthyroid 02/2012    . CAD (coronary artery disease)     a. NSTEMI in 2011 s/p PCI/DES to distal Cx, s/p PCI/DES to OM2 (EF 40%). b. NSTEMI 02/2012 treated conservatively.  . Arthritis   . CHF (congestive heart failure)     a. EF 40% in 2011. b. EF 35-40% by echo 03/12/12.  [name] Kitchen Respiratory failure     02/2012 - requiring bipap - multifactorial in setting of PNA/CHF/NSTEMI  . Pneumonia     02/2012  . Hyponatremia     Noted on 02/2012 admission  . Renal insufficiency     Noted on 02/2012 admission    Past Surgical History  Procedure Date  . Carotid endarterectomy     left 02/2001  . Cardiac catheterization     ROS  As stated in the HPI and negative for all other systems except  PHYSICAL EXAM BP 130/60  Pulse 84  Ht 5\' 5"  (1.651 m)  Wt 158 lb (71.668 kg)  BMI 26.29 kg/m2 HEENT:  Pupils equal round and reactive, fundi not visualized, oral mucosa unremarkable NECK:  No jugular venous distention, waveform within normal limits, carotid upstroke brisk and symmetric, left bruit and CEA scar, no thyromegaly LYMPHATICS:  No cervical, axillary adenopathy LUNGS:  Clear to auscultation bilaterally BACK:  No CVA tenderness CHEST:  Unremarkable HEART:  PMI not displaced or sustained,,S1 and S2 within normal limits, no S3, no S4, no clicks, no rubs, no murmurs ABD:  Flat, positive bowel sounds normal in frequency in pitch, no bruits, no rebound, no guarding, no midline pulsatile mass, no hepatomegaly, no splenomegaly EXT:  2 plus pulses upper, diminished bilateral DP/PT, no edema, no cyanosis no clubbing SKIN:  No rashes no nodules NEURO:  Cranial nerves II through XII grossly intact, motor grossly intact throughout PSYCH:  Cognitively intact, oriented to person place and time   ASSESSMENT AND PLAN  Coronary artery disease  She is having no symptoms. He tolerated the addition of a low-dose nitrate. No change in therapy is indicated.   Ischemic cardiomyopathy  She is euvolemic. Given her renal insufficiency  previously we are not using an ACE inhibitor. Otherwise no change in therapy is indicated. No further imaging is indicated.  Chronic kidney disease  Have her creatinine was 1.17. No change in therapy is indicated.  Hyponatremia  Her sodium recently was 139. No change in therapy is indicated.  Hypertension  Controlled.

## 2012-04-27 NOTE — Patient Instructions (Addendum)
The current medical regimen is effective;  continue present plan and medications.  Follow up in 6 months with Dr Hochrein.  You will receive a letter in the mail 2 months before you are due.  Please call us when you receive this letter to schedule your follow up appointment.  

## 2012-06-30 DIAGNOSIS — E039 Hypothyroidism, unspecified: Secondary | ICD-10-CM | POA: Diagnosis not present

## 2012-06-30 DIAGNOSIS — I10 Essential (primary) hypertension: Secondary | ICD-10-CM | POA: Diagnosis not present

## 2012-06-30 DIAGNOSIS — E785 Hyperlipidemia, unspecified: Secondary | ICD-10-CM | POA: Diagnosis not present

## 2012-06-30 DIAGNOSIS — E559 Vitamin D deficiency, unspecified: Secondary | ICD-10-CM | POA: Diagnosis not present

## 2012-09-22 DIAGNOSIS — L570 Actinic keratosis: Secondary | ICD-10-CM | POA: Diagnosis not present

## 2012-09-22 DIAGNOSIS — L821 Other seborrheic keratosis: Secondary | ICD-10-CM | POA: Diagnosis not present

## 2012-09-22 DIAGNOSIS — C44319 Basal cell carcinoma of skin of other parts of face: Secondary | ICD-10-CM | POA: Diagnosis not present

## 2012-09-22 DIAGNOSIS — C44621 Squamous cell carcinoma of skin of unspecified upper limb, including shoulder: Secondary | ICD-10-CM | POA: Diagnosis not present

## 2012-10-18 ENCOUNTER — Other Ambulatory Visit: Payer: Self-pay | Admitting: *Deleted

## 2012-10-24 DIAGNOSIS — C44529 Squamous cell carcinoma of skin of other part of trunk: Secondary | ICD-10-CM | POA: Diagnosis not present

## 2012-10-24 DIAGNOSIS — Z85828 Personal history of other malignant neoplasm of skin: Secondary | ICD-10-CM | POA: Diagnosis not present

## 2012-10-24 DIAGNOSIS — L820 Inflamed seborrheic keratosis: Secondary | ICD-10-CM | POA: Diagnosis not present

## 2012-10-24 DIAGNOSIS — L570 Actinic keratosis: Secondary | ICD-10-CM | POA: Diagnosis not present

## 2012-11-23 ENCOUNTER — Ambulatory Visit (INDEPENDENT_AMBULATORY_CARE_PROVIDER_SITE_OTHER): Payer: Medicare Other | Admitting: Cardiology

## 2012-11-23 ENCOUNTER — Encounter: Payer: Self-pay | Admitting: Cardiology

## 2012-11-23 VITALS — BP 147/64 | HR 70 | Ht 66.0 in | Wt 153.0 lb

## 2012-11-23 MED ORDER — PRAVASTATIN SODIUM 40 MG PO TABS: 40.0000 mg | ORAL_TABLET | Freq: Every day | ORAL | Status: DC

## 2012-11-23 MED ORDER — AMLODIPINE-OLMESARTAN 5-20 MG PO TABS: 1.0000 | ORAL_TABLET | Freq: Every day | ORAL | Status: DC | PRN

## 2012-11-23 MED ORDER — FUROSEMIDE 40 MG PO TABS: 40.0000 mg | ORAL_TABLET | Freq: Every day | ORAL | Status: DC

## 2012-11-23 MED ORDER — CARVEDILOL 3.125 MG PO TABS: 3.1250 mg | ORAL_TABLET | Freq: Two times a day (BID) | ORAL | Status: DC

## 2012-11-23 NOTE — Progress Notes (Signed)
HPI The patient presents for follow up of CAD.  She was last admitted on 6/22-6/25/13 with a NSTEMI in the setting of UTI, pneumonia and a/c systolic CHF. She was diuresed and placed on antibiotics. She developed worsening creatinine with diuresis. Medical therapy was recommended for her NSTEMI given her renal insufficiency. Echocardiogram 03/12/12 demonstrated an EF 35-40%  Since we last saw her she has done well. The patient denies any new symptoms such as chest discomfort, neck or arm discomfort. There has been no new shortness of breath, PND or orthopnea. There have been no reported palpitations, presyncope or syncope.  She does all of her chores.  Of note she is taking the Azor PRN.  She says that it lowers her BP too much otherwise.   No Known Allergies  Current Outpatient Prescriptions  Medication Sig Dispense Refill  . amLODipine-olmesartan (AZOR) 5-20 MG per tablet Take 1 tablet by mouth daily.      [name] Kitchen aspirin 81 MG tablet Take 81 mg by mouth daily.        . carvedilol (COREG) 3.125 MG tablet Take 1 tablet (3.125 mg total) by mouth 2 (two) times daily with a meal.  60 tablet  6  . clopidogrel (PLAVIX) 75 MG tablet TAKE ONE TABLET BY MOUTH ONE TIME DAILY  30 tablet  10  . furosemide (LASIX) 40 MG tablet Take 1 tablet (40 mg total) by mouth daily.  30 tablet  6  . isosorbide mononitrate (IMDUR) 30 MG 24 hr tablet Take 0.5 tablets (15 mg total) by mouth daily.  30 tablet  11  . levothyroxine (SYNTHROID, LEVOTHROID) 50 MCG tablet Take 50 mcg by mouth daily.        . nitroGLYCERIN (NITROSTAT) 0.4 MG SL tablet Place 1 tablet (0.4 mg total) under the tongue every 5 (five) minutes x 3 doses as needed for chest pain.  25 tablet  4  . pravastatin (PRAVACHOL) 40 MG tablet Take 40 mg by mouth daily.       No current facility-administered medications for this visit.    Past Medical History  Diagnosis Date  . Carotid artery stenosis     Left carotid endarterectomy 02/2001  . Hypertension   .  Hyperlipidemia   . Thyroid disease     Euthyroid 02/2012  . CAD (coronary artery disease)     a. NSTEMI in 2011 s/p PCI/DES to distal Cx, s/p PCI/DES to OM2 (EF 40%). b. NSTEMI 02/2012 treated conservatively.  . Arthritis   . CHF (congestive heart failure)     a. EF 40% in 2011. b. EF 35-40% by echo 03/12/12.  [name] Kitchen Respiratory failure     02/2012 - requiring bipap - multifactorial in setting of PNA/CHF/NSTEMI  . Pneumonia     02/2012  . Hyponatremia     Noted on 02/2012 admission  . Renal insufficiency     Noted on 02/2012 admission    Past Surgical History  Procedure Laterality Date  . Carotid endarterectomy      left 02/2001  . Cardiac catheterization      ROS  As stated in the HPI and negative for all other systems except  PHYSICAL EXAM BP 147/64  Pulse 70  Ht 5\' 6"  (1.676 m)  Wt 153 lb (69.4 kg)  BMI 24.71 kg/m2 NECK:  No jugular venous distention, waveform within normal limits, carotid upstroke brisk and symmetric, left bruit and CEA scar, no thyromegaly LUNGS:  Clear to auscultation bilaterally BACK:  No CVA tenderness CHEST:  Unremarkable HEART:  PMI not displaced or sustained,,S1 and S2 within normal limits, no S3, no S4, no clicks, no rubs, no murmurs ABD:  Flat, positive bowel sounds normal in frequency in pitch, no bruits, no rebound, no guarding, no midline pulsatile mass, no hepatomegaly, no splenomegaly EXT:  2 plus pulses upper, diminished bilateral DP/PT, no edema, no cyanosis no clubbing  ASSESSMENT AND PLAN  Coronary artery disease  She is having no symptoms. He tolerated the addition of a low-dose nitrate. No change in therapy is indicated.   Ischemic cardiomyopathy  She is euvolemic. We will continue with the meds as listed.   Chronic kidney disease  Labs have been stable and this will be followed by her primary provider.    Hypertension  She only wants to take the Azor PRN and I think that that is OK.

## 2012-11-23 NOTE — Patient Instructions (Addendum)
The current medical regimen is effective;  continue present plan and medications.  Follow up in 1 year with Dr Hochrein.  You will receive a letter in the mail 2 months before you are due.  Please call us when you receive this letter to schedule your follow up appointment.  

## 2012-11-24 DIAGNOSIS — Z85828 Personal history of other malignant neoplasm of skin: Secondary | ICD-10-CM | POA: Diagnosis not present

## 2012-11-24 DIAGNOSIS — L820 Inflamed seborrheic keratosis: Secondary | ICD-10-CM | POA: Diagnosis not present

## 2013-01-16 ENCOUNTER — Other Ambulatory Visit: Payer: Self-pay | Admitting: Physician Assistant

## 2013-02-01 ENCOUNTER — Other Ambulatory Visit: Payer: Self-pay | Admitting: *Deleted

## 2013-02-01 MED ORDER — PRAVASTATIN SODIUM 40 MG PO TABS: 40.0000 mg | ORAL_TABLET | Freq: Every day | ORAL | Status: DC

## 2013-02-01 NOTE — Telephone Encounter (Signed)
Even thought Epic says she has 11 refills, [facility] says she doesn't?

## 2013-02-09 DIAGNOSIS — L570 Actinic keratosis: Secondary | ICD-10-CM | POA: Diagnosis not present

## 2013-02-09 DIAGNOSIS — C44320 Squamous cell carcinoma of skin of unspecified parts of face: Secondary | ICD-10-CM | POA: Diagnosis not present

## 2013-02-09 DIAGNOSIS — L820 Inflamed seborrheic keratosis: Secondary | ICD-10-CM | POA: Diagnosis not present

## 2013-02-09 DIAGNOSIS — L219 Seborrheic dermatitis, unspecified: Secondary | ICD-10-CM | POA: Diagnosis not present

## 2013-03-13 DIAGNOSIS — Z85828 Personal history of other malignant neoplasm of skin: Secondary | ICD-10-CM | POA: Diagnosis not present

## 2013-03-13 DIAGNOSIS — L538 Other specified erythematous conditions: Secondary | ICD-10-CM | POA: Diagnosis not present

## 2013-03-13 DIAGNOSIS — L820 Inflamed seborrheic keratosis: Secondary | ICD-10-CM | POA: Diagnosis not present

## 2013-03-28 ENCOUNTER — Other Ambulatory Visit: Payer: Self-pay | Admitting: *Deleted

## 2013-03-28 MED ORDER — CLOPIDOGREL BISULFATE 75 MG PO TABS: 75.0000 mg | ORAL_TABLET | Freq: Every day | ORAL | Status: DC

## 2013-03-28 NOTE — Telephone Encounter (Signed)
NTBS.

## 2013-03-28 NOTE — Telephone Encounter (Signed)
Last seen 10/13 by ACM

## 2013-03-30 ENCOUNTER — Other Ambulatory Visit: Payer: Self-pay

## 2013-03-30 NOTE — Telephone Encounter (Signed)
When filled in May told that needed to be seen before next refill  No upcoming appt scheduled

## 2013-04-03 ENCOUNTER — Other Ambulatory Visit: Payer: Self-pay | Admitting: *Deleted

## 2013-04-03 MED ORDER — PRAVASTATIN SODIUM 40 MG PO TABS: 40.0000 mg | ORAL_TABLET | Freq: Every day | ORAL | Status: DC

## 2013-04-07 ENCOUNTER — Encounter: Payer: Self-pay | Admitting: Family Medicine

## 2013-04-07 ENCOUNTER — Ambulatory Visit (INDEPENDENT_AMBULATORY_CARE_PROVIDER_SITE_OTHER): Payer: Medicare Other | Admitting: Family Medicine

## 2013-04-07 VITALS — BP 137/66 | HR 76 | Temp 96.9°F | Ht 66.0 in | Wt 148.0 lb

## 2013-04-07 DIAGNOSIS — I2510 Atherosclerotic heart disease of native coronary artery without angina pectoris: Secondary | ICD-10-CM

## 2013-04-07 DIAGNOSIS — E785 Hyperlipidemia, unspecified: Secondary | ICD-10-CM | POA: Diagnosis not present

## 2013-04-07 MED ORDER — PRAVASTATIN SODIUM 40 MG PO TABS: 40.0000 mg | ORAL_TABLET | Freq: Every day | ORAL | Status: DC

## 2013-04-07 MED ORDER — CLOPIDOGREL BISULFATE 75 MG PO TABS: 75.0000 mg | ORAL_TABLET | Freq: Every day | ORAL | Status: DC

## 2013-04-07 NOTE — Patient Instructions (Signed)
Coronary Artery Disease Coronary artery disease (CAD) is a process in which the heart (coronary) arteries narrow or become blocked from the development of atherosclerosis. Atherosclerosis is a disease in which plaque builds up on the inside of the heart arteries (coronary arteries). Plaque is made up of fats (lipids), cholesterol, calcium, and fibrous tissue. CAD can lead to a heart attack (myocardial infarction, MI). An MI can lead to heart failure, cardiogenic shock, or sudden cardiac death. CAD can cause an MI through:  Plaque buildup that can severely narrow or block the coronary arteries and diminish blood flow.  Plaque that can become unstable and "rupture." Unstable plaque that ruptures within a coronary artery can form a clot and cause a sudden (acute) blockage. RISK FACTORS Many risk factors contribute to the development of CAD. These include:  High cholesterol (dyslipidemia) levels.  High blood pressure (hypertension).  Smoking.  Diabetes.  Age.  Gender. Men can develop CAD earlier in life than women.  Family history.  Inactivity or lack of regular physical or aerobic exercise.  A diet high in saturated fats.  Chronic kidney disease. SYMPTOMS  When a coronary artery is narrowed or blocked, an MI can occur. MI symptoms can include:  Chest pain (agina). Angina can occur by itself or it can also occur with pain in the neck, arm, jaw, or in the upper, middle back (mid-scapular pain).  Profuse sweating (diaphoresis) without physical activity or movement.  Shortness of breath (dyspnea).  Irregular heartbeats (palpitations) that feel like your heart is skipping beats or is beating very fast.  Nausea.  Epigastric pain. Epigastric pain may occur as "heartburn."  Tiredness (malaise). This can especially be present in the elderly. Women can have different (atypical) symptoms other than classic angina.  DIAGNOSIS  The diagnosis of CAD may include:  An electrocardiography  (ECG). An ECG does not diagnose CAD, but it is usefull in the detection of a sudden (acute) MI or as a marker for a previous MI. Depending on which heart (coronary) artery may be blocked, an ECG may not pick up an MI pattern.  Exercise stress test. A stress test can be performed at rest for people who are unable to do an exercise stress test. A stress test will only be abnormal if one or more of the large coronary arteries is significantly blocked.  Blood tests. Tests may include samples to detect heart muscle damage (such as troponin levels). Other tests may include cholesterol checks and an inflammation test (high-sensitivity C-reactive protein, hs-CRP).  Coronary angiography.  Screening people who have peripheral vascular disease (PAD). These people often times have CAD. TREATMENT  The treatment of CAD includes the following:  Lifestyle changes such as:  Following a heart-healthy diet. A registered dietitian can you help educate you on healthy food options and changes.  Quiting smoking.  Following an exercise program approved by your caregiver.  Maintaining a healthy weight. Lose weight as approved by your caregiver.  Medicines to help control your blood pressure, cholesterol level, angina, and blood clotting. Medicines may include beta-blockers, ACE inhibitors, statins, nitrates, and anti-platelet medicines.  If you have a heart stent and are taking anti-platelet medicine, it is important to not suddenly stop taking this medicine. Suddenly stopping anti-platelet medicine can result in an MI. Talk with your caregiver before stopping medicine or if you cannot afford your medicine.  If the coronary arteries are significantly blocked, surgery may be needed. This can include:  Percutaneous coronary intervention (PCI) with or without stent placement.    Coronary artery bypass graft surgery (CABG). SEEK IMMEDIATE MEDICAL CARE IF:   You develop MI symptoms. This is a medical emergency. Get  help at once. Call your local emergency service (911 in the [place] immediately. Do not drive yourself to the clinic or hospital. MI symptoms can include:  Angina or pain that occurs in the neck, arm, jaw, or in the upper middle back.  Profuse sweating without cause.  Shortness of breath or difficulty breathing without cause.  Unexplained nausea or epigastric pain that feels like heartburn. Document Released: 11/30/2011 Document Reviewed: 11/30/2011 [employer] Patient Information 2014 [employer].  

## 2013-04-07 NOTE — Progress Notes (Signed)
  Subjective:    Patient ID: Lori [name], female    DOB: 1918/12/19, 79 y.o.   MRN: [id number]  HPI  This 79 y.o. female presents for evaluation of routine visit.  She has hx of CAD and hyperlipidemia. She is needing refills on her medication.  She has seen her cardiologist this year and she is doing Fine according to patient.  She has 2 coronary artery stents and is taking plavix.  She c/o senile purpura Or bruising on her armsf.  Review of Systems    No chest pain, SOB, HA, dizziness, vision change, N/V, diarrhea, constipation, dysuria, urinary urgency or frequency, myalgias, arthralgias or rash.  Objective:   Physical Exam Vital signs noted  Well developed well nourished female.  HEENT - Head atraumatic Normocephalic                Eyes - PERRLA, Conjuctiva - clear Sclera- Clear EOMI                Ears - EAC's Wnl TM's Wnl Gross Hearing WNL                Nose - Nares patent                 Throat - oropharanx wnl Respiratory - Lungs CTA bilateral Cardiac - RRR S1 and S2 without murmur Extremities - No edema. Neuro - Grossly intact.       Assessment & Plan:  Coronary artery disease - Continue plavix and reassured her that the bruising in her arms is due To her plavix but the plavix is very important in her tx of CAD and she needs to continue this Refill plavix w/5rf.  Follow up in 6 months.  Discussed we will get labs next visit.    Other and unspecified hyperlipidemia - Pravachol refilled and will check FLP next visit.

## 2013-05-10 ENCOUNTER — Other Ambulatory Visit: Payer: Self-pay | Admitting: Nurse Practitioner

## 2013-05-11 NOTE — Telephone Encounter (Signed)
Last lipid 06/30/12   ACM

## 2013-05-15 ENCOUNTER — Other Ambulatory Visit: Payer: Self-pay | Admitting: Physician Assistant

## 2013-05-15 ENCOUNTER — Other Ambulatory Visit: Payer: Self-pay | Admitting: Family Medicine

## 2013-05-15 MED ORDER — PRAVASTATIN SODIUM 40 MG PO TABS: 40.0000 mg | ORAL_TABLET | Freq: Every day | ORAL | Status: DC

## 2013-05-15 NOTE — Telephone Encounter (Signed)
Pravastatin sent to pharm

## 2013-05-15 NOTE — Telephone Encounter (Signed)
Refill sent.

## 2013-05-18 ENCOUNTER — Emergency Department (HOSPITAL_COMMUNITY)
Admission: EM | Admit: 2013-05-18 | Discharge: 2013-05-18 | Disposition: A | Discharge status: Home / Self Care | Payer: Medicare Other | Attending: Emergency Medicine | Admitting: Emergency Medicine

## 2013-05-18 ENCOUNTER — Encounter (HOSPITAL_COMMUNITY): Payer: Self-pay | Admitting: Emergency Medicine

## 2013-05-18 ENCOUNTER — Emergency Department (HOSPITAL_COMMUNITY): Payer: Medicare Other

## 2013-05-18 DIAGNOSIS — I509 Heart failure, unspecified: Secondary | ICD-10-CM | POA: Diagnosis not present

## 2013-05-18 DIAGNOSIS — R21 Rash and other nonspecific skin eruption: Secondary | ICD-10-CM | POA: Insufficient documentation

## 2013-05-18 DIAGNOSIS — Z7982 Long term (current) use of aspirin: Secondary | ICD-10-CM | POA: Diagnosis not present

## 2013-05-18 DIAGNOSIS — Z9861 Coronary angioplasty status: Secondary | ICD-10-CM | POA: Diagnosis not present

## 2013-05-18 DIAGNOSIS — E079 Disorder of thyroid, unspecified: Secondary | ICD-10-CM | POA: Diagnosis not present

## 2013-05-18 DIAGNOSIS — Z8701 Personal history of pneumonia (recurrent): Secondary | ICD-10-CM | POA: Diagnosis not present

## 2013-05-18 DIAGNOSIS — E785 Hyperlipidemia, unspecified: Secondary | ICD-10-CM | POA: Diagnosis not present

## 2013-05-18 DIAGNOSIS — M129 Arthropathy, unspecified: Secondary | ICD-10-CM | POA: Diagnosis not present

## 2013-05-18 DIAGNOSIS — M79604 Pain in right leg: Secondary | ICD-10-CM

## 2013-05-18 DIAGNOSIS — Z8679 Personal history of other diseases of the circulatory system: Secondary | ICD-10-CM | POA: Diagnosis not present

## 2013-05-18 DIAGNOSIS — M79609 Pain in unspecified limb: Secondary | ICD-10-CM | POA: Diagnosis not present

## 2013-05-18 DIAGNOSIS — Z87448 Personal history of other diseases of urinary system: Secondary | ICD-10-CM | POA: Insufficient documentation

## 2013-05-18 DIAGNOSIS — M549 Dorsalgia, unspecified: Secondary | ICD-10-CM | POA: Insufficient documentation

## 2013-05-18 DIAGNOSIS — Z8709 Personal history of other diseases of the respiratory system: Secondary | ICD-10-CM | POA: Insufficient documentation

## 2013-05-18 DIAGNOSIS — I2510 Atherosclerotic heart disease of native coronary artery without angina pectoris: Secondary | ICD-10-CM | POA: Diagnosis not present

## 2013-05-18 DIAGNOSIS — I10 Essential (primary) hypertension: Secondary | ICD-10-CM | POA: Diagnosis not present

## 2013-05-18 DIAGNOSIS — Z79899 Other long term (current) drug therapy: Secondary | ICD-10-CM | POA: Diagnosis not present

## 2013-05-18 MED ORDER — HYDROCODONE-ACETAMINOPHEN 5-325 MG PO TABS: 1.0000 | ORAL_TABLET | Freq: Four times a day (QID) | ORAL | Status: DC | PRN

## 2013-05-18 NOTE — ED Notes (Signed)
States "i just want a shot for pain"

## 2013-05-18 NOTE — ED Notes (Signed)
Pt c/o lower back pain that radiates down right leg. Pt states this is a chronic problem. Pt states "I just want a pain shot".

## 2013-05-18 NOTE — ED Notes (Signed)
Pt c/o chronic r leg/hip pain. Has had shots in past due to sciatic nerve per pt. C/o pain to right hip down to knee x 1 week now. Pt ambulatory with walker. Alert/oriented.

## 2013-05-18 NOTE — ED Provider Notes (Signed)
CSN: [id number]     Arrival date & time 05/18/13  1345 History   First MD Initiated Contact with Patient 05/18/13 1459     Chief Complaint  Patient presents with  . Leg Pain   (Consider location/radiation/quality/duration/timing/severity/associated sxs/prior Treatment) Patient is a 79 y.o. female presenting with leg pain. The history is provided by the patient.  Leg Pain Location:  Leg Injury: no   Leg location:  R leg Pain details:    Quality:  Aching, shooting and cramping   Severity:  Moderate   Onset quality:  Gradual   Timing:  Intermittent Chronicity:  New Dislocation: no   Foreign body present:  No foreign bodies Worsened by:  Nothing tried Ineffective treatments:  None tried Associated symptoms: back pain   Associated symptoms: no fever and no neck pain    Lori [name] is a 79 y.o. female who presents to the ED with right leg pain. She states that the pain starts in her upper leg in the back and goes to her knee and then today had pain in the right calf. The pain has been off and on for a month but today the calf pain was bad enough to come to the ED. She denies injury, fever, nausea or vomiting or other problems.  Past Medical History  Diagnosis Date  . Carotid artery stenosis     Left carotid endarterectomy 02/2001  . Hypertension   . Hyperlipidemia   . Thyroid disease     Euthyroid 02/2012  . CAD (coronary artery disease)     a. NSTEMI in 2011 s/p PCI/DES to distal Cx, s/p PCI/DES to OM2 (EF 40%). b. NSTEMI 02/2012 treated conservatively.  . Arthritis   . CHF (congestive heart failure)     a. EF 40% in 2011. b. EF 35-40% by echo 03/12/12.  [name] Kitchen Respiratory failure     02/2012 - requiring bipap - multifactorial in setting of PNA/CHF/NSTEMI  . Pneumonia     02/2012  . Hyponatremia     Noted on 02/2012 admission  . Renal insufficiency     Noted on 02/2012 admission   Past Surgical History  Procedure Laterality Date  . Carotid endarterectomy      left 02/2001  .  Cardiac catheterization     Family History  Problem Relation Age of Onset  . Prostate cancer    . Heart failure Mother 94  . Coronary artery disease Brother 63    CABG, alive at 20  . Coronary artery disease Brother 68    Died  . Heart failure Sister   . Heart failure Sister    History  Substance Use Topics  . Smoking status: Never Smoker   . Smokeless tobacco: Never Used  . Alcohol Use: No   OB History   Grav Para Term Preterm Abortions TAB SAB Ect Mult Living                 Review of Systems  Constitutional: Negative for fever.  HENT: Negative for neck pain.   Respiratory: Negative for shortness of breath.   Cardiovascular: Negative for chest pain.  Gastrointestinal: Negative for nausea, vomiting and abdominal pain.  Genitourinary: Negative for dysuria and urgency.  Musculoskeletal: Positive for back pain.  Skin: Positive for rash.       Rash to lower right leg that has been itching.  Neurological: Negative for headaches.  Psychiatric/Behavioral: The patient is not nervous/anxious.     Allergies  Review of patient's allergies indicates  no known allergies.  Home Medications   Current Outpatient Rx  Name  Route  Sig  Dispense  Refill  . amLODipine-olmesartan (AZOR) 5-20 MG per tablet   Oral   Take 1 tablet by mouth daily as needed.   30 tablet   11   . aspirin 81 MG tablet   Oral   Take 81 mg by mouth daily.           . carvedilol (COREG) 3.125 MG tablet      TAKE ONE TABLET BY MOUTH TWICE A DAY WITH MEALS   60 tablet   0   . clopidogrel (PLAVIX) 75 MG tablet   Oral   Take 1 tablet (75 mg total) by mouth daily.   30 tablet   5   . furosemide (LASIX) 40 MG tablet      TAKE ONE TABLET BY MOUTH ONE TIME DAILY   30 tablet   0   . isosorbide mononitrate (IMDUR) 30 MG 24 hr tablet      TAKE ONE HALF TABLET BY MOUTH EVERY DAY   30 tablet   10   . levothyroxine (SYNTHROID, LEVOTHROID) 50 MCG tablet      TAKE ONE TABLET BY MOUTH ONE TIME  DAILY   30 tablet   4   . pravastatin (PRAVACHOL) 40 MG tablet   Oral   Take 1 tablet (40 mg total) by mouth daily.   30 tablet   11   . EXPIRED: nitroGLYCERIN (NITROSTAT) 0.4 MG SL tablet   Sublingual   Place 1 tablet (0.4 mg total) under the tongue every 5 (five) minutes x 3 doses as needed for chest pain.   25 tablet   4    BP 124/45  Pulse 66  Temp(Src) 97.7 F (36.5 C) (Oral)  Resp 19  SpO2 99% Physical Exam  Nursing note and vitals reviewed. Constitutional: She is oriented to person, place, and time. She appears well-developed and well-nourished. No distress.  Patient looks younger than her stated age.   HENT:  Head: Normocephalic and atraumatic.  Eyes: EOM are normal.  Neck: Normal range of motion. Neck supple.  Cardiovascular: Normal rate.   Pulmonary/Chest: Effort normal.  Musculoskeletal: Normal range of motion.       Legs: There is minimal tenderness with palpation of the right upper leg, knee and calf. Pedal pulses strong and adequate circulation. Patient ambulatory with walker. Pushed walker aside and walked in room.   Neurological: She is alert and oriented to person, place, and time. She has normal strength. No cranial nerve deficit or sensory deficit. Gait normal.  Skin: Skin is warm and dry.  Psychiatric: She has a normal mood and affect. Her behavior is normal.   US Venous Img Lower Unilateral Right  05/18/2013   *RADIOLOGY REPORT*  Clinical Data: Redness, pain.  RIGHT LOWER EXTREMITY VENOUS DOPPLER ULTRASOUND  Technique: Gray-scale sonography with compression, as well as color and duplex ultrasound, were performed to evaluate the deep venous system from the level of the common femoral vein through the popliteal and proximal calf veins.  Comparison: None  Findings:  Normal compressibility of  the common femoral, superficial femoral, and popliteal veins, as well as the proximal calf veins.  No filling defects to suggest DVT on grayscale or color Doppler  imaging.  Doppler waveforms show normal direction of venous flow, normal respiratory phasicity and response to augmentation.  IMPRESSION: No evidence of  lower extremity deep vein thrombosis.   Original Report  Authenticated By: D. Andria Rhein, MD    ED Course: doppler results reviewed.  Procedures  Dr. Gwendolyn Grant in to examine the patient will get doppler study of right lower extremity.   MDM  79 y.o. female with pain in the right knee and lower leg x one week. Chronic right hip pain. Normal doppler study. Will treat patient's pain and she will follow up with her PCP. Patient ambulatory on discharge without difficulty.    Medication List    TAKE these medications       HYDROcodone-acetaminophen 5-325 MG per tablet  Commonly known as:  NORCO/VICODIN  Take 1 tablet by mouth every 6 (six) hours as needed for pain.      ASK your doctor about these medications       amLODipine-olmesartan 5-20 MG per tablet  Commonly known as:  AZOR  Take 1 tablet by mouth daily as needed.     aspirin 81 MG tablet  Take 81 mg by mouth daily.     carvedilol 3.125 MG tablet  Commonly known as:  COREG  TAKE ONE TABLET BY MOUTH TWICE A DAY WITH MEALS     clopidogrel 75 MG tablet  Commonly known as:  PLAVIX  Take 1 tablet (75 mg total) by mouth daily.     furosemide 40 MG tablet  Commonly known as:  LASIX  TAKE ONE TABLET BY MOUTH ONE TIME DAILY     isosorbide mononitrate 30 MG 24 hr tablet  Commonly known as:  IMDUR  TAKE ONE HALF TABLET BY MOUTH EVERY DAY     levothyroxine 50 MCG tablet  Commonly known as:  SYNTHROID, LEVOTHROID  TAKE ONE TABLET BY MOUTH ONE TIME DAILY     nitroGLYCERIN 0.4 MG SL tablet  Commonly known as:  NITROSTAT  Place 1 tablet (0.4 mg total) under the tongue every 5 (five) minutes x 3 doses as needed for chest pain.     pravastatin 40 MG tablet  Commonly known as:  PRAVACHOL  Take 1 tablet (40 mg total) by mouth daily.           Lori [name], [place] 05/19/13 1549

## 2013-05-19 NOTE — ED Provider Notes (Signed)
Medical screening examination/treatment/procedure(s) were conducted as a shared visit with non-physician practitioner(s) and myself.  I personally evaluated the patient during the encounter  79F here with R leg/posterior leg pain. No trauma. Chronic R hip pain. No obvious swelling. Will Doppler for possible DVT. DVT study negative. Discharged.   Dagmar Hait, MD 05/19/13 2056

## 2013-05-25 ENCOUNTER — Observation Stay (HOSPITAL_COMMUNITY)
Admission: EM | Admit: 2013-05-25 | Discharge: 2013-05-26 | Disposition: A | Discharge status: Home / Self Care | Payer: Medicare Other | Attending: Family Medicine | Admitting: Family Medicine

## 2013-05-25 ENCOUNTER — Emergency Department (HOSPITAL_COMMUNITY): Payer: Medicare Other

## 2013-05-25 ENCOUNTER — Encounter (HOSPITAL_COMMUNITY): Payer: Self-pay | Admitting: Emergency Medicine

## 2013-05-25 DIAGNOSIS — M5126 Other intervertebral disc displacement, lumbar region: Secondary | ICD-10-CM | POA: Diagnosis not present

## 2013-05-25 DIAGNOSIS — M7130 Other bursal cyst, unspecified site: Secondary | ICD-10-CM | POA: Insufficient documentation

## 2013-05-25 DIAGNOSIS — M51379 Other intervertebral disc degeneration, lumbosacral region without mention of lumbar back pain or lower extremity pain: Secondary | ICD-10-CM | POA: Insufficient documentation

## 2013-05-25 DIAGNOSIS — I2510 Atherosclerotic heart disease of native coronary artery without angina pectoris: Secondary | ICD-10-CM | POA: Diagnosis not present

## 2013-05-25 DIAGNOSIS — I10 Essential (primary) hypertension: Secondary | ICD-10-CM | POA: Diagnosis not present

## 2013-05-25 DIAGNOSIS — M4800 Spinal stenosis, site unspecified: Secondary | ICD-10-CM

## 2013-05-25 DIAGNOSIS — R21 Rash and other nonspecific skin eruption: Secondary | ICD-10-CM | POA: Diagnosis not present

## 2013-05-25 DIAGNOSIS — M48061 Spinal stenosis, lumbar region without neurogenic claudication: Secondary | ICD-10-CM | POA: Diagnosis not present

## 2013-05-25 DIAGNOSIS — J9610 Chronic respiratory failure, unspecified whether with hypoxia or hypercapnia: Secondary | ICD-10-CM | POA: Diagnosis not present

## 2013-05-25 DIAGNOSIS — M79609 Pain in unspecified limb: Secondary | ICD-10-CM | POA: Insufficient documentation

## 2013-05-25 DIAGNOSIS — M5137 Other intervertebral disc degeneration, lumbosacral region: Secondary | ICD-10-CM | POA: Diagnosis not present

## 2013-05-25 DIAGNOSIS — Q762 Congenital spondylolisthesis: Secondary | ICD-10-CM | POA: Diagnosis not present

## 2013-05-25 DIAGNOSIS — M899 Disorder of bone, unspecified: Secondary | ICD-10-CM | POA: Diagnosis not present

## 2013-05-25 DIAGNOSIS — M1610 Unilateral primary osteoarthritis, unspecified hip: Secondary | ICD-10-CM | POA: Diagnosis not present

## 2013-05-25 DIAGNOSIS — E785 Hyperlipidemia, unspecified: Secondary | ICD-10-CM | POA: Insufficient documentation

## 2013-05-25 DIAGNOSIS — I509 Heart failure, unspecified: Secondary | ICD-10-CM | POA: Insufficient documentation

## 2013-05-25 DIAGNOSIS — M549 Dorsalgia, unspecified: Secondary | ICD-10-CM | POA: Diagnosis not present

## 2013-05-25 DIAGNOSIS — M47817 Spondylosis without myelopathy or radiculopathy, lumbosacral region: Secondary | ICD-10-CM | POA: Diagnosis not present

## 2013-05-25 DIAGNOSIS — M169 Osteoarthritis of hip, unspecified: Secondary | ICD-10-CM | POA: Diagnosis not present

## 2013-05-25 DIAGNOSIS — N289 Disorder of kidney and ureter, unspecified: Secondary | ICD-10-CM | POA: Insufficient documentation

## 2013-05-25 LAB — CBC WITH DIFFERENTIAL/PLATELET
HCT: 37.2 % (ref 36.0–46.0)
Hemoglobin: 12.5 g/dL (ref 12.0–15.0)
Lymphocytes Relative: 31 % (ref 12–46)
Lymphs Abs: 3.1 10*3/uL (ref 0.7–4.0)
Monocytes Absolute: 0.8 10*3/uL (ref 0.1–1.0)
Monocytes Relative: 8 % (ref 3–12)
Neutro Abs: 5.5 10*3/uL (ref 1.7–7.7)
Neutrophils Relative %: 55 % (ref 43–77)
RBC: 4.24 MIL/uL (ref 3.87–5.11)

## 2013-05-25 LAB — URINALYSIS, ROUTINE W REFLEX MICROSCOPIC
Bilirubin Urine: NEGATIVE
Glucose, UA: NEGATIVE mg/dL
Hgb urine dipstick: NEGATIVE
Nitrite: NEGATIVE
Specific Gravity, Urine: 1.01 (ref 1.005–1.030)
pH: 7 (ref 5.0–8.0)

## 2013-05-25 LAB — URINE MICROSCOPIC-ADD ON

## 2013-05-25 LAB — BASIC METABOLIC PANEL
BUN: 31 mg/dL — ABNORMAL HIGH (ref 6–23)
CO2: 28 mEq/L (ref 19–32)
Chloride: 99 mEq/L (ref 96–112)
GFR calc Af Amer: 43 mL/min — ABNORMAL LOW (ref >90)
Glucose, Bld: 98 mg/dL (ref 70–99)
Potassium: 4.7 mEq/L (ref 3.5–5.1)

## 2013-05-25 MED ORDER — HYDROCODONE-ACETAMINOPHEN 5-325 MG PO TABS
1.0000 | ORAL_TABLET | Freq: Once | ORAL | Status: AC
  Administered 2013-05-25: 1 via ORAL
  Filled 2013-05-25: qty 1

## 2013-05-25 MED ORDER — HYDROCODONE-ACETAMINOPHEN 5-325 MG PO TABS
1.0000 | ORAL_TABLET | ORAL | Status: DC | PRN
  Administered 2013-05-26: 2 via ORAL
  Administered 2013-05-26: 1 via ORAL
  Filled 2013-05-25: qty 2
  Filled 2013-05-25: qty 1
  Filled 2013-05-25: qty 2

## 2013-05-25 NOTE — ED Notes (Signed)
Pt c/o right leg pain with pain into hip and back x 2 week; pt sts seen at [facility] for same; pt denies recent fall; pt tearful

## 2013-05-25 NOTE — ED Provider Notes (Signed)
Lori [name] is a 80 y.o. female   Face-to-face evaluation   History: Right low back pain, radiating to right leg, present for one month without trauma. No associated urinary incontinence, fecal incontinence, or paresthesia. She cannot walk, secondary to pain. She has a chronic rash, whole body.  Physical exam: Elderly, alert, calm, cooperative. Moderate right low lumbar pain with movement. No pain on passive range of motion of the entire right leg.  Assessment: Nonspecific low back pain with radicular pain, in elderly patient. Plain images do not reveal source of the pain. MRI, ordered.  Medical screening examination/treatment/procedure(s) were conducted as a shared visit with non-physician practitioner(s) and myself.  I personally evaluated the patient during the encounter  Flint Melter, MD 05/27/13 1359

## 2013-05-25 NOTE — ED Notes (Signed)
PT and family updated on wait time for MRI.

## 2013-05-25 NOTE — ED Notes (Signed)
Pt transported to MRI dept for scan.

## 2013-05-25 NOTE — ED Provider Notes (Signed)
CSN: [id number]     Arrival date & time 05/25/13  1322 History   None    Chief Complaint  Patient presents with  . Leg Pain  . Back Pain    HPI  Lori [name] is a 79 year old female with a PMH of CAD, HTN, HLD, CHF, respiratory failure, and renal insufficiency who present to the ED for evaluation of leg and back pain.  Patient states that she has been having leg and back pain off and on for the past month.  She was seen on 05/18/13 for her pain and was prescribed Vicodin, which provides her temporary relief.  She did not follow-up with anyone after discharge. She states that her pain is so severe she is unable to ambulate.  She uses a walker and cane at home but still is having difficulty.  Her pain is mostly in her right upper back but radiates to her right thigh, knee, and foot.  Her pain is well controlled while laying and worse when she puts pressure on her foot.  She denies any loss of sensation, weakness, numbness, tingling, or loss of bowel/bladder function.  She states she had back pain in the past and was told she had "bone on bone" in her back from previous x-rays many years ago. She denies any recent injuries or trauma.  No hx of fx in the past to the lower extremities.  She denies any fever, chills, rhinorrhea, congestion, sore throat, cough, chest pain, SOB, abdominal pain, nausea, emesis, diarrhea, constipation, dysuria, or leg edema.  She has lesions on her back, which has been present for "years." and she has a rash on her upper thighs which is very itchy and has been present for weeks.  She has been putting creams on the rash, which doesn't seem to be helping.  She states she does not have a PCP. Patient lives with her husband.  Her daughter lives 2 houses away and helps with daily cares.       Past Medical History  Diagnosis Date  . Carotid artery stenosis     Left carotid endarterectomy 02/2001  . Hypertension   . Hyperlipidemia   . Thyroid disease     Euthyroid 02/2012  . CAD  (coronary artery disease)     a. NSTEMI in 2011 s/p PCI/DES to distal Cx, s/p PCI/DES to OM2 (EF 40%). b. NSTEMI 02/2012 treated conservatively.  . Arthritis   . CHF (congestive heart failure)     a. EF 40% in 2011. b. EF 35-40% by echo 03/12/12.  [name] Kitchen Respiratory failure     02/2012 - requiring bipap - multifactorial in setting of PNA/CHF/NSTEMI  . Pneumonia     02/2012  . Hyponatremia     Noted on 02/2012 admission  . Renal insufficiency     Noted on 02/2012 admission   Past Surgical History  Procedure Laterality Date  . Carotid endarterectomy      left 02/2001  . Cardiac catheterization     Family History  Problem Relation Age of Onset  . Prostate cancer    . Heart failure Mother 73  . Coronary artery disease Brother 81    CABG, alive at 41  . Coronary artery disease Brother 32    Died  . Heart failure Sister   . Heart failure Sister    History  Substance Use Topics  . Smoking status: Never Smoker   . Smokeless tobacco: Never Used  . Alcohol Use: No  OB History   Grav Para Term Preterm Abortions TAB SAB Ect Mult Living                 Review of Systems  Constitutional: Negative for fever, chills, activity change, appetite change and fatigue.  HENT: Negative for congestion, sore throat, rhinorrhea, neck pain and neck stiffness.   Eyes: Negative for visual disturbance.  Respiratory: Negative for cough and shortness of breath.   Cardiovascular: Negative for chest pain and leg swelling.  Gastrointestinal: Negative for nausea, vomiting, abdominal pain, diarrhea and constipation.  Genitourinary: Negative for dysuria and pelvic pain.  Musculoskeletal: Positive for back pain and gait problem. Negative for joint swelling.  Skin: Positive for rash. Negative for wound.  Neurological: Negative for dizziness, syncope, weakness, numbness and headaches.    Allergies  Review of patient's allergies indicates no known allergies.  Home Medications   Current Outpatient Rx  Name   Route  Sig  Dispense  Refill  . amLODipine-olmesartan (AZOR) 5-20 MG per tablet   Oral   Take 1 tablet by mouth daily as needed.   30 tablet   11   . aspirin 81 MG tablet   Oral   Take 81 mg by mouth daily.           . carvedilol (COREG) 3.125 MG tablet      TAKE ONE TABLET BY MOUTH TWICE A DAY WITH MEALS   60 tablet   0   . clopidogrel (PLAVIX) 75 MG tablet   Oral   Take 1 tablet (75 mg total) by mouth daily.   30 tablet   5   . furosemide (LASIX) 40 MG tablet      TAKE ONE TABLET BY MOUTH ONE TIME DAILY   30 tablet   0   . HYDROcodone-acetaminophen (NORCO/VICODIN) 5-325 MG per tablet   Oral   Take 1 tablet by mouth every 6 (six) hours as needed for pain.   15 tablet   0   . isosorbide mononitrate (IMDUR) 30 MG 24 hr tablet      TAKE ONE HALF TABLET BY MOUTH EVERY DAY   30 tablet   10   . levothyroxine (SYNTHROID, LEVOTHROID) 50 MCG tablet      TAKE ONE TABLET BY MOUTH ONE TIME DAILY   30 tablet   4   . pravastatin (PRAVACHOL) 40 MG tablet   Oral   Take 1 tablet (40 mg total) by mouth daily.   30 tablet   11   . EXPIRED: nitroGLYCERIN (NITROSTAT) 0.4 MG SL tablet   Sublingual   Place 1 tablet (0.4 mg total) under the tongue every 5 (five) minutes x 3 doses as needed for chest pain.   25 tablet   4    BP 159/60  Pulse 87  Temp(Src) 98.7 F (37.1 C) (Oral)  Resp 24  SpO2 98%  Filed Vitals:   05/25/13 2330 05/26/13 0007 05/26/13 0325 05/26/13 0612  BP: 143/60 175/56 148/54 148/48  Pulse: 72 82 70 75  Temp:  97.6 F (36.4 C)  97.6 F (36.4 C)  TempSrc:  Oral  Oral  Resp:    18  Height:  5\' 6"  (1.676 m)    Weight:  141 lb 14.4 oz (64.365 kg)    SpO2: 95% 100%  99%       Physical Exam  Nursing note and vitals reviewed. Constitutional: She is oriented to person, place, and time. She appears well-developed and well-nourished. No distress.  HENT:  Head: Normocephalic and atraumatic.  Right Ear: External ear normal.  Left Ear: External  ear normal.  Nose: Nose normal.  Mouth/Throat: Oropharynx is clear and moist. No oropharyngeal exudate.  Eyes: Conjunctivae are normal. Pupils are equal, round, and reactive to light. Right eye exhibits no discharge. Left eye exhibits no discharge.  Neck: Normal range of motion. Neck supple.  Cardiovascular: Normal rate, regular rhythm, normal heart sounds and intact distal pulses.  Exam reveals no gallop and no friction rub.   No murmur heard. Dorsalis pedis pulses present bilaterally  Pulmonary/Chest: Effort normal and breath sounds normal. No respiratory distress. She has no wheezes. She has no rales. She exhibits no tenderness.  Abdominal: Soft. Bowel sounds are normal. She exhibits no distension. There is no tenderness. There is no rebound and no guarding.  Musculoskeletal: Normal range of motion. She exhibits no edema and no tenderness.  Tenderness to palpation to the lower lumbar paraspinal muscles on the right.  No lumbar spinal tenderness.  Pain with right hip internal and external rotation. Patient able to flex right hip without difficulty. No palpable pain to the right leg throughout.  No edema or erythema to the legs bilaterally.  Strength 5/5 in the lower extremities bilaterally    Neurological: She is alert and oriented to person, place, and time.  Gross sensation intact in the lower extremities bilaterally.    Skin: Skin is warm and dry. She is not diaphoretic.  Patchy papular rash diffusely to the back which is white in color.  Red macular rash diffusely to the thighs in the intertriginous folds bilaterally and left lower back.       ED Course  Procedures (including critical care time) Labs Review Labs Reviewed  CBC WITH DIFFERENTIAL  BASIC METABOLIC PANEL  URINALYSIS, ROUTINE W REFLEX MICROSCOPIC   Imaging Review No results found.  Results for orders placed during the hospital encounter of 05/25/13  CBC WITH DIFFERENTIAL      Result Value Range   WBC 9.9  4.0 - 10.5  K/uL   RBC 4.24  3.87 - 5.11 MIL/uL   Hemoglobin 12.5  12.0 - 15.0 g/dL   HCT 16.1  09.6 - 04.5 %   MCV 87.7  78.0 - 100.0 fL   MCH 29.5  26.0 - 34.0 pg   MCHC 33.6  30.0 - 36.0 g/dL   RDW 40.9  81.1 - 91.4 %   Platelets 215  150 - 400 K/uL   Neutrophils Relative % 55  43 - 77 %   Neutro Abs 5.5  1.7 - 7.7 K/uL   Lymphocytes Relative 31  12 - 46 %   Lymphs Abs 3.1  0.7 - 4.0 K/uL   Monocytes Relative 8  3 - 12 %   Monocytes Absolute 0.8  0.1 - 1.0 K/uL   Eosinophils Relative 5  0 - 5 %   Eosinophils Absolute 0.5  0.0 - 0.7 K/uL   Basophils Relative 0  0 - 1 %   Basophils Absolute 0.0  0.0 - 0.1 K/uL  BASIC METABOLIC PANEL      Result Value Range   Sodium 137  135 - 145 mEq/L   Potassium 4.7  3.5 - 5.1 mEq/L   Chloride 99  96 - 112 mEq/L   CO2 28  19 - 32 mEq/L   Glucose, Bld 98  70 - 99 mg/dL   BUN 31 (*) 6 - 23 mg/dL   Creatinine, Ser 7.82 (*) 0.50 -  1.10 mg/dL   Calcium 9.4  8.4 - 16.1 mg/dL   GFR calc non Af Amer 37 (*) >90 mL/min   GFR calc Af Amer 43 (*) >90 mL/min  URINALYSIS, ROUTINE W REFLEX MICROSCOPIC      Result Value Range   Color, Urine YELLOW  YELLOW   APPearance CLEAR  CLEAR   Specific Gravity, Urine 1.010  1.005 - 1.030   pH 7.0  5.0 - 8.0   Glucose, UA NEGATIVE  NEGATIVE mg/dL   Hgb urine dipstick NEGATIVE  NEGATIVE   Bilirubin Urine NEGATIVE  NEGATIVE   Ketones, ur NEGATIVE  NEGATIVE mg/dL   Protein, ur NEGATIVE  NEGATIVE mg/dL   Urobilinogen, UA 0.2  0.0 - 1.0 mg/dL   Nitrite NEGATIVE  NEGATIVE   Leukocytes, UA LARGE (*) NEGATIVE  URINE MICROSCOPIC-ADD ON      Result Value Range   Squamous Epithelial / LPF MANY (*) RARE   WBC, UA 3-6  <3 WBC/hpf   Bacteria, UA RARE  RARE     DG Hip Complete Right (Final result)  Result time: 05/25/13 17:43:05    Final result by Rad Results In Interface (05/25/13 17:43:05)    Narrative:   *RADIOLOGY REPORT*  Clinical Data: Leg pain and back pain. No known injury.  RIGHT HIP - COMPLETE 2+  VIEW  Comparison: Lumbar spine radiographs 05/25/2013  Findings: The bones are osteopenic. No acute fracture is identified. The right hip is located.  Dense sclerosis about the inferior left sacroiliac joint is noted. No significant degenerative changes of the hips for patient age. Peripheral vascular atherosclerotic calcification of the femoral arteries bilaterally. Visualized bowel gas pattern nonobstructive.  IMPRESSION: 1. Decreased bony mineralization. No acute bony abnormality identified. 2. Marked sclerosis of the left sacroiliac joint inferiorly. This could reflect inflammatory sacroiliitis or significant degenerative changes.   Original Report Authenticated By: Britta Mccreedy, M.D.             DG Lumbar Spine Complete (Final result)  Result time: 05/25/13 17:28:05    Final result by Rad Results In Interface (05/25/13 17:28:05)    Narrative:   *RADIOLOGY REPORT*  Clinical Data: Leg pain and back pain  LUMBAR SPINE - COMPLETE 4+ VIEW  Comparison: None.  Findings:  Numbering of the lumbar spine assumes diminutive ribs at T12.  The significant bony demineralization. There is grade 1 anterolisthesis of L4 on L5, likely due to marked facet joint degenerative changes in the lower lumbar spine. There are no pars defects in the lumbar spine. There is some disc space narrowing at all levels, but most prominent at L4-L5. No acute fracture. Extensive atherosclerotic calcification of the aortoiliac vasculature. There is marked sclerosis about the inferior aspect of the left sacroiliac joint.  IMPRESSION:  1. Marked osseous demineralization. 2. No acute fracture identified. 3. Grade 1 anterolisthesis of L4 on L5 is likely secondary to marked lower lumbar spine facet joint degenerative change. 4. Prominent sclerosis of the left sacroiliac joint suggesting sacroiliitis or prominent degenerative change.   Original Report Authenticated By: Britta Mccreedy, M.D.          MR Lumbar Spine Wo Contrast (Final result)  Result time: 05/25/13 21:24:07    Final result by Rad Results In Interface (05/25/13 21:24:07)    Narrative:   *RADIOLOGY REPORT*  Clinical Data: Lumbar radiculopathy.  MRI LUMBAR SPINE WITHOUT CONTRAST  Technique: Multiplanar and multiecho pulse sequences of the lumbar spine were obtained without intravenous contrast.  Comparison: Radiography from the same  day.  Findings:  No marrow signal abnormality suggestive of fracture, infection, neoplasm. Sclerotic and edematous changes surrounding the L4-5 interspace, worst on the left, are considered degenerative. No endplate erosion.  Normal conus signal and morphology.  Multilevel degenerative disc and facet disease:  L1-2: Grade 1 anterolisthesis with disc uncovering/bulge combining with dorsal ligamentous/facet overgrowth to cause mild canal stenosis. Disc effaces the inferior foramina, without neural impingement.  L2-3: Limited degeneration for age.  L3-4: Disc bulging combines with dorsal ligamentous and facet overgrowth to narrow the lateral recesses, left more than right. No significant foraminal compromise.  L4-5: Grade 1, borderline grade 2, anterolisthesis related to advanced facet osteoarthritis. The slip combined with the dorsal disc bulging, ligamentous/facet overgrowth, and 4 x 2 mm right- sided ligamentous or synovial cyst causes severe spinal canal stenosis. Advanced left foraminal compromise from the same.  L5-S1: Broad-based disc bulging crowds the lateral recesses bilaterally when combined with dorsal ligamentous overgrowth. Facet degeneration with left effusion.  Incidental findings include extensive colonic diverticulosis. Endometrial stripe/canal 3 mm in thickness.  IMPRESSION: 1. Severe degenerative spinal canal stenosis at L4-5. The stenosis is multifactorial, including anterolisthesis and right-sided synovial or ligamentous cyst.  2. Lateral  recess stenosis at L3-4 and L5-S1 from a combination of disc and facet degeneration.   Original Report Authenticated By: Tiburcio Pea         MDM   1. Spinal stenosis   2. Spinal stenosis of lumbar region      Prairie T Lacomb is a 79 year old female with a PMH of CAD, HTN, HLD, CHF, respiratory failure, and renal insufficiency who presents to the ED for evaluation of leg and back pain.   UA, CBC, BMP ordered.  X-rays of right hip and lumbar spine ordered.  Vicodin ordered for pain. Patient recently had a ultrasound of the right lower extremity which is negative for DVT.   RIGHT LOWER EXTREMITY VENOUS DOPPLER ULTRASOUND (05/18/13) Technique: Gray-scale sonography with compression, as well as color  and duplex ultrasound, were performed to evaluate the deep venous  system from the level of the common femoral vein through the  popliteal and proximal calf veins.  Comparison: None  Findings: Normal compressibility of the common femoral,  superficial femoral, and popliteal veins, as well as the proximal  calf veins. No filling defects to suggest DVT on grayscale or  color Doppler imaging. Doppler waveforms show normal direction of  venous flow, normal respiratory phasicity and response to  augmentation.  IMPRESSION:  No evidence of lower extremity deep vein thrombosis.  Original Report Authenticated By: D. Andria Rhein, MD    Rechecks  10:15 PM = patient is resting comfortably in no acute distress. Her pain is well-controlled.   Consults  10:14 PM = Spoke with neurosurgeon Dr. Venetia Maxon who is going to look at her films and call me back.   10:30 PM = spoke with neurosurgeon Dr. Venetia Maxon who would like to admit the patient under the hospitalist service and he will see her in the morning for possible injections and surgical discussion. 10:55 PM = Spoke with Dr. Julian Reil who agrees to admit under team 10 med-surg.     Etiology of back pain likely due to to spinal stenosis. Patient's  pain was well-controlled in the emergency department with Vicodin. Patient will be admitted for further evaluation and management.  Patient will be see by neurosurgery tomorrow. She remained in no acute distress throughout her ED visit. Urine will be sent for culture.  Patient  has a rash which appears to be chronic in nature.  She states she has a dermatologist who is managing her condition.  Patient and family was in agreement with admission and plan.    Final impressions: 1. Spinal stenosis  2. Rash, chronic      Luiz Iron PA-C    This patient was discussed with Dr. Effie Shy who also evaluated the patient.     Jillyn Ledger, PA-C 05/26/13 [phone]

## 2013-05-25 NOTE — ED Notes (Signed)
Pt presents with right leg pain for the past month that has become more painful over the past week.  States that the pain starts in her hip and radiates down her right leg.  CNS intact.  Able to bear weight on leg.

## 2013-05-26 DIAGNOSIS — M4310 Spondylolisthesis, site unspecified: Secondary | ICD-10-CM | POA: Diagnosis not present

## 2013-05-26 DIAGNOSIS — M48061 Spinal stenosis, lumbar region without neurogenic claudication: Secondary | ICD-10-CM | POA: Diagnosis present

## 2013-05-26 DIAGNOSIS — M4800 Spinal stenosis, site unspecified: Secondary | ICD-10-CM | POA: Diagnosis not present

## 2013-05-26 MED ORDER — ISOSORBIDE MONONITRATE 15 MG HALF TABLET
15.0000 mg | ORAL_TABLET | Freq: Every day | ORAL | Status: DC
  Administered 2013-05-26: 15 mg via ORAL
  Filled 2013-05-26: qty 1

## 2013-05-26 MED ORDER — PREDNISONE 50 MG PO TABS
60.0000 mg | ORAL_TABLET | Freq: Every day | ORAL | Status: DC
  Administered 2013-05-26: 60 mg via ORAL
  Filled 2013-05-26 (×2): qty 1

## 2013-05-26 MED ORDER — CARVEDILOL 3.125 MG PO TABS
3.1250 mg | ORAL_TABLET | Freq: Two times a day (BID) | ORAL | Status: DC
  Administered 2013-05-26: 3.125 mg via ORAL
  Filled 2013-05-26 (×3): qty 1

## 2013-05-26 MED ORDER — IRBESARTAN 150 MG PO TABS
150.0000 mg | ORAL_TABLET | Freq: Every day | ORAL | Status: DC
  Administered 2013-05-26: 150 mg via ORAL
  Filled 2013-05-26: qty 1

## 2013-05-26 MED ORDER — HYDROCODONE-ACETAMINOPHEN 5-325 MG PO TABS: 1.0000 | ORAL_TABLET | Freq: Three times a day (TID) | ORAL | Status: DC | PRN

## 2013-05-26 MED ORDER — PREDNISONE 20 MG PO TABS: 60.0000 mg | ORAL_TABLET | Freq: Every day | ORAL | Status: DC

## 2013-05-26 MED ORDER — CLOPIDOGREL BISULFATE 75 MG PO TABS
75.0000 mg | ORAL_TABLET | Freq: Every day | ORAL | Status: DC
  Administered 2013-05-26: 75 mg via ORAL
  Filled 2013-05-26 (×2): qty 1

## 2013-05-26 MED ORDER — ASPIRIN EC 81 MG PO TBEC
81.0000 mg | DELAYED_RELEASE_TABLET | Freq: Every day | ORAL | Status: DC
  Administered 2013-05-26: 81 mg via ORAL
  Filled 2013-05-26: qty 1

## 2013-05-26 MED ORDER — HEPARIN SODIUM (PORCINE) 5000 UNIT/ML IJ SOLN
5000.0000 [IU] | Freq: Three times a day (TID) | INTRAMUSCULAR | Status: DC
  Administered 2013-05-26: 5000 [IU] via SUBCUTANEOUS
  Filled 2013-05-26 (×3): qty 1

## 2013-05-26 MED ORDER — SIMVASTATIN 20 MG PO TABS
20.0000 mg | ORAL_TABLET | Freq: Every day | ORAL | Status: DC
  Filled 2013-05-26: qty 1

## 2013-05-26 MED ORDER — AMLODIPINE-OLMESARTAN 5-20 MG PO TABS: 1.0000 | ORAL_TABLET | Freq: Every day | ORAL | Status: DC

## 2013-05-26 MED ORDER — FUROSEMIDE 40 MG PO TABS
40.0000 mg | ORAL_TABLET | Freq: Every day | ORAL | Status: DC
  Administered 2013-05-26: 40 mg via ORAL
  Filled 2013-05-26: qty 1

## 2013-05-26 MED ORDER — LEVOTHYROXINE SODIUM 50 MCG PO TABS
50.0000 ug | ORAL_TABLET | Freq: Every day | ORAL | Status: DC
  Administered 2013-05-26: 50 ug via ORAL
  Filled 2013-05-26 (×2): qty 1

## 2013-05-26 MED ORDER — AMLODIPINE BESYLATE 5 MG PO TABS
5.0000 mg | ORAL_TABLET | Freq: Every day | ORAL | Status: DC
  Administered 2013-05-26: 5 mg via ORAL
  Filled 2013-05-26: qty 1

## 2013-05-26 NOTE — Progress Notes (Signed)
Physical Therapy Treatment Patient Details Name: Lori [name] MRN: [id number] DOB: 04/14/1919 Today's Date: 05/26/2013 Time: 4098-1191 PT Time Calculation (min): 31 min  PT Assessment / Plan / Recommendation  History of Present Illness Pt is a 79 yo female admitted with back pain so severe that she was unable to bear weight on her feet and walk.  Pt has a h/o lumber back problems including spinal stenosis at several levels.  Neurosurgery has decided to attempt to manage pt with some injections that will start next Wednesday.    PT Comments   Pt mobilizing better after pain meds (with less pain), however reporting dizziness due to pain medicine. Daughter present throughout session and both PT and OT emphasized need for pt to be supervised 24/7 due to impulsivity and decr safety while on pain medicine. Spoke with Dr. Mahala [name] re: pt's request to discuss pain medicine and muscle relaxers (she feels the one's she is on are too strong for her). He requested orthostatic BPs be completed and RN made aware.   Follow Up Recommendations  Home health PT;Supervision/Assistance - 24 hour     Does the patient have the potential to tolerate intense rehabilitation     Barriers to Discharge Decreased caregiver support family to arrange 24/7 assist    Equipment Recommendations  Rolling walker with 5" wheels    Recommendations for Other Services OT consult  Frequency Min 3X/week   Progress towards PT Goals Progress towards PT goals: Progressing toward goals  Plan Current plan remains appropriate    Precautions / Restrictions Precautions Precautions: Fall;Back Precaution Comments: Recommend pt follow standard back precautions to protect her spine as much as possible and hopefully avoid surgery. Restrictions Weight Bearing Restrictions: No   Pertinent Vitals/Pain 6/10 back and right leg; patient repositioned for comfort     Mobility  Bed Mobility Bed Mobility: Not assessed (pt deferred wanting  to sit up ) Rolling Right: 5: Supervision;With rail Right Sidelying to Sit: 5: Supervision;With rails;HOB elevated Sitting - Scoot to Edge of Bed: 5: Supervision Details for Bed Mobility Assistance: pt impulsive; would not slow down for bed to be flattened and rail removed to simulate home environment; asked pt to return to supine to repeat and she was near tears due to pain and deferred further bed mobility education Transfers Transfers: Sit to Stand;Stand to Sit Sit to Stand: 4: Min guard Stand to Sit: 4: Min guard Details for Transfer Assistance: pt less impulsive (but required cues/direction!); vc for safe use of RW; repeated x 3 Ambulation/Gait Ambulation/Gait Assistance: 4: Min assist Ambulation Distance (Feet): 25 Feet Assistive device: Rolling walker Ambulation/Gait Assistance Details: pt with less pain after pain medicine, however after 25 ft began to feel the spasms starting in her Rt leg and chair brought to pt to sit; vc for safe/proper use of RW (pt tends to lift to advance, cues to stay closer) Gait Pattern: Step-to pattern;Decreased stride length;Right flexed knee in stance;Antalgic;Trunk flexed Stairs: Yes Stairs Assistance: 4: Min assist Stairs Assistance Details (indicate cue type and reason): assist to lift and lower RW; steady assist due to dizzy Stair Management Technique: Step to pattern;Forwards;With walker (curb technique as pt has 1+1 set-up) Number of Stairs: 1    Exercises     PT Diagnosis: Difficulty walking;Acute pain  PT Problem List: Decreased activity tolerance;Decreased balance;Decreased mobility;Decreased knowledge of use of DME;Decreased safety awareness;Decreased knowledge of precautions;Pain PT Treatment Interventions: DME instruction;Gait training;Stair training;Functional mobility training;Patient/family education   PT Goals (current goals can now  be found in the care plan section) Acute Rehab PT Goals Patient Stated Goal: to go home today PT Goal  Formulation: With patient Time For Goal Achievement: 05/29/13 Potential to Achieve Goals: Good  Visit Information  Last PT Received On: 05/26/13 Assistance Needed: +1 History of Present Illness: Pt is a 79 yo female admitted with back pain so severe that she was unable to bear weight on her feet and walk.  Pt has a h/o lumber back problems including spinal stenosis at several levels.  Neurosurgery has decided to attempt to manage pt with some injections that will start next Wednesday.     Subjective Data  Subjective: I don't like the way this medicine makes me feel. I feel dizzy and like I could fall Patient Stated Goal: to go home today   Cognition  Cognition Arousal/Alertness: Awake/alert Behavior During Therapy: Impulsive Overall Cognitive Status: Within Functional Limits for tasks assessed    Balance  Balance Balance Assessed: No  End of Session PT - End of Session Equipment Utilized During Treatment: Gait belt Activity Tolerance: Patient limited by pain Patient left: in chair;with call bell/phone within reach;with family/visitor present Nurse Communication: Other (comment) (MD wants orthostatic BPs done)   GP Functional Assessment Tool Used: clinical judgement Functional Limitation: Mobility: Walking and moving around Mobility: Walking and Moving Around Current Status (E4540): At least 20 percent but less than 40 percent impaired, limited or restricted Mobility: Walking and Moving Around Goal Status [phone]): At least 1 percent but less than 20 percent impaired, limited or restricted   Lori [name] 05/26/2013, 2:16 PM Pager [phone]

## 2013-05-26 NOTE — Progress Notes (Signed)
Patient ID: Lori [name], female   DOB: [birth year], 79 y.o.   MRN: [id number]   [facility] referred to [facility], informed "Dr. Luz Brazen no longer practices" & [name] is not their patient. Will investigate further.  Pt saw Dr. Antoine Poche in March. Spoke with Dr. Myrtis Ser with [facility].  He will call back with recommendations re: if ok to stop plavix & how long to hold before injection.  Georgiann Cocker, RN, BSN  1000 Dr Myrtis Ser called to say It is ok to stop Plavix (and Aspirin if must) for injection, resuming as soon after procedure as deemed appropriate by MD performing injection.   Georgiann Cocker, RN, BSN

## 2013-05-26 NOTE — Discharge Summary (Signed)
Physician Discharge Summary  Lori [name] ZOX:[id number] DOB: [birth year] DOA: 05/25/2013  PCP: Rudi Heap, MD  Admit date: 05/25/2013 Discharge date: 05/26/2013  Time spent: 35 minutes  Recommendations for Outpatient Follow-up:  1. Patient instructed to followup with Dr. Lezlie Lye office next week 2. Patient to get and complete 5 days of oral prednisone 60 mg stop date 06/01/2013 3. Continue medications prior to admission 4. Patient may benefit from outpatient physical/occupational therapy for what I suspect is vertigo  Discharge Diagnoses:  Principal Problem:   Spinal stenosis of lumbar region   Discharge Condition: good  Diet recommendation: Regular low salt  Filed Weights   05/26/13 0007  Weight: 64.365 kg (141 lb 14.4 oz)    History of present illness:  79 year old Caucasian female with known CAD, lower back pain, carotid artery stenosis status post endarterectomy 2002, CHF, pneumonia, thyroid disease-presented with severe back pain 05/26/2013 a.m. Patient ultimately had MRI of the lower back showing significant grade 2 spondylolisthesis L4-5 and neurosurgeon Dr. Venetia Maxon was consulted. Patient was noted to be on aspirin and, which at [facility] take the procedure of choice, epidural steroid injection-patient has been requested to followup with Dr. Venetia Maxon in the outpatient setting Patient will receive 60 mg of prednisone daily till 06/01/2013 and then will he seen by Dr. Venetia Maxon for definitive management, either injection vs. operative management of the same. Patient did experience some weakness and was recommended to have home health PT and OT follow the patient at home  she has long-standing dizziness which she attributes to vertigo-when she turns her head in specific directions this reproduces this. Orthostatics done during hospital stay were  Procedures:  MRI lower back  Consultations:  Neurosurgery  Discharge Exam: Filed Vitals:   05/26/13 0959  BP: 118/47  Pulse: 80  Temp:  97.8 F (36.6 C)  Resp: 18   Alert pleasant oriented no apparent distress  General: EOMI looks younger than stated age, no pallor no icterus Cardiovascular: S1-S2 regular rate rhythm Respiratory: Clinically clear  Discharge Instructions  Discharge Orders   Future Orders Complete By Expires   Diet - low sodium heart healthy  As directed    Increase activity slowly  As directed    PR TUB STOOL OR BENCH  As directed        Medication List         amLODipine-olmesartan 5-20 MG per tablet  Commonly known as:  AZOR  Take 1 tablet by mouth daily as needed.     aspirin 81 MG tablet  Take 81 mg by mouth daily.     carvedilol 3.125 MG tablet  Commonly known as:  COREG  TAKE ONE TABLET BY MOUTH TWICE A DAY WITH MEALS     clopidogrel 75 MG tablet  Commonly known as:  PLAVIX  Take 1 tablet (75 mg total) by mouth daily.     furosemide 40 MG tablet  Commonly known as:  LASIX  TAKE ONE TABLET BY MOUTH ONE TIME DAILY     HYDROcodone-acetaminophen 5-325 MG per tablet  Commonly known as:  NORCO/VICODIN  Take 1 tablet by mouth every 6 (six) hours as needed for pain.     HYDROcodone-acetaminophen 5-325 MG per tablet  Commonly known as:  NORCO/VICODIN  Take 1 tablet by mouth every 8 (eight) hours as needed.     isosorbide mononitrate 30 MG 24 hr tablet  Commonly known as:  IMDUR  TAKE ONE HALF TABLET BY MOUTH EVERY DAY     levothyroxine 50  MCG tablet  Commonly known as:  SYNTHROID, LEVOTHROID  TAKE ONE TABLET BY MOUTH ONE TIME DAILY     nitroGLYCERIN 0.4 MG SL tablet  Commonly known as:  NITROSTAT  Place 1 tablet (0.4 mg total) under the tongue every 5 (five) minutes x 3 doses as needed for chest pain.     pravastatin 40 MG tablet  Commonly known as:  PRAVACHOL  Take 1 tablet (40 mg total) by mouth daily.     predniSONE 20 MG tablet  Commonly known as:  DELTASONE  Take 3 tablets (60 mg total) by mouth daily before breakfast.       No Known Allergies     Follow-up  Information   Follow up with Rudi Heap, MD.   Specialty:  Family Medicine   Contact information:   [address] [place] [address] [phone]       Follow up with Dorian Heckle, MD. (call his office to get time off follwo up appt.)    Specialty:  Neurosurgery   Contact information:   [address] [place] [address] [phone]        The results of significant diagnostics from this hospitalization (including imaging, microbiology, ancillary and laboratory) are listed below for reference.    Significant Diagnostic Studies: Dg Lumbar Spine Complete  05/25/2013   *RADIOLOGY REPORT*  Clinical Data: Leg pain and back pain  LUMBAR SPINE - COMPLETE 4+ VIEW  Comparison: None.  Findings:  Numbering of the lumbar spine assumes diminutive ribs at T12.  The significant bony demineralization.  There is grade 1 anterolisthesis of L4 on L5, likely due to marked facet joint degenerative changes in the lower lumbar spine. There are no pars defects in the lumbar spine.  There is some disc space narrowing at all levels, but most prominent at L4-L5.  No acute fracture. Extensive atherosclerotic calcification of the aortoiliac vasculature. There is marked sclerosis about the inferior aspect of the left sacroiliac joint.  IMPRESSION:  1.  Marked osseous demineralization. 2.  No acute fracture identified. 3. Grade 1 anterolisthesis of L4 on L5 is likely secondary to marked lower lumbar spine facet joint degenerative change. 4.  Prominent sclerosis of the left sacroiliac joint suggesting sacroiliitis or prominent degenerative change.   Original Report Authenticated By: Britta Mccreedy, M.D.   Dg Hip Complete Right  05/25/2013   *RADIOLOGY REPORT*  Clinical Data: Leg pain and back pain.  No known injury.  RIGHT HIP - COMPLETE 2+ VIEW  Comparison: Lumbar spine radiographs 05/25/2013  Findings: The bones are osteopenic.  No acute fracture is identified.  The right  hip is located.  Dense sclerosis about the inferior left sacroiliac joint is noted. No significant degenerative changes of the hips for patient age. Peripheral vascular atherosclerotic calcification of the femoral arteries bilaterally.  Visualized bowel gas pattern nonobstructive.  IMPRESSION: 1.  Decreased bony mineralization. No acute bony abnormality identified.                              2.  Marked sclerosis of the left sacroiliac joint inferiorly.  This could reflect inflammatory sacroiliitis or significant degenerative changes.   Original Report Authenticated By: Britta Mccreedy, M.D.   Mr Lumbar Spine Wo Contrast  05/25/2013   *RADIOLOGY REPORT*  Clinical Data: Lumbar radiculopathy.  MRI LUMBAR SPINE WITHOUT CONTRAST  Technique:  Multiplanar and multiecho pulse sequences of the  lumbar spine were obtained without intravenous contrast.  Comparison: Radiography from the same day.  Findings:  No marrow signal abnormality suggestive of fracture, infection, neoplasm.  Sclerotic and edematous changes surrounding the L4-5 interspace, worst on the left, are considered degenerative.   No endplate erosion.  Normal conus signal and morphology.  Multilevel degenerative disc and facet disease:  L1-2:  Grade 1 anterolisthesis with disc uncovering/bulge combining with dorsal ligamentous/facet overgrowth to cause mild canal stenosis.  Disc effaces the inferior foramina, without neural impingement.  L2-3:  Limited degeneration for age.  L3-4:  Disc bulging combines with dorsal ligamentous and facet overgrowth to narrow the lateral recesses, left more than right. No significant foraminal compromise.  L4-5:  Grade 1, borderline grade 2, anterolisthesis related to advanced facet osteoarthritis.  The slip combined with the dorsal disc bulging, ligamentous/facet overgrowth, and 4 x 2 mm right- sided ligamentous or synovial cyst causes severe spinal canal stenosis.  Advanced left foraminal compromise from the same.  L5-S1:   Broad-based disc bulging crowds the lateral recesses bilaterally when combined with dorsal ligamentous overgrowth. Facet degeneration with left effusion.  Incidental findings include extensive colonic diverticulosis. Endometrial stripe/canal 3 mm in thickness.  IMPRESSION: 1.  Severe degenerative spinal canal stenosis at L4-5. The stenosis is multifactorial, including anterolisthesis and right-sided synovial or ligamentous cyst.  2.  Lateral recess stenosis at L3-4 and L5-S1 from a combination of disc and facet degeneration.   Original Report Authenticated By: Tiburcio Pea   US Venous Img Lower Unilateral Right  05/18/2013   *RADIOLOGY REPORT*  Clinical Data: Redness, pain.  RIGHT LOWER EXTREMITY VENOUS DOPPLER ULTRASOUND  Technique: Gray-scale sonography with compression, as well as color and duplex ultrasound, were performed to evaluate the deep venous system from the level of the common femoral vein through the popliteal and proximal calf veins.  Comparison: None  Findings:  Normal compressibility of  the common femoral, superficial femoral, and popliteal veins, as well as the proximal calf veins.  No filling defects to suggest DVT on grayscale or color Doppler imaging.  Doppler waveforms show normal direction of venous flow, normal respiratory phasicity and response to augmentation.  IMPRESSION: No evidence of  lower extremity deep vein thrombosis.   Original Report Authenticated By: D. Andria Rhein, MD    Microbiology: No results found for this or any previous visit (from the past 240 hour(s)).   Labs: Basic Metabolic Panel:  Recent Labs Lab 05/25/13 1615  NA 137  K 4.7  CL 99  CO2 28  GLUCOSE 98  BUN 31*  CREATININE 1.22*  CALCIUM 9.4   Liver Function Tests: No results found for this basename: AST, ALT, ALKPHOS, BILITOT, PROT, ALBUMIN,  in the last 168 hours No results found for this basename: LIPASE, AMYLASE,  in the last 168 hours No results found for this basename: AMMONIA,  in  the last 168 hours CBC:  Recent Labs Lab 05/25/13 1615  WBC 9.9  NEUTROABS 5.5  HGB 12.5  HCT 37.2  MCV 87.7  PLT 215   Cardiac Enzymes: No results found for this basename: CKTOTAL, CKMB, CKMBINDEX, TROPONINI,  in the last 168 hours BNP: BNP (last 3 results) No results found for this basename: PROBNP,  in the last 8760 hours CBG: No results found for this basename: GLUCAP,  in the last 168 hours     Signed:  Rhetta Mura  [facility] 05/26/2013, 1:54 PM

## 2013-05-26 NOTE — Progress Notes (Signed)
Discharge instructions given. Patient verbalized understanding and all questions were answered.  ?

## 2013-05-26 NOTE — Progress Notes (Signed)
Subjective: Patient reports "I don't hurt any now.[name] KitchenMarland KitchenIt's when I stand up & put my foot on the floor"  Objective: Vital signs in last 24 hours: Temp:  [97.6 F (36.4 C)-98.7 F (37.1 C)] 97.6 F (36.4 C) (09/05 0612) Pulse Rate:  [70-87] 75 (09/05 0612) Resp:  [18-24] 18 (09/05 0612) BP: (74-175)/(45-70) 148/48 mmHg (09/05 0612) SpO2:  [95 %-100 %] 99 % (09/05 0612) Weight:  [64.365 kg (141 lb 14.4 oz)] 64.365 kg (141 lb 14.4 oz) (09/05 0007)  Intake/Output from previous day:   Intake/Output this shift:    Alert, conversant (HOH), and very pleasant female in hospital bed - high Fowlers position without c/o pain or discomfort. She reports she came to ED because of severe right leg pain buttock to right heel with standing/walking. Neurologically intact. Good strength BLE, unable to ellicit pain in bed.  She states "I'm not going to have surgery. I'm 89..too old for that." She does verbalize understanding of the pathology causing her pain and is agreeable to an injection and course of PT. She understands that surgery to correct the structural problem would be a remaining option if injection & PT do not help. 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 She states Dr. Luz Brazen rx'ed the Plavix.    Lab Results:  Recent Labs  05/25/13 1615  WBC 9.9  HGB 12.5  HCT 37.2  PLT 215   BMET  Recent Labs  05/25/13 1615  NA 137  K 4.7  CL 99  CO2 28  GLUCOSE 98  BUN 31*  CREATININE 1.22*  CALCIUM 9.4    Studies/Results: Dg Lumbar Spine Complete  05/25/2013   *RADIOLOGY REPORT*  Clinical Data: Leg pain and back pain  LUMBAR SPINE - COMPLETE 4+ VIEW  Comparison: None.  Findings:  Numbering of the lumbar spine assumes diminutive ribs at T12.  The significant bony demineralization.  There is grade 1 anterolisthesis of L4 on L5, likely due to marked facet joint degenerative changes in the lower lumbar spine. There are no pars defects in  the lumbar spine.  There is some disc space narrowing at all levels, but most prominent at L4-L5.  No acute fracture. Extensive atherosclerotic calcification of the aortoiliac vasculature. There is marked sclerosis about the inferior aspect of the left sacroiliac joint.  IMPRESSION:  1.  Marked osseous demineralization. 2.  No acute fracture identified. 3. Grade 1 anterolisthesis of L4 on L5 is likely secondary to marked lower lumbar spine facet joint degenerative change. 4.  Prominent sclerosis of the left sacroiliac joint suggesting sacroiliitis or prominent degenerative change.   Original Report Authenticated By: Britta Mccreedy, M.D.   Dg Hip Complete Right  05/25/2013   *RADIOLOGY REPORT*  Clinical Data: Leg pain and back pain.  No known injury.  RIGHT HIP - COMPLETE 2+ VIEW  Comparison: Lumbar spine radiographs 05/25/2013  Findings: The bones are osteopenic.  No acute fracture is identified.  The right hip is located.  Dense sclerosis about the inferior left sacroiliac joint is noted. No significant degenerative changes of the hips for patient age. Peripheral vascular atherosclerotic calcification of the femoral arteries bilaterally.  Visualized bowel gas pattern nonobstructive.  IMPRESSION: 1.  Decreased bony mineralization. No acute bony abnormality identified.                              2.  Marked sclerosis  of the left sacroiliac joint inferiorly.  This could reflect inflammatory sacroiliitis or significant degenerative changes.   Original Report Authenticated By: Britta Mccreedy, M.D.   Mr Lumbar Spine Wo Contrast  05/25/2013   *RADIOLOGY REPORT*  Clinical Data: Lumbar radiculopathy.  MRI LUMBAR SPINE WITHOUT CONTRAST  Technique:  Multiplanar and multiecho pulse sequences of the lumbar spine were obtained without intravenous contrast.  Comparison: Radiography from the same day.  Findings:  No marrow signal abnormality suggestive of fracture, infection, neoplasm.  Sclerotic and edematous changes surrounding  the L4-5 interspace, worst on the left, are considered degenerative.   No endplate erosion.  Normal conus signal and morphology.  Multilevel degenerative disc and facet disease:  L1-2:  Grade 1 anterolisthesis with disc uncovering/bulge combining with dorsal ligamentous/facet overgrowth to cause mild canal stenosis.  Disc effaces the inferior foramina, without neural impingement.  L2-3:  Limited degeneration for age.  L3-4:  Disc bulging combines with dorsal ligamentous and facet overgrowth to narrow the lateral recesses, left more than right. No significant foraminal compromise.  L4-5:  Grade 1, borderline grade 2, anterolisthesis related to advanced facet osteoarthritis.  The slip combined with the dorsal disc bulging, ligamentous/facet overgrowth, and 4 x 2 mm right- sided ligamentous or synovial cyst causes severe spinal canal stenosis.  Advanced left foraminal compromise from the same.  L5-S1:  Broad-based disc bulging crowds the lateral recesses bilaterally when combined with dorsal ligamentous overgrowth. Facet degeneration with left effusion.  Incidental findings include extensive colonic diverticulosis. Endometrial stripe/canal 3 mm in thickness.  IMPRESSION: 1.  Severe degenerative spinal canal stenosis at L4-5. The stenosis is multifactorial, including anterolisthesis and right-sided synovial or ligamentous cyst.  2.  Lateral recess stenosis at L3-4 and L5-S1 from a combination of disc and facet degeneration.   Original Report Authenticated By: Tiburcio Pea    Assessment/Plan: Limited by pain, neurologically intact  LOS: 1 day  With no neuro deficits, but pain severely limiting mobility, will plan an epidural/nerve root block injection and Physical Therapy for pain control and safe ROM education. If no benefit, and pt agrees, the remaining option would be posterior lumbar decompression and fusion at L4-5 for the spondylolisthesis and stenosis. Her Plavix dosing may delay the planned injection.  Will check with pt's cardiologist re: stopping Plavix. Neurosurgery can follow on an outpatient basis if her pain is controlled and allows for d/c home.    Georgiann Cocker [phone] 05/26/2013, 9:01 AM

## 2013-05-26 NOTE — Evaluation (Signed)
Physical Therapy Evaluation Patient Details Name: Lori [name] MRN: [id number] DOB: 11-08-1918 Today's Date: 05/26/2013 Time: 4098-1191 PT Time Calculation (min): 32 min  PT Assessment / Plan / Recommendation History of Present Illness  Pt is a 79 yo female admitted with back pain so severe that she was unable to bear weight on her feet and walk.  Pt has a h/o lumber back problems including spinal stenosis at several levels.  Neurosurgery has decided to attempt to manage pt with some injections that will start next Wednesday.    Clinical Impression  Pt admitted with back and Rt leg pain. Neurosurgery has been consulted due to severe spondylolisthesis and recommended surgery, however pt prefers to try injections. This cannot be done until next week due to pt on Plavix and needs to be held.   Pt currently with functional limitations due to the deficits listed below (see PT Problem List). Pt currently unsafe to d/c home with family due to excessive pain and near fall with PT. Pt receiving pain medicine and will see her again after meds take effect to see if pt can go home today.    PT Assessment  Patient needs continued PT services    Follow Up Recommendations  Home health PT;Supervision/Assistance - 24 hour    Does the patient have the potential to tolerate intense rehabilitation      Barriers to Discharge Decreased caregiver support family to arrange 24/7 assist    Equipment Recommendations  Rolling walker with 5" wheels    Recommendations for Other Services OT consult   Frequency Min 3X/week    Precautions / Restrictions Precautions Precautions: Fall;Back Precaution Comments: Recommend pt follow standard back precautions to protect her spine as much as possible and hopefully avoid surgery. Restrictions Weight Bearing Restrictions: No   Pertinent Vitals/Pain 10/10 with standing (back and down Rt leg); patient repositioned for comfort ; RN provided medication to assist with  pain control       Mobility  Bed Mobility Bed Mobility: Rolling Right;Right Sidelying to Sit;Sitting - Scoot to Edge of Bed Rolling Right: 5: Supervision;With rail Right Sidelying to Sit: 5: Supervision;With rails;HOB elevated Sitting - Scoot to Edge of Bed: 5: Supervision Details for Bed Mobility Assistance: pt impulsive; would not slow down for bed to be flattened and rail removed to simulate home environment; asked pt to return to supine to repeat and she was near tears due to pain and deferred further bed mobility education Transfers Transfers: Sit to Stand;Stand to Sit Sit to Stand: 3: Mod assist Stand to Sit: 4: Min assist Details for Transfer Assistance: from bed pt insisted on pulling up on walker; it tipped posteriorly and pt leans posteriorly as she comes up, requiring assist to come forward over her base of support Ambulation/Gait Ambulation/Gait Assistance: 3: Mod assist Ambulation Distance (Feet): 5 Feet Assistive device: Rolling walker Ambulation/Gait Assistance Details: pt with incr Rt leg pain/spasms as she began to walk; Rt leg buckled with pain and pt took hands off RW to grasp her leg and required mod assist for balance Gait Pattern: Step-to pattern;Decreased stride length;Right flexed knee in stance;Antalgic;Trunk flexed    Exercises     PT Diagnosis: Difficulty walking;Acute pain  PT Problem List: Decreased activity tolerance;Decreased balance;Decreased mobility;Decreased knowledge of use of DME;Decreased safety awareness;Decreased knowledge of precautions;Pain PT Treatment Interventions: DME instruction;Gait training;Stair training;Functional mobility training;Patient/family education     PT Goals(Current goals can be found in the care plan section) Acute Rehab PT Goals Patient Stated Goal:  to go home today PT Goal Formulation: With patient Time For Goal Achievement: 05/29/13 Potential to Achieve Goals: Good  Visit Information  Last PT Received On:  05/26/13 Assistance Needed: +1 History of Present Illness: Pt is a 79 yo female admitted with back pain so severe that she was unable to bear weight on her feet and walk.  Pt has a h/o lumber back problems including spinal stenosis at several levels.  Neurosurgery has decided to attempt to manage pt with some injections that will start next Wednesday.        Prior Functioning  Home Living Family/patient expects to be discharged to:: Private residence Living Arrangements: Spouse/significant other Available Help at Discharge: Family;Available 24 hours/day;Other (Comment) (daughter and grandaughter) Type of Home: House Home Access: Stairs to enter [employer] of Steps: 2 Entrance Stairs-Rails: None Home Layout: One level Home Equipment: Walker - standard;Bedside commode;Cane - single point Additional Comments: pt would benefit from tub bench Prior Function Level of Independence: Needs assistance Gait / Transfers Assistance Needed: was independent with occ use of cane prior to onset of back/leg pain; since pain started, she has been using std walker and required assist ADL's / Homemaking Assistance Needed: pt does not cook or clean but was able to do most of her adls.  Grand daughter did assist at times with bathing/dressing.  Pt has been instucted to not get in the shower w/o assist. Communication / Swallowing Assistance Needed: no assist needed. Comments: This therapist has concerns about this pt being home alone b/c she is mildly impulsive and stubborn.  Talked at length with daugther about trying to provide 24/7 S until injections are given and then see how this pt is doing with mobility at that time. Communication Communication: HOH Dominant Hand: Right    Cognition  Cognition Arousal/Alertness: Awake/alert Behavior During Therapy: Impulsive Overall Cognitive Status: Within Functional Limits for tasks assessed    Extremity/Trunk Assessment Upper Extremity  Assessment Upper Extremity Assessment: Defer to OT evaluation Lower Extremity Assessment Lower Extremity Assessment: Generalized weakness Cervical / Trunk Assessment Cervical / Trunk Assessment: Kyphotic   Balance Balance Balance Assessed: No  End of Session PT - End of Session Equipment Utilized During Treatment: Gait belt Activity Tolerance: Patient limited by pain Patient left: in chair;with call bell/phone within reach;with family/visitor present Nurse Communication: Mobility status;Patient requests pain meds  GP Functional Assessment Tool Used: clinical judgement Functional Limitation: Mobility: Walking and moving around Mobility: Walking and Moving Around Current Status [phone]): At least 20 percent but less than 40 percent impaired, limited or restricted Mobility: Walking and Moving Around Goal Status [phone]): At least 1 percent but less than 20 percent impaired, limited or restricted   Harlon Kutner 05/26/2013, 2:00 PM Pager [phone]

## 2013-05-26 NOTE — Evaluation (Signed)
Occupational Therapy Evaluation Patient Details Name: Lori [name] MRN: [id number] DOB: Oct 15, 1918 Today's Date: 05/26/2013 Time: 4098-1191 OT Time Calculation (min): 20 min  OT Assessment / Plan / Recommendation History of present illness Pt is a 79 yo female admitted with back pain so severe that she was unable to bear weight on her feet and walk.  Pt has a h/o lumber back problems including spinal stenosis at several levels.  Neurosurgery has decided to attempt to manage pt with some injections that will start next Wednesday.  In the mean time, pt seems to be be better with pain medicine.   Clinical Impression   Pt admitted with significant back pain which has not eased off with pain meds and has the following deficits below.  Pt would benefit from cont OT to increase safety and I with adls so she can safely live alone with her husband.    OT Assessment  Patient needs continued OT Services    Follow Up Recommendations  Home health OT;Supervision/Assistance - 24 hour    Barriers to Discharge Decreased caregiver support need to ensure there is 24/7 S at home.  Equipment Recommendations  Tub/shower bench    Recommendations for Other Services    Frequency  Min 2X/week    Precautions / Restrictions Precautions Precautions: Fall;Back Precaution Comments: Recommend pt follow standard back precautions to protect her spine as much as possible and hopefully avoid surgery. Restrictions Weight Bearing Restrictions: No   Pertinent Vitals/Pain Pt c/o 1/10 pain.    ADL  Eating/Feeding: Simulated;Independent Where Assessed - Eating/Feeding: Chair Grooming: Simulated;Set up Where Assessed - Grooming: Supported standing Upper Body Bathing: Performed;Set up Where Assessed - Upper Body Bathing: Supported sitting Lower Body Bathing: Simulated;Min guard Where Assessed - Lower Body Bathing: Supported sit to stand Upper Body Dressing: Performed;Set up Where Assessed - Upper Body Dressing:  Unsupported sitting Lower Body Dressing: Performed;Minimal assistance Where Assessed - Lower Body Dressing: Supported sit to stand Toilet Transfer: Performed;Minimal assistance Toilet Transfer Method:  (ambulating to bathroom) Acupuncturist: Comfort height toilet;Grab bars Toileting - Clothing Manipulation and Hygiene: Simulated;Min guard Where Assessed - Engineer, mining and Hygiene: Sit to stand from 3-in-1 or toilet Tub/Shower Transfer: Simulated;Minimal assistance;Other (comment) (talked at length about a tub bench for home.) Tub/Shower Transfer Method: Ambulating Tub/Shower Transfer Equipment: Counsellor Used: Rolling walker;Gait belt Transfers/Ambulation Related to ADLs: Pt walked in room and in gym with min assist to min guard at times.  pt became "fuzzy" feeling from medicine which does produce a fall risk for home. ADL Comments: Pt did well maintaining back precautions during adls if she can remember to follow them at home. pt impulsive at times.    OT Diagnosis: Generalized weakness;Acute pain  OT Problem List: Impaired balance (sitting and/or standing);Decreased safety awareness;Decreased knowledge of use of DME or AE;Decreased knowledge of precautions;Pain OT Treatment Interventions: Self-care/ADL training;Therapeutic activities;DME and/or AE instruction   OT Goals(Current goals can be found in the care plan section) Acute Rehab OT Goals Patient Stated Goal: to get home now that the pain is better OT Goal Formulation: With patient/family Time For Goal Achievement: 06/02/13 Potential to Achieve Goals: Good ADL Goals Pt Will Perform Lower Body Dressing: with supervision;sit to/from stand Pt Will Perform Tub/Shower Transfer: with supervision;ambulating;tub bench;rolling walker Additional ADL Goal #1: pt will complete all aspects of toileting with comfort commode and rail.  Visit Information  Last OT Received On: 05/26/13 Assistance  Needed: +1 PT/OT Co-Evaluation/Treatment: Yes History of Present  Illness: Pt is a 79 yo female admitted with back pain so severe that she was unable to bear weight on her feet and walk.  Pt has a h/o lumber back problems including spinal stenosis at several levels.  Neurosurgery has decided to attempt to manage pt with some injections that will start next Wednesday.  In the mean time, pt seems to be be better with pain medicine.       Prior Functioning     Home Living Family/patient expects to be discharged to:: Private residence Living Arrangements: Spouse/significant other Available Help at Discharge: Family;Available 24 hours/day;Other (Comment) (24/7 for a few days is available) Type of Home: House Home Access: Stairs to enter [employer] of Steps: 2 Home Layout: One level Home Equipment: Walker - standard Additional Comments: pt would benefit from tub bench Prior Function Level of Independence: Needs assistance ADL's / Homemaking Assistance Needed: pt does not cook or clean but was able to do most of her adls.  Grand daughter did assist at times with bathing/dressing.  Pt has been instucted to not get in the shower w/o assist. Communication / Swallowing Assistance Needed: no assist needed. Comments: This therapist has concerns about this pt being home alone b/c she is mildly impulsive and stubborn.  Talked at length with daugther about trying to provide 24/7 S until injections are given and then see how this pt is doing with mobility at that time. Communication Communication: HOH Dominant Hand: Right         Vision/Perception Vision - History Baseline Vision: No visual deficits Patient Visual Report: No change from baseline Vision - Assessment Vision Assessment: Vision not tested   Cognition  Cognition Arousal/Alertness: Awake/alert Behavior During Therapy: Impulsive Overall Cognitive Status: Within Functional Limits for tasks assessed    Extremity/Trunk  Assessment Upper Extremity Assessment Upper Extremity Assessment: Overall WFL for tasks assessed Lower Extremity Assessment Lower Extremity Assessment: Defer to PT evaluation Cervical / Trunk Assessment Cervical / Trunk Assessment: Kyphotic     Mobility Transfers Transfers: Sit to Stand;Stand to Sit Sit to Stand: From chair/3-in-1;4: Min assist;With armrests Stand to Sit: 4: Min assist;To chair/3-in-1;With armrests Details for Transfer Assistance: Pt required cues to let herself down slowly to protect her back when sitting.     Exercise     Balance Balance Balance Assessed: No   End of Session OT - End of Session Equipment Utilized During Treatment: Gait belt;Rolling walker Activity Tolerance: Patient tolerated treatment well;Other (comment) (c/o dizziness toward end of treatment.) Patient left: in chair;with call bell/phone within reach;with family/visitor present Nurse Communication: Mobility status  GO Functional Assessment Tool Used: clinical judegment Functional Limitation: Self care Self Care Current Status (N8295): At least 1 percent but less than 20 percent impaired, limited or restricted Self Care Goal Status (A2130): At least 1 percent but less than 20 percent impaired, limited or restricted   Hope Budds 05/26/2013, 1:00 PM [phone]

## 2013-05-26 NOTE — H&P (Signed)
[facility] History and Physical  Lori [name]:[id number] DOB: 10/14/1918 DOA: 05/25/2013  Referring physician: ED PCP: Rudi Heap, MD   Chief Complaint: Back pain  HPI: Lori [name] is a 79 y.o. female who presents with right sided leg and back pain.  Pain primarily in lower R back but radiates to R thigh.  So severe she is unable to ambulate, pain better at rest, worse with weight bearing.  Knows she has severe arthritis in her lower back from previous X-Rays.  In the ED MRI was obtained which shows spinal stenosis at the L4-5 level, severe, and likely the cause of her pain.  Neurosurgery consulted and they will evaluate patient, they have asked hospitalist to admit in the meantime.  Review of Systems: 12 systems reviewed and otherwise negative.  Past Medical History  Diagnosis Date  . Carotid artery stenosis     Left carotid endarterectomy 02/2001  . Hypertension   . Hyperlipidemia   . Thyroid disease     Euthyroid 02/2012  . CAD (coronary artery disease)     a. NSTEMI in 2011 s/p PCI/DES to distal Cx, s/p PCI/DES to OM2 (EF 40%). b. NSTEMI 02/2012 treated conservatively.  . Arthritis   . CHF (congestive heart failure)     a. EF 40% in 2011. b. EF 35-40% by echo 03/12/12.  [name] Kitchen Respiratory failure     02/2012 - requiring bipap - multifactorial in setting of PNA/CHF/NSTEMI  . Pneumonia     02/2012  . Hyponatremia     Noted on 02/2012 admission  . Renal insufficiency     Noted on 02/2012 admission   Past Surgical History  Procedure Laterality Date  . Carotid endarterectomy      left 02/2001  . Cardiac catheterization     Social History:  reports that she has never smoked. She has never used smokeless tobacco. She reports that she does not drink alcohol or use illicit drugs.   No Known Allergies  Family History  Problem Relation Age of Onset  . Prostate cancer    . Heart failure Mother 73  . Coronary artery disease Brother 64    CABG, alive at 83  .  Coronary artery disease Brother 25    Died  . Heart failure Sister   . Heart failure Sister    Prior to Admission medications   Medication Sig Start Date End Date Taking? Authorizing Provider  amLODipine-olmesartan (AZOR) 5-20 MG per tablet Take 1 tablet by mouth daily as needed. 11/23/12  Yes Rollene Rotunda, MD  aspirin 81 MG tablet Take 81 mg by mouth daily.     Yes Historical Provider, MD  carvedilol (COREG) 3.125 MG tablet TAKE ONE TABLET BY MOUTH TWICE A DAY WITH MEALS 01/16/13  Yes Mary-Margaret Daphine Deutscher, FNP  clopidogrel (PLAVIX) 75 MG tablet Take 1 tablet (75 mg total) by mouth daily. 04/07/13  Yes Deatra Canter, FNP  furosemide (LASIX) 40 MG tablet TAKE ONE TABLET BY MOUTH ONE TIME DAILY 01/16/13  Yes Mary-Margaret Daphine Deutscher, FNP  HYDROcodone-acetaminophen (NORCO/VICODIN) 5-325 MG per tablet Take 1 tablet by mouth every 6 (six) hours as needed for pain. 05/18/13  Yes Hope Orlene Och, NP  isosorbide mononitrate (IMDUR) 30 MG 24 hr tablet TAKE ONE HALF TABLET BY MOUTH EVERY DAY 05/15/13  Yes Beatrice Lecher, PA-C  levothyroxine (SYNTHROID, LEVOTHROID) 50 MCG tablet TAKE ONE TABLET BY MOUTH ONE TIME DAILY 01/16/13  Yes Mary-Margaret Daphine Deutscher, FNP  pravastatin (PRAVACHOL) 40 MG tablet Take 1  tablet (40 mg total) by mouth daily. 05/15/13  Yes Deatra Canter, FNP  nitroGLYCERIN (NITROSTAT) 0.4 MG SL tablet Place 1 tablet (0.4 mg total) under the tongue every 5 (five) minutes x 3 doses as needed for chest pain. 03/15/12 03/15/13  Laurann Montana, PA-C   Physical Exam: Filed Vitals:   05/26/13 0007  BP: 175/56  Pulse: 82  Temp: 97.6 F (36.4 C)  Resp:     General:  NAD, resting comfortably in bed Eyes: PEERLA EOMI ENT: mucous membranes moist Neck: supple w/o JVD Cardiovascular: RRR w/o MRG Respiratory: CTA B Abdomen: soft, nt, nd, bs+ Skin: no rash nor lesion Musculoskeletal: MAE, pain with rotation of the hip Psychiatric: normal tone and affect Neurologic: AAOx3, grossly non-focal  Labs on  Admission:  Basic Metabolic Panel:  Recent Labs Lab 05/25/13 1615  NA 137  K 4.7  CL 99  CO2 28  GLUCOSE 98  BUN 31*  CREATININE 1.22*  CALCIUM 9.4   Liver Function Tests: No results found for this basename: AST, ALT, ALKPHOS, BILITOT, PROT, ALBUMIN,  in the last 168 hours No results found for this basename: LIPASE, AMYLASE,  in the last 168 hours No results found for this basename: AMMONIA,  in the last 168 hours CBC:  Recent Labs Lab 05/25/13 1615  WBC 9.9  NEUTROABS 5.5  HGB 12.5  HCT 37.2  MCV 87.7  PLT 215   Cardiac Enzymes: No results found for this basename: CKTOTAL, CKMB, CKMBINDEX, TROPONINI,  in the last 168 hours  BNP (last 3 results) No results found for this basename: PROBNP,  in the last 8760 hours CBG: No results found for this basename: GLUCAP,  in the last 168 hours  Radiological Exams on Admission: Dg Lumbar Spine Complete  05/25/2013   *RADIOLOGY REPORT*  Clinical Data: Leg pain and back pain  LUMBAR SPINE - COMPLETE 4+ VIEW  Comparison: None.  Findings:  Numbering of the lumbar spine assumes diminutive ribs at T12.  The significant bony demineralization.  There is grade 1 anterolisthesis of L4 on L5, likely due to marked facet joint degenerative changes in the lower lumbar spine. There are no pars defects in the lumbar spine.  There is some disc space narrowing at all levels, but most prominent at L4-L5.  No acute fracture. Extensive atherosclerotic calcification of the aortoiliac vasculature. There is marked sclerosis about the inferior aspect of the left sacroiliac joint.  IMPRESSION:  1.  Marked osseous demineralization. 2.  No acute fracture identified. 3. Grade 1 anterolisthesis of L4 on L5 is likely secondary to marked lower lumbar spine facet joint degenerative change. 4.  Prominent sclerosis of the left sacroiliac joint suggesting sacroiliitis or prominent degenerative change.   Original Report Authenticated By: Britta Mccreedy, M.D.   Dg Hip  Complete Right  05/25/2013   *RADIOLOGY REPORT*  Clinical Data: Leg pain and back pain.  No known injury.  RIGHT HIP - COMPLETE 2+ VIEW  Comparison: Lumbar spine radiographs 05/25/2013  Findings: The bones are osteopenic.  No acute fracture is identified.  The right hip is located.  Dense sclerosis about the inferior left sacroiliac joint is noted. No significant degenerative changes of the hips for patient age. Peripheral vascular atherosclerotic calcification of the femoral arteries bilaterally.  Visualized bowel gas pattern nonobstructive.  IMPRESSION: 1.  Decreased bony mineralization. No acute bony abnormality identified.  2.  Marked sclerosis of the left sacroiliac joint inferiorly.  This could reflect inflammatory sacroiliitis or significant degenerative changes.   Original Report Authenticated By: Britta Mccreedy, M.D.   Mr Lumbar Spine Wo Contrast  05/25/2013   *RADIOLOGY REPORT*  Clinical Data: Lumbar radiculopathy.  MRI LUMBAR SPINE WITHOUT CONTRAST  Technique:  Multiplanar and multiecho pulse sequences of the lumbar spine were obtained without intravenous contrast.  Comparison: Radiography from the same day.  Findings:  No marrow signal abnormality suggestive of fracture, infection, neoplasm.  Sclerotic and edematous changes surrounding the L4-5 interspace, worst on the left, are considered degenerative.   No endplate erosion.  Normal conus signal and morphology.  Multilevel degenerative disc and facet disease:  L1-2:  Grade 1 anterolisthesis with disc uncovering/bulge combining with dorsal ligamentous/facet overgrowth to cause mild canal stenosis.  Disc effaces the inferior foramina, without neural impingement.  L2-3:  Limited degeneration for age.  L3-4:  Disc bulging combines with dorsal ligamentous and facet overgrowth to narrow the lateral recesses, left more than right. No significant foraminal compromise.  L4-5:  Grade 1, borderline grade 2, anterolisthesis related to  advanced facet osteoarthritis.  The slip combined with the dorsal disc bulging, ligamentous/facet overgrowth, and 4 x 2 mm right- sided ligamentous or synovial cyst causes severe spinal canal stenosis.  Advanced left foraminal compromise from the same.  L5-S1:  Broad-based disc bulging crowds the lateral recesses bilaterally when combined with dorsal ligamentous overgrowth. Facet degeneration with left effusion.  Incidental findings include extensive colonic diverticulosis. Endometrial stripe/canal 3 mm in thickness.  IMPRESSION: 1.  Severe degenerative spinal canal stenosis at L4-5. The stenosis is multifactorial, including anterolisthesis and right-sided synovial or ligamentous cyst.  2.  Lateral recess stenosis at L3-4 and L5-S1 from a combination of disc and facet degeneration.   Original Report Authenticated By: Tiburcio Pea    EKG: Independently reviewed.  Assessment/Plan Principal Problem:   Spinal stenosis of lumbar region   1. Spinal stenosis of the lumbar region - temporary pain control with norco, neurosurgery plans to evaluate for epidural injection vs surgery.  Admitting for observation.   Code Status: Full Code (must indicate code status--if unknown or must be presumed, indicate so) Family Communication: No family in room (indicate person spoken with, if applicable, with phone number if by telephone) Disposition Plan: Admit to obs (indicate anticipated LOS)  Time spent: 50 min  Yusuf Yu M. [facility] Pager [phone]  If 7PM-7AM, please contact night-coverage [email or web address] Password [email or web address] 05/26/2013, 12:22 AM

## 2013-05-26 NOTE — Progress Notes (Signed)
I will follow up with patient in office and monitor her progress with injections.  Due to the severity of her stenosis and spondylolisthesis (Grade II) of L 45, she may require surgery if she does not improve with conservative measures, but she is reluctant to consider surgery, given her advanced age.

## 2013-05-27 DIAGNOSIS — IMO0002 Reserved for concepts with insufficient information to code with codable children: Secondary | ICD-10-CM | POA: Diagnosis not present

## 2013-05-27 DIAGNOSIS — I10 Essential (primary) hypertension: Secondary | ICD-10-CM | POA: Diagnosis not present

## 2013-05-27 DIAGNOSIS — IMO0001 Reserved for inherently not codable concepts without codable children: Secondary | ICD-10-CM | POA: Diagnosis not present

## 2013-05-27 DIAGNOSIS — I509 Heart failure, unspecified: Secondary | ICD-10-CM | POA: Diagnosis not present

## 2013-05-27 DIAGNOSIS — I2510 Atherosclerotic heart disease of native coronary artery without angina pectoris: Secondary | ICD-10-CM | POA: Diagnosis not present

## 2013-05-27 DIAGNOSIS — M545 Low back pain: Secondary | ICD-10-CM | POA: Diagnosis not present

## 2013-05-27 DIAGNOSIS — R262 Difficulty in walking, not elsewhere classified: Secondary | ICD-10-CM | POA: Diagnosis not present

## 2013-05-27 DIAGNOSIS — M48061 Spinal stenosis, lumbar region without neurogenic claudication: Secondary | ICD-10-CM | POA: Diagnosis not present

## 2013-05-29 ENCOUNTER — Telehealth: Payer: Self-pay | Admitting: Family Medicine

## 2013-05-31 DIAGNOSIS — IMO0001 Reserved for inherently not codable concepts without codable children: Secondary | ICD-10-CM | POA: Diagnosis not present

## 2013-05-31 DIAGNOSIS — IMO0002 Reserved for concepts with insufficient information to code with codable children: Secondary | ICD-10-CM | POA: Diagnosis not present

## 2013-05-31 DIAGNOSIS — M48061 Spinal stenosis, lumbar region without neurogenic claudication: Secondary | ICD-10-CM | POA: Diagnosis not present

## 2013-05-31 DIAGNOSIS — I10 Essential (primary) hypertension: Secondary | ICD-10-CM | POA: Diagnosis not present

## 2013-05-31 DIAGNOSIS — M545 Low back pain: Secondary | ICD-10-CM | POA: Diagnosis not present

## 2013-05-31 DIAGNOSIS — R262 Difficulty in walking, not elsewhere classified: Secondary | ICD-10-CM | POA: Diagnosis not present

## 2013-06-01 DIAGNOSIS — M545 Low back pain, unspecified: Secondary | ICD-10-CM | POA: Diagnosis not present

## 2013-06-01 DIAGNOSIS — M479 Spondylosis, unspecified: Secondary | ICD-10-CM | POA: Diagnosis not present

## 2013-06-01 DIAGNOSIS — M4800 Spinal stenosis, site unspecified: Secondary | ICD-10-CM | POA: Diagnosis not present

## 2013-06-01 DIAGNOSIS — Q762 Congenital spondylolisthesis: Secondary | ICD-10-CM | POA: Diagnosis not present

## 2013-06-01 NOTE — Telephone Encounter (Signed)
expained to daughter that pt would have to be seen again, so we could have a face to face to qualify for walker

## 2013-06-02 DIAGNOSIS — R262 Difficulty in walking, not elsewhere classified: Secondary | ICD-10-CM | POA: Diagnosis not present

## 2013-06-02 DIAGNOSIS — M545 Low back pain: Secondary | ICD-10-CM | POA: Diagnosis not present

## 2013-06-02 DIAGNOSIS — IMO0001 Reserved for inherently not codable concepts without codable children: Secondary | ICD-10-CM | POA: Diagnosis not present

## 2013-06-02 DIAGNOSIS — I10 Essential (primary) hypertension: Secondary | ICD-10-CM | POA: Diagnosis not present

## 2013-06-02 DIAGNOSIS — IMO0002 Reserved for concepts with insufficient information to code with codable children: Secondary | ICD-10-CM | POA: Diagnosis not present

## 2013-06-02 DIAGNOSIS — M48061 Spinal stenosis, lumbar region without neurogenic claudication: Secondary | ICD-10-CM | POA: Diagnosis not present

## 2013-06-06 DIAGNOSIS — M48061 Spinal stenosis, lumbar region without neurogenic claudication: Secondary | ICD-10-CM | POA: Diagnosis not present

## 2013-06-06 DIAGNOSIS — R262 Difficulty in walking, not elsewhere classified: Secondary | ICD-10-CM | POA: Diagnosis not present

## 2013-06-06 DIAGNOSIS — M545 Low back pain: Secondary | ICD-10-CM | POA: Diagnosis not present

## 2013-06-06 DIAGNOSIS — IMO0002 Reserved for concepts with insufficient information to code with codable children: Secondary | ICD-10-CM | POA: Diagnosis not present

## 2013-06-06 DIAGNOSIS — I10 Essential (primary) hypertension: Secondary | ICD-10-CM | POA: Diagnosis not present

## 2013-06-06 DIAGNOSIS — IMO0001 Reserved for inherently not codable concepts without codable children: Secondary | ICD-10-CM | POA: Diagnosis not present

## 2013-06-20 ENCOUNTER — Other Ambulatory Visit: Payer: Self-pay

## 2013-06-20 DIAGNOSIS — IMO0002 Reserved for concepts with insufficient information to code with codable children: Secondary | ICD-10-CM | POA: Diagnosis not present

## 2013-06-20 DIAGNOSIS — M545 Low back pain: Secondary | ICD-10-CM | POA: Diagnosis not present

## 2013-06-20 DIAGNOSIS — IMO0001 Reserved for inherently not codable concepts without codable children: Secondary | ICD-10-CM

## 2013-06-20 DIAGNOSIS — M48061 Spinal stenosis, lumbar region without neurogenic claudication: Secondary | ICD-10-CM

## 2013-06-20 NOTE — Telephone Encounter (Signed)
Last thyroid level 06/30/12  B [place]

## 2013-06-21 DIAGNOSIS — Q762 Congenital spondylolisthesis: Secondary | ICD-10-CM | POA: Diagnosis not present

## 2013-06-21 DIAGNOSIS — M4800 Spinal stenosis, site unspecified: Secondary | ICD-10-CM | POA: Diagnosis not present

## 2013-06-21 MED ORDER — LEVOTHYROXINE SODIUM 50 MCG PO TABS: 50.0000 ug | ORAL_TABLET | Freq: Every day | ORAL | Status: DC

## 2013-07-27 ENCOUNTER — Other Ambulatory Visit: Payer: Self-pay

## 2013-08-04 ENCOUNTER — Emergency Department (HOSPITAL_COMMUNITY): Payer: Medicare Other

## 2013-08-04 ENCOUNTER — Inpatient Hospital Stay (HOSPITAL_COMMUNITY)
Admission: EM | Admit: 2013-08-04 | Discharge: 2013-08-11 | DRG: 200 | Disposition: A | Discharge status: Home Health | Payer: Medicare Other | Attending: General Surgery | Admitting: General Surgery

## 2013-08-04 ENCOUNTER — Encounter (HOSPITAL_COMMUNITY): Payer: Self-pay | Admitting: Emergency Medicine

## 2013-08-04 DIAGNOSIS — I252 Old myocardial infarction: Secondary | ICD-10-CM

## 2013-08-04 DIAGNOSIS — J9383 Other pneumothorax: Principal | ICD-10-CM | POA: Diagnosis present

## 2013-08-04 DIAGNOSIS — S2239XA Fracture of one rib, unspecified side, initial encounter for closed fracture: Secondary | ICD-10-CM | POA: Diagnosis present

## 2013-08-04 DIAGNOSIS — Z7982 Long term (current) use of aspirin: Secondary | ICD-10-CM

## 2013-08-04 DIAGNOSIS — M129 Arthropathy, unspecified: Secondary | ICD-10-CM | POA: Diagnosis present

## 2013-08-04 DIAGNOSIS — R112 Nausea with vomiting, unspecified: Secondary | ICD-10-CM | POA: Diagnosis not present

## 2013-08-04 DIAGNOSIS — I509 Heart failure, unspecified: Secondary | ICD-10-CM | POA: Diagnosis present

## 2013-08-04 DIAGNOSIS — K5900 Constipation, unspecified: Secondary | ICD-10-CM | POA: Diagnosis present

## 2013-08-04 DIAGNOSIS — I2510 Atherosclerotic heart disease of native coronary artery without angina pectoris: Secondary | ICD-10-CM | POA: Diagnosis present

## 2013-08-04 DIAGNOSIS — R141 Gas pain: Secondary | ICD-10-CM | POA: Diagnosis not present

## 2013-08-04 DIAGNOSIS — K560 Paralytic ileus: Secondary | ICD-10-CM | POA: Diagnosis not present

## 2013-08-04 DIAGNOSIS — IMO0002 Reserved for concepts with insufficient information to code with codable children: Secondary | ICD-10-CM | POA: Diagnosis not present

## 2013-08-04 DIAGNOSIS — I10 Essential (primary) hypertension: Secondary | ICD-10-CM | POA: Diagnosis present

## 2013-08-04 DIAGNOSIS — J939 Pneumothorax, unspecified: Secondary | ICD-10-CM

## 2013-08-04 DIAGNOSIS — Z9861 Coronary angioplasty status: Secondary | ICD-10-CM

## 2013-08-04 DIAGNOSIS — K6389 Other specified diseases of intestine: Secondary | ICD-10-CM | POA: Diagnosis not present

## 2013-08-04 DIAGNOSIS — K567 Ileus, unspecified: Secondary | ICD-10-CM | POA: Diagnosis not present

## 2013-08-04 DIAGNOSIS — R109 Unspecified abdominal pain: Secondary | ICD-10-CM | POA: Diagnosis not present

## 2013-08-04 DIAGNOSIS — S0003XA Contusion of scalp, initial encounter: Secondary | ICD-10-CM | POA: Diagnosis not present

## 2013-08-04 DIAGNOSIS — W19XXXA Unspecified fall, initial encounter: Secondary | ICD-10-CM

## 2013-08-04 DIAGNOSIS — E785 Hyperlipidemia, unspecified: Secondary | ICD-10-CM | POA: Diagnosis present

## 2013-08-04 DIAGNOSIS — S3981XA Other specified injuries of abdomen, initial encounter: Secondary | ICD-10-CM | POA: Diagnosis not present

## 2013-08-04 DIAGNOSIS — S0001XA Abrasion of scalp, initial encounter: Secondary | ICD-10-CM

## 2013-08-04 DIAGNOSIS — S0083XA Contusion of other part of head, initial encounter: Secondary | ICD-10-CM | POA: Diagnosis not present

## 2013-08-04 DIAGNOSIS — W108XXA Fall (on) (from) other stairs and steps, initial encounter: Secondary | ICD-10-CM | POA: Diagnosis present

## 2013-08-04 DIAGNOSIS — S270XXA Traumatic pneumothorax, initial encounter: Secondary | ICD-10-CM

## 2013-08-04 DIAGNOSIS — R0789 Other chest pain: Secondary | ICD-10-CM | POA: Diagnosis not present

## 2013-08-04 DIAGNOSIS — S2231XA Fracture of one rib, right side, initial encounter for closed fracture: Secondary | ICD-10-CM

## 2013-08-04 DIAGNOSIS — S1093XA Contusion of unspecified part of neck, initial encounter: Secondary | ICD-10-CM | POA: Diagnosis not present

## 2013-08-04 LAB — CBC WITH DIFFERENTIAL/PLATELET
Basophils Absolute: 0 10*3/uL (ref 0.0–0.1)
Basophils Relative: 0 % (ref 0–1)
Hemoglobin: 11.8 g/dL — ABNORMAL LOW (ref 12.0–15.0)
MCHC: 33.1 g/dL (ref 30.0–36.0)
Neutro Abs: 7.8 10*3/uL — ABNORMAL HIGH (ref 1.7–7.7)
Neutrophils Relative %: 65 % (ref 43–77)
RDW: 13.6 % (ref 11.5–15.5)

## 2013-08-04 LAB — COMPREHENSIVE METABOLIC PANEL
AST: 26 U/L (ref 0–37)
Albumin: 3.5 g/dL (ref 3.5–5.2)
Alkaline Phosphatase: 69 U/L (ref 39–117)
Chloride: 97 mEq/L (ref 96–112)
Potassium: 4.6 mEq/L (ref 3.5–5.1)
Total Bilirubin: 0.2 mg/dL — ABNORMAL LOW (ref 0.3–1.2)

## 2013-08-04 MED ORDER — OXYCODONE HCL 5 MG PO TABS
5.0000 mg | ORAL_TABLET | ORAL | Status: DC | PRN
  Administered 2013-08-05: 5 mg via ORAL
  Filled 2013-08-04: qty 1

## 2013-08-04 MED ORDER — SODIUM CHLORIDE 0.9 % IV BOLUS (SEPSIS)
500.0000 mL | Freq: Once | INTRAVENOUS | Status: AC
  Administered 2013-08-04: 500 mL via INTRAVENOUS

## 2013-08-04 MED ORDER — KCL IN DEXTROSE-NACL 20-5-0.45 MEQ/L-%-% IV SOLN
INTRAVENOUS | Status: DC
  Administered 2013-08-05 – 2013-08-07 (×2): 20 mL/h via INTRAVENOUS
  Filled 2013-08-04 (×8): qty 1000

## 2013-08-04 MED ORDER — HYDROMORPHONE HCL PF 1 MG/ML IJ SOLN
1.0000 mg | INTRAMUSCULAR | Status: AC
  Administered 2013-08-04: 1 mg via INTRAVENOUS
  Filled 2013-08-04: qty 1

## 2013-08-04 MED ORDER — HYDROMORPHONE HCL PF 1 MG/ML IJ SOLN: 0.5000 mg | INTRAMUSCULAR | Status: DC | PRN

## 2013-08-04 MED ORDER — HYDROMORPHONE HCL PF 1 MG/ML IJ SOLN
0.5000 mg | Freq: Once | INTRAMUSCULAR | Status: AC
  Administered 2013-08-04: 0.5 mg via INTRAVENOUS
  Filled 2013-08-04: qty 1

## 2013-08-04 MED ORDER — IOHEXOL 300 MG/ML  SOLN
65.0000 mL | Freq: Once | INTRAMUSCULAR | Status: AC | PRN
  Administered 2013-08-04: 65 mL via INTRAVENOUS

## 2013-08-04 MED ORDER — NITROGLYCERIN 0.4 MG SL SUBL: 0.4000 mg | SUBLINGUAL_TABLET | SUBLINGUAL | Status: DC | PRN

## 2013-08-04 MED ORDER — TRAMADOL HCL 50 MG PO TABS
50.0000 mg | ORAL_TABLET | Freq: Four times a day (QID) | ORAL | Status: DC | PRN
  Administered 2013-08-06 – 2013-08-07 (×2): 50 mg via ORAL
  Filled 2013-08-04 (×2): qty 1

## 2013-08-04 MED ORDER — OXYCODONE HCL 5 MG PO TABS
10.0000 mg | ORAL_TABLET | ORAL | Status: DC | PRN
  Administered 2013-08-04 – 2013-08-06 (×3): 10 mg via ORAL
  Filled 2013-08-04 (×3): qty 2

## 2013-08-04 MED ORDER — ONDANSETRON HCL 4 MG PO TABS: 4.0000 mg | ORAL_TABLET | Freq: Four times a day (QID) | ORAL | Status: DC | PRN

## 2013-08-04 MED ORDER — ONDANSETRON HCL 4 MG/2ML IJ SOLN
4.0000 mg | Freq: Four times a day (QID) | INTRAMUSCULAR | Status: DC | PRN
  Administered 2013-08-07 – 2013-08-08 (×2): 4 mg via INTRAVENOUS
  Filled 2013-08-04 (×2): qty 2

## 2013-08-04 NOTE — ED Notes (Signed)
Please call Arline Asp, daughter [phone]) when pt's room is assigned

## 2013-08-04 NOTE — ED Notes (Signed)
Patient transported to CT 

## 2013-08-04 NOTE — ED Provider Notes (Signed)
I saw and evaluated the patient, reviewed the resident's note and I agree with the findings and plan.   .Face to face Exam:  General:  Awake HEENT:  Atraumatic Resp:  Normal effort Abd:  Nondistended Neuro:No focal weakness   Nelia Shi, MD 08/04/13 2317

## 2013-08-04 NOTE — ED Notes (Signed)
Dr. Janee Morn, trauma MD at bedside.

## 2013-08-04 NOTE — ED Notes (Signed)
Answered call light, pt. Complaining of monitor alarming. Explained to pt. Why the monitor was alarming.

## 2013-08-04 NOTE — ED Provider Notes (Signed)
CSN: [id number]     Arrival date & time 08/04/13  1645 History   First MD Initiated Contact with Patient 08/04/13 1646     Chief Complaint  Patient presents with  . Fall   (Consider location/radiation/quality/duration/timing/severity/associated sxs/prior Treatment) The history is provided by the patient. No language interpreter was used.   Patient is a 79 year old Caucasian female comes emergency department today after a mechanical fall. She was walking up stairs which reached up to grab rales. She missed the rail and fell over the side of the stairs.  She impacted the left side of her head and her right chest.  Since then she's been suffering pain mainly to the right side of her chest. She denies neck pain, back pain, shortness of breath. She denied any dizziness or weakness prior to the fall. She did not lose consciousness. She is not amnestic to the event. She refused to be placed in a c-collar when EMS arrived. She continues to refuse at this time. She has no weakness or numbness. He does complain of headache but denies nausea, vomiting or lethargy.  She rates the pain in her chest at 9/10.  Past Medical History  Diagnosis Date  . Carotid artery stenosis     Left carotid endarterectomy 02/2001  . Hypertension   . Hyperlipidemia   . Thyroid disease     Euthyroid 02/2012  . CAD (coronary artery disease)     a. NSTEMI in 2011 s/p PCI/DES to distal Cx, s/p PCI/DES to OM2 (EF 40%). b. NSTEMI 02/2012 treated conservatively.  . Arthritis   . CHF (congestive heart failure)     a. EF 40% in 2011. b. EF 35-40% by echo 03/12/12.  [name] Kitchen Respiratory failure     02/2012 - requiring bipap - multifactorial in setting of PNA/CHF/NSTEMI  . Pneumonia     02/2012  . Hyponatremia     Noted on 02/2012 admission  . Renal insufficiency     Noted on 02/2012 admission   Past Surgical History  Procedure Laterality Date  . Carotid endarterectomy      left 02/2001  . Cardiac catheterization     Family History   Problem Relation Age of Onset  . Prostate cancer    . Heart failure Mother 63  . Coronary artery disease Brother 29    CABG, alive at 102  . Coronary artery disease Brother 27    Died  . Heart failure Sister   . Heart failure Sister    History  Substance Use Topics  . Smoking status: Never Smoker   . Smokeless tobacco: Never Used  . Alcohol Use: No   OB History   Grav Para Term Preterm Abortions TAB SAB Ect Mult Living                 Review of Systems  Constitutional: Negative for fever and chills.  Respiratory: Negative for cough and shortness of breath.   Cardiovascular: Negative for chest pain.  Gastrointestinal: Positive for abdominal pain. Negative for nausea, vomiting, diarrhea and constipation.  Genitourinary: Negative for dysuria, urgency and frequency.  Musculoskeletal: Negative for back pain and neck pain.  Neurological: Positive for headaches. Negative for dizziness, weakness and numbness.  Psychiatric/Behavioral: Negative for confusion.  All other systems reviewed and are negative.    Allergies  Review of patient's allergies indicates no known allergies.  Home Medications   Current Outpatient Rx  Name  Route  Sig  Dispense  Refill  . amLODipine-olmesartan (AZOR) 5-20 MG  per tablet   Oral   Take 1 tablet by mouth daily as needed.   30 tablet   11   . aspirin 81 MG tablet   Oral   Take 81 mg by mouth daily.           . carvedilol (COREG) 3.125 MG tablet      TAKE ONE TABLET BY MOUTH TWICE A DAY WITH MEALS   60 tablet   0   . clopidogrel (PLAVIX) 75 MG tablet   Oral   Take 1 tablet (75 mg total) by mouth daily.   30 tablet   5   . furosemide (LASIX) 40 MG tablet      TAKE ONE TABLET BY MOUTH ONE TIME DAILY   30 tablet   0   . isosorbide mononitrate (IMDUR) 30 MG 24 hr tablet      TAKE ONE HALF TABLET BY MOUTH EVERY DAY   30 tablet   10   . levothyroxine (SYNTHROID, LEVOTHROID) 50 MCG tablet   Oral   Take 1 tablet (50 mcg total)  by mouth daily before breakfast.   30 tablet   1   . pravastatin (PRAVACHOL) 40 MG tablet   Oral   Take 1 tablet (40 mg total) by mouth daily.   30 tablet   11   . EXPIRED: nitroGLYCERIN (NITROSTAT) 0.4 MG SL tablet   Sublingual   Place 1 tablet (0.4 mg total) under the tongue every 5 (five) minutes x 3 doses as needed for chest pain.   25 tablet   4    BP 130/44  Pulse 72  Temp(Src) 97.8 F (36.6 C) (Oral)  Resp 15  SpO2 99% Physical Exam  Nursing note and vitals reviewed. Constitutional: She is oriented to person, place, and time. She appears well-developed and well-nourished. No distress.  HENT:  Head: Normocephalic. Head is with abrasion (left temporal region) and with contusion (left temporal region). Head is without raccoon's eyes, without Battle's sign and without laceration.  Right Ear: Tympanic membrane, external ear and ear canal normal.  Left Ear: Tympanic membrane, external ear and ear canal normal.  Nose: No sinus tenderness, nasal deformity, septal deviation or nasal septal hematoma.  Eyes: Pupils are equal, round, and reactive to light. Pupils are equal.  Neck: Normal range of motion. No spinous process tenderness and no muscular tenderness present.  Cardiovascular: Normal rate, regular rhythm, normal heart sounds and intact distal pulses.   Pulmonary/Chest: Effort normal. No respiratory distress. She has no decreased breath sounds. She has no wheezes. She has no rhonchi. She has no rales. She exhibits tenderness and bony tenderness (right side).  Abdominal: Soft. Bowel sounds are normal. She exhibits no distension. There is tenderness in the right upper quadrant. There is no rigidity, no rebound, no guarding, no tenderness at McBurney's point and negative Murphy's sign.  Musculoskeletal:       Cervical back: She exhibits no tenderness, no bony tenderness, no edema, no deformity, no laceration, no pain and no spasm.       Thoracic back: She exhibits no tenderness,  no bony tenderness, no deformity, no laceration, no pain and no spasm.       Lumbar back: She exhibits no tenderness, no bony tenderness, no deformity, no laceration, no pain and no spasm.  Neurological: She is alert and oriented to person, place, and time. She has normal strength. No cranial nerve deficit or sensory deficit. She exhibits normal muscle tone. Coordination and gait normal.  Skin: Skin is warm and dry.    ED Course  Procedures (including critical care time) Labs Review Labs Reviewed  CBC WITH DIFFERENTIAL - Abnormal; Notable for the following:    WBC 12.1 (*)    Hemoglobin 11.8 (*)    HCT 35.6 (*)    Neutro Abs 7.8 (*)    All other components within normal limits  COMPREHENSIVE METABOLIC PANEL - Abnormal; Notable for the following:    Glucose, Bld 110 (*)    BUN 28 (*)    Creatinine, Ser 1.15 (*)    Total Bilirubin 0.2 (*)    GFR calc non Af Amer 40 (*)    GFR calc Af Amer 46 (*)    All other components within normal limits  CG4 I-STAT (LACTIC ACID)   Imaging Review Ct Head Wo Contrast  08/04/2013   CLINICAL DATA:  Post fall from porch, now with bruising is to the right side of the forehead  EXAM: CT HEAD WITHOUT CONTRAST  CT CERVICAL SPINE WITHOUT CONTRAST  TECHNIQUE: Multidetector CT imaging of the head and cervical spine was performed following the standard protocol without intravenous contrast. Multiplanar CT image reconstructions of the cervical spine were also generated.  COMPARISON:  Head CT -12/29/20008  FINDINGS: CT HEAD FINDINGS  There is a minimal amount of soft tissue stranding about the left temporal calvarium (images 14 and 22, series 2). This finding is without associated displaced calvarial fracture or radiopaque foreign body. No acute intraparenchymal or extra-axial hemorrhage. Mild, likely age-appropriate, atrophy with mild diffuse sulcal prominence. Minimal calcifications within the bilateral basal ganglia. Gray-white differentiation is maintained without  CT evidence of acute large territory infarct. No intraparenchymal or extra-axial mass. Unchanged size and configuration of the ventricles and basilar cisterns. Limited visualization of the paranasal sinuses and mastoid air cells are normal. Post bilateral cataract surgery. Incidental note is made of a torus palatini.  CT CERVICAL SPINE FINDINGS  C1 into the superior endplate of T4 is imaged.  Mild accentuation of the cervical lordosis. There is very minimal (approximately 2 mm) of anterolisthesis of C6 upon C7. The bilateral facets are normally aligned. There is ankylosis of the left-sided C2 - C3 and C3-C4 transverse process. The dens is normally positioned between the lateral masses of C1. Moderate to severe degenerative change of the atlantodental articulation. Normal atlantoaxial articulations.  No fracture or static subluxation of the cervical spine. Cervical vertebral body heights are preserved. Prevertebral soft tissues are normal.  There is moderate to severe multilevel cervical spine DDD, worst at C5-C6 and to a lesser extent, C4-C5 and C6-C7 with disk space height loss, endplate irregularity and sclerosis.  Calcifications within the bilateral carotid bulbs, right greater than left. Calcifications within the left vertebral artery note is made of an approximately 1.8 x 1.7 cm hypo attenuating partially exophytic nodule arising from the right lobe of the thyroid (image 82, series 5). A surgical clip is seen anterior to the left SCM musculature. Calcifications within the image the aortic arch.  Limited visualization of the lung apices demonstrates grossly symmetric biapical pleural parenchymal thickening.  IMPRESSION: 1. Mild soft tissue stranding about the left temporal calvarium without associated displaced calvarial fracture, radiopaque foreign body or acute intracranial process. 2. No fracture or static subluxation of the cervical spine 3. Moderate to severe multilevel cervical spine DDD, likely worse at  C5-C6. 4. Indeterminate approximately 1.8 cm partially exophytic hypo attenuating nodule arising from the right lobe of the thyroid. Further evaluation with nonemergent dedicated  thyroid ultrasound may be obtained as clinically indicated.   Electronically Signed   By: Simonne Come M.D.   On: 08/04/2013 19:38   Ct Chest W Contrast  08/04/2013   CLINICAL DATA:  Post fall from porch, now with right-sided rib pain  EXAM: CT CHEST, ABDOMEN, AND PELVIS WITH CONTRAST  TECHNIQUE: Multidetector CT imaging of the chest, abdomen and pelvis was performed following the standard protocol during bolus administration of intravenous contrast.  CONTRAST:  65mL OMNIPAQUE IOHEXOL 300 MG/ML  SOLN  COMPARISON:  None.  FINDINGS: CT CHEST FINDINGS  There is a minimally displaced fracture involving the anterior lateral aspect of the right 7th rib (best seen on coronal image 44) with associated trace right-sided pneumothorax (images 18 and 21 and 35, series 4). No pleural effusion.  There is minimal subsegmental atelectasis within the bilateral lower lobes. Grossly symmetric biapical pleural parenchymal thickening. No focal airspace opacities. No discrete pulmonary nodules. The central pulmonary airways are widely patent. Scattered shotty mediastinal lymph nodes are individually not enlarged by size criteria. No mediastinal, hilar or axillary lymphadenopathy.  Normal heart size. Extensive coronary artery calcifications. No pericardial effusion.  Scattered atherosclerotic plaque within a normal caliber thoracic aorta. No definite thoracic aortic dissection on this non CT examination. Extensive atherosclerotic plaque involves of the origin and proximal aspects of the major great vessels of the arch, in particular, the left subclavian artery, possibly resulting in hemodynamically significant narrowing. .  Note is made of an approximately 1.6 x 1.2 cm hypoattenuating nodule within the right lobe of the thyroid (image 11, series 3).  CT  ABDOMEN AND PELVIS FINDINGS  Normal hepatic contour. No discrete hepatic lesions. Normal appearance of the gallbladder. No intra or extrahepatic biliary ductal dilatation. No ascites.  There is symmetric enhancement and excretion of the bilateral kidneys. There is very minimal asymmetric atrophy of the left kidney in relation to the right. No definite renal stones on this postcontrast examination. No discrete renal lesions. Normal appearance of the bilateral adrenal glands, pancreas and spleen.  Extensive colonic diverticulosis without evidence of diverticulitis. It would bowel is otherwise normal in course and caliber without wall thickening or evidence of obstruction. Normal appearance of the appendix. No pneumoperitoneum, pneumatosis or portal venous gas.  There is rather extensive mixed calcified and noncalcified atherosclerotic plaque within the abdominal aorta. There is mild ectasia of the infrarenal abdominal aorta measuring approximately 1.9 cm in greatest short axis oblique axial dimension (image 73, series 3). There are is extensive atherosclerotic plaque involving the origin of the major branch vessels of the abdominal aorta, however these vessels remain patent on this non CTA examination. No retroperitoneal, mesenteric, pelvic or inguinal lymphadenopathy.  Normal diminutive appearance of the pelvic organs for age. No discrete adnexal lesion. No free fluid within the pelvis.  Asymmetric degenerate changes of the left SI joint. There is a serpiginous lucency through the posterior aspect of the left ilium which extends to the left SI joint (axial image 93, series 3) which may represent an age-indeterminate though presumably chronic nondisplaced fracture. There is approximately 9 mm of anterolisthesis of L4 upon L5 with associated severe DDD at this level.  IMPRESSION: 1. Minimally displaced fracture involving the anterior lateral aspect of the right 7th ribs with associated trace right-sided pneumothorax.  No pleural effusion. 2. Asymmetric degenerative change of the left SI joint with associated serpiginous lucency involving the posterior aspect of the left ilium extending to the left SI joint, favored to represent a chronic nondisplaced  fracture. Correlation for point tenderness at this location is recommended. Otherwise, no acute findings within the abdomen or pelvis. 3. Extensive coronary artery calcifications. Atherosclerotic plaque within a normal caliber thoracic aorta with possible hemodynamically significant narrowing involving the left subclavian artery. 4. Extensive atherosclerotic plaque within a normal caliber abdominal aorta. Atherosclerotic plaque extends to involve nearly all the major branch vessels of the abdominal aorta though the major branch vessels appear patent on this non CTA examination. 5. Incidental note made of an approximately 1.6 cm hypoattenuating nodule within the right lobe of the thyroid. Further evaluation with dedicated nonemergent thyroid ultrasound may be performed as clinically indicated. 6. Colonic diverticulosis without evidence of diverticulitis.   Electronically Signed   By: Simonne Come M.D.   On: 08/04/2013 19:54   Ct Cervical Spine Wo Contrast  08/04/2013   CLINICAL DATA:  Post fall from porch, now with bruising is to the right side of the forehead  EXAM: CT HEAD WITHOUT CONTRAST  CT CERVICAL SPINE WITHOUT CONTRAST  TECHNIQUE: Multidetector CT imaging of the head and cervical spine was performed following the standard protocol without intravenous contrast. Multiplanar CT image reconstructions of the cervical spine were also generated.  COMPARISON:  Head CT -12/29/20008  FINDINGS: CT HEAD FINDINGS  There is a minimal amount of soft tissue stranding about the left temporal calvarium (images 14 and 22, series 2). This finding is without associated displaced calvarial fracture or radiopaque foreign body. No acute intraparenchymal or extra-axial hemorrhage. Mild, likely  age-appropriate, atrophy with mild diffuse sulcal prominence. Minimal calcifications within the bilateral basal ganglia. Gray-white differentiation is maintained without CT evidence of acute large territory infarct. No intraparenchymal or extra-axial mass. Unchanged size and configuration of the ventricles and basilar cisterns. Limited visualization of the paranasal sinuses and mastoid air cells are normal. Post bilateral cataract surgery. Incidental note is made of a torus palatini.  CT CERVICAL SPINE FINDINGS  C1 into the superior endplate of T4 is imaged.  Mild accentuation of the cervical lordosis. There is very minimal (approximately 2 mm) of anterolisthesis of C6 upon C7. The bilateral facets are normally aligned. There is ankylosis of the left-sided C2 - C3 and C3-C4 transverse process. The dens is normally positioned between the lateral masses of C1. Moderate to severe degenerative change of the atlantodental articulation. Normal atlantoaxial articulations.  No fracture or static subluxation of the cervical spine. Cervical vertebral body heights are preserved. Prevertebral soft tissues are normal.  There is moderate to severe multilevel cervical spine DDD, worst at C5-C6 and to a lesser extent, C4-C5 and C6-C7 with disk space height loss, endplate irregularity and sclerosis.  Calcifications within the bilateral carotid bulbs, right greater than left. Calcifications within the left vertebral artery note is made of an approximately 1.8 x 1.7 cm hypo attenuating partially exophytic nodule arising from the right lobe of the thyroid (image 82, series 5). A surgical clip is seen anterior to the left SCM musculature. Calcifications within the image the aortic arch.  Limited visualization of the lung apices demonstrates grossly symmetric biapical pleural parenchymal thickening.  IMPRESSION: 1. Mild soft tissue stranding about the left temporal calvarium without associated displaced calvarial fracture, radiopaque  foreign body or acute intracranial process. 2. No fracture or static subluxation of the cervical spine 3. Moderate to severe multilevel cervical spine DDD, likely worse at C5-C6. 4. Indeterminate approximately 1.8 cm partially exophytic hypo attenuating nodule arising from the right lobe of the thyroid. Further evaluation with nonemergent dedicated thyroid ultrasound may  be obtained as clinically indicated.   Electronically Signed   By: Simonne Come M.D.   On: 08/04/2013 19:38   Ct Abdomen Pelvis W Contrast  08/04/2013   CLINICAL DATA:  Post fall from porch, now with right-sided rib pain  EXAM: CT CHEST, ABDOMEN, AND PELVIS WITH CONTRAST  TECHNIQUE: Multidetector CT imaging of the chest, abdomen and pelvis was performed following the standard protocol during bolus administration of intravenous contrast.  CONTRAST:  65mL OMNIPAQUE IOHEXOL 300 MG/ML  SOLN  COMPARISON:  None.  FINDINGS: CT CHEST FINDINGS  There is a minimally displaced fracture involving the anterior lateral aspect of the right 7th rib (best seen on coronal image 44) with associated trace right-sided pneumothorax (images 18 and 21 and 35, series 4). No pleural effusion.  There is minimal subsegmental atelectasis within the bilateral lower lobes. Grossly symmetric biapical pleural parenchymal thickening. No focal airspace opacities. No discrete pulmonary nodules. The central pulmonary airways are widely patent. Scattered shotty mediastinal lymph nodes are individually not enlarged by size criteria. No mediastinal, hilar or axillary lymphadenopathy.  Normal heart size. Extensive coronary artery calcifications. No pericardial effusion.  Scattered atherosclerotic plaque within a normal caliber thoracic aorta. No definite thoracic aortic dissection on this non CT examination. Extensive atherosclerotic plaque involves of the origin and proximal aspects of the major great vessels of the arch, in particular, the left subclavian artery, possibly resulting in  hemodynamically significant narrowing. .  Note is made of an approximately 1.6 x 1.2 cm hypoattenuating nodule within the right lobe of the thyroid (image 11, series 3).  CT ABDOMEN AND PELVIS FINDINGS  Normal hepatic contour. No discrete hepatic lesions. Normal appearance of the gallbladder. No intra or extrahepatic biliary ductal dilatation. No ascites.  There is symmetric enhancement and excretion of the bilateral kidneys. There is very minimal asymmetric atrophy of the left kidney in relation to the right. No definite renal stones on this postcontrast examination. No discrete renal lesions. Normal appearance of the bilateral adrenal glands, pancreas and spleen.  Extensive colonic diverticulosis without evidence of diverticulitis. It would bowel is otherwise normal in course and caliber without wall thickening or evidence of obstruction. Normal appearance of the appendix. No pneumoperitoneum, pneumatosis or portal venous gas.  There is rather extensive mixed calcified and noncalcified atherosclerotic plaque within the abdominal aorta. There is mild ectasia of the infrarenal abdominal aorta measuring approximately 1.9 cm in greatest short axis oblique axial dimension (image 73, series 3). There are is extensive atherosclerotic plaque involving the origin of the major branch vessels of the abdominal aorta, however these vessels remain patent on this non CTA examination. No retroperitoneal, mesenteric, pelvic or inguinal lymphadenopathy.  Normal diminutive appearance of the pelvic organs for age. No discrete adnexal lesion. No free fluid within the pelvis.  Asymmetric degenerate changes of the left SI joint. There is a serpiginous lucency through the posterior aspect of the left ilium which extends to the left SI joint (axial image 93, series 3) which may represent an age-indeterminate though presumably chronic nondisplaced fracture. There is approximately 9 mm of anterolisthesis of L4 upon L5 with associated severe  DDD at this level.  IMPRESSION: 1. Minimally displaced fracture involving the anterior lateral aspect of the right 7th ribs with associated trace right-sided pneumothorax. No pleural effusion. 2. Asymmetric degenerative change of the left SI joint with associated serpiginous lucency involving the posterior aspect of the left ilium extending to the left SI joint, favored to represent a chronic nondisplaced fracture. Correlation  for point tenderness at this location is recommended. Otherwise, no acute findings within the abdomen or pelvis. 3. Extensive coronary artery calcifications. Atherosclerotic plaque within a normal caliber thoracic aorta with possible hemodynamically significant narrowing involving the left subclavian artery. 4. Extensive atherosclerotic plaque within a normal caliber abdominal aorta. Atherosclerotic plaque extends to involve nearly all the major branch vessels of the abdominal aorta though the major branch vessels appear patent on this non CTA examination. 5. Incidental note made of an approximately 1.6 cm hypoattenuating nodule within the right lobe of the thyroid. Further evaluation with dedicated nonemergent thyroid ultrasound may be performed as clinically indicated. 6. Colonic diverticulosis without evidence of diverticulitis.   Electronically Signed   By: Simonne Come M.D.   On: 08/04/2013 19:54    EKG Interpretation   None       MDM  Patient is a 79 year old Caucasian female who comes to the emergency department today as a result of a fall. Physical exam as above. Initial workup included a CT of her head, CT of her C-spine, CT of her chest abdomen and pelvis, lactic acid, CBC, and CMP. Lactic acid 1.39. CBC had a WBC of 12.1. CMP was unremarkable. CT of the head demonstrated a hematoma to the left temporal region no other acute abnormalities. CT of the C-spine demonstrated no fracture. CT of the chest demonstrated a right seventh rib fracture with trace pneumothorax. Patient  was felt to require admission to the hospital for pain control observation overnight. Trauma surgery was consulted. On emergency department patient's pain was controlled with 1.5 mg of Dilaudid IV. She was administered a normal saline bolus. Patient was admitted to the trauma service in stable condition. Labs and imaging reviewed by myself and considered in medical decision-making. Imaging was interpreted by radiology. Care was discussed with my attending Dr. Radford Pax.    1. Right rib fracture, closed, initial encounter   2. Pneumothorax, right   3. Scalp abrasion, initial encounter   4. Scalp contusion, initial encounter   5. Fall, initial encounter       Bethann Berkshire, MD 08/04/13 [phone]

## 2013-08-04 NOTE — ED Notes (Signed)
Pt from home. Was on porch, missed handrail and fell off side of porch approximately 2.5-3 feet onto the ground. Pt has a bruise to right forehead, and bruising to right side/rib cage region. Pt complaining of right rib pain. Pt refused C-Collar and LSB. Pt alert and oriented x 4, neuro intact. Pt is very hard of hearing. Pt denies nausea, SOB, CP at this time.

## 2013-08-04 NOTE — H&P (Signed)
Lori [name] is an 79 y.o. female.   Chief Complaint: right chest pain after fall HPI: patient was walking up some steps when she fell over the railing onto the ground. She landed on her right side.She struck her head on the side of the house on the way down but had no loss of consciousness. She had significant right chest pain after falling.she was evaluated in the emergency department. She was not a trauma code activation. She was found to have a right seventh rib fracture and trace pneumothorax. I was asked to see her for admission to the trauma service. She continues to have some right rib pain exacerbated by deep breaths. Her husband and daughter are present and aid with the history.  Past Medical History  Diagnosis Date  . Carotid artery stenosis     Left carotid endarterectomy 02/2001  . Hypertension   . Hyperlipidemia   . Thyroid disease     Euthyroid 02/2012  . CAD (coronary artery disease)     a. NSTEMI in 2011 s/p PCI/DES to distal Cx, s/p PCI/DES to OM2 (EF 40%). b. NSTEMI 02/2012 treated conservatively.  . Arthritis   . CHF (congestive heart failure)     a. EF 40% in 2011. b. EF 35-40% by echo 03/12/12.  [name] Kitchen Respiratory failure     02/2012 - requiring bipap - multifactorial in setting of PNA/CHF/NSTEMI  . Pneumonia     02/2012  . Hyponatremia     Noted on 02/2012 admission  . Renal insufficiency     Noted on 02/2012 admission    Past Surgical History  Procedure Laterality Date  . Carotid endarterectomy      left 02/2001  . Cardiac catheterization      Family History  Problem Relation Age of Onset  . Prostate cancer    . Heart failure Mother 80  . Coronary artery disease Brother 1    CABG, alive at 3  . Coronary artery disease Brother 22    Died  . Heart failure Sister   . Heart failure Sister    Social History:  reports that she has never smoked. She has never used smokeless tobacco. She reports that she does not drink alcohol or use illicit drugs.  Allergies:  No Known Allergies   (Not in a hospital admission)  Results for orders placed during the hospital encounter of 08/04/13 (from the past 48 hour(s))  CBC WITH DIFFERENTIAL     Status: Abnormal   Collection Time    08/04/13  5:00 PM      Result Value Range   WBC 12.1 (*) 4.0 - 10.5 K/uL   RBC 4.00  3.87 - 5.11 MIL/uL   Hemoglobin 11.8 (*) 12.0 - 15.0 g/dL   HCT 16.1 (*) 09.6 - 04.5 %   MCV 89.0  78.0 - 100.0 fL   MCH 29.5  26.0 - 34.0 pg   MCHC 33.1  30.0 - 36.0 g/dL   RDW 40.9  81.1 - 91.4 %   Platelets 210  150 - 400 K/uL   Neutrophils Relative % 65  43 - 77 %   Neutro Abs 7.8 (*) 1.7 - 7.7 K/uL   Lymphocytes Relative 23  12 - 46 %   Lymphs Abs 2.7  0.7 - 4.0 K/uL   Monocytes Relative 8  3 - 12 %   Monocytes Absolute 1.0  0.1 - 1.0 K/uL   Eosinophils Relative 4  0 - 5 %   Eosinophils Absolute 0.5  0.0 - 0.7 K/uL   Basophils Relative 0  0 - 1 %   Basophils Absolute 0.0  0.0 - 0.1 K/uL  COMPREHENSIVE METABOLIC PANEL     Status: Abnormal   Collection Time    08/04/13  5:00 PM      Result Value Range   Sodium 136  135 - 145 mEq/L   Potassium 4.6  3.5 - 5.1 mEq/L   Chloride 97  96 - 112 mEq/L   CO2 30  19 - 32 mEq/L   Glucose, Bld 110 (*) 70 - 99 mg/dL   BUN 28 (*) 6 - 23 mg/dL   Creatinine, Ser 1.61 (*) 0.50 - 1.10 mg/dL   Calcium 8.4  8.4 - 09.6 mg/dL   Total Protein 6.6  6.0 - 8.3 g/dL   Albumin 3.5  3.5 - 5.2 g/dL   AST 26  0 - 37 U/L   ALT 18  0 - 35 U/L   Alkaline Phosphatase 69  39 - 117 U/L   Total Bilirubin 0.2 (*) 0.3 - 1.2 mg/dL   GFR calc non Af Amer 40 (*) >90 mL/min   GFR calc Af Amer 46 (*) >90 mL/min   Comment: (NOTE)     The eGFR has been calculated using the CKD EPI equation.     This calculation has not been validated in all clinical situations.     eGFR's persistently <90 mL/min signify possible Chronic Kidney     Disease.  CG4 I-STAT (LACTIC ACID)     Status: None   Collection Time    08/04/13  5:47 PM      Result Value Range   Lactic Acid,  Venous 1.39  0.5 - 2.2 mmol/L   Ct Head Wo Contrast  08/04/2013   CLINICAL DATA:  Post fall from porch, now with bruising is to the right side of the forehead  EXAM: CT HEAD WITHOUT CONTRAST  CT CERVICAL SPINE WITHOUT CONTRAST  TECHNIQUE: Multidetector CT imaging of the head and cervical spine was performed following the standard protocol without intravenous contrast. Multiplanar CT image reconstructions of the cervical spine were also generated.  COMPARISON:  Head CT -12/29/20008  FINDINGS: CT HEAD FINDINGS  There is a minimal amount of soft tissue stranding about the left temporal calvarium (images 14 and 22, series 2). This finding is without associated displaced calvarial fracture or radiopaque foreign body. No acute intraparenchymal or extra-axial hemorrhage. Mild, likely age-appropriate, atrophy with mild diffuse sulcal prominence. Minimal calcifications within the bilateral basal ganglia. Gray-white differentiation is maintained without CT evidence of acute large territory infarct. No intraparenchymal or extra-axial mass. Unchanged size and configuration of the ventricles and basilar cisterns. Limited visualization of the paranasal sinuses and mastoid air cells are normal. Post bilateral cataract surgery. Incidental note is made of a torus palatini.  CT CERVICAL SPINE FINDINGS  C1 into the superior endplate of T4 is imaged.  Mild accentuation of the cervical lordosis. There is very minimal (approximately 2 mm) of anterolisthesis of C6 upon C7. The bilateral facets are normally aligned. There is ankylosis of the left-sided C2 - C3 and C3-C4 transverse process. The dens is normally positioned between the lateral masses of C1. Moderate to severe degenerative change of the atlantodental articulation. Normal atlantoaxial articulations.  No fracture or static subluxation of the cervical spine. Cervical vertebral body heights are preserved. Prevertebral soft tissues are normal.  There is moderate to severe  multilevel cervical spine DDD, worst at C5-C6 and to a lesser  extent, C4-C5 and C6-C7 with disk space height loss, endplate irregularity and sclerosis.  Calcifications within the bilateral carotid bulbs, right greater than left. Calcifications within the left vertebral artery note is made of an approximately 1.8 x 1.7 cm hypo attenuating partially exophytic nodule arising from the right lobe of the thyroid (image 82, series 5). A surgical clip is seen anterior to the left SCM musculature. Calcifications within the image the aortic arch.  Limited visualization of the lung apices demonstrates grossly symmetric biapical pleural parenchymal thickening.  IMPRESSION: 1. Mild soft tissue stranding about the left temporal calvarium without associated displaced calvarial fracture, radiopaque foreign body or acute intracranial process. 2. No fracture or static subluxation of the cervical spine 3. Moderate to severe multilevel cervical spine DDD, likely worse at C5-C6. 4. Indeterminate approximately 1.8 cm partially exophytic hypo attenuating nodule arising from the right lobe of the thyroid. Further evaluation with nonemergent dedicated thyroid ultrasound may be obtained as clinically indicated.   Electronically Signed   By: Simonne Come M.D.   On: 08/04/2013 19:38   Ct Chest W Contrast  08/04/2013   CLINICAL DATA:  Post fall from porch, now with right-sided rib pain  EXAM: CT CHEST, ABDOMEN, AND PELVIS WITH CONTRAST  TECHNIQUE: Multidetector CT imaging of the chest, abdomen and pelvis was performed following the standard protocol during bolus administration of intravenous contrast.  CONTRAST:  65mL OMNIPAQUE IOHEXOL 300 MG/ML  SOLN  COMPARISON:  None.  FINDINGS: CT CHEST FINDINGS  There is a minimally displaced fracture involving the anterior lateral aspect of the right 7th rib (best seen on coronal image 44) with associated trace right-sided pneumothorax (images 18 and 21 and 35, series 4). No pleural effusion.  There is  minimal subsegmental atelectasis within the bilateral lower lobes. Grossly symmetric biapical pleural parenchymal thickening. No focal airspace opacities. No discrete pulmonary nodules. The central pulmonary airways are widely patent. Scattered shotty mediastinal lymph nodes are individually not enlarged by size criteria. No mediastinal, hilar or axillary lymphadenopathy.  Normal heart size. Extensive coronary artery calcifications. No pericardial effusion.  Scattered atherosclerotic plaque within a normal caliber thoracic aorta. No definite thoracic aortic dissection on this non CT examination. Extensive atherosclerotic plaque involves of the origin and proximal aspects of the major great vessels of the arch, in particular, the left subclavian artery, possibly resulting in hemodynamically significant narrowing. .  Note is made of an approximately 1.6 x 1.2 cm hypoattenuating nodule within the right lobe of the thyroid (image 11, series 3).  CT ABDOMEN AND PELVIS FINDINGS  Normal hepatic contour. No discrete hepatic lesions. Normal appearance of the gallbladder. No intra or extrahepatic biliary ductal dilatation. No ascites.  There is symmetric enhancement and excretion of the bilateral kidneys. There is very minimal asymmetric atrophy of the left kidney in relation to the right. No definite renal stones on this postcontrast examination. No discrete renal lesions. Normal appearance of the bilateral adrenal glands, pancreas and spleen.  Extensive colonic diverticulosis without evidence of diverticulitis. It would bowel is otherwise normal in course and caliber without wall thickening or evidence of obstruction. Normal appearance of the appendix. No pneumoperitoneum, pneumatosis or portal venous gas.  There is rather extensive mixed calcified and noncalcified atherosclerotic plaque within the abdominal aorta. There is mild ectasia of the infrarenal abdominal aorta measuring approximately 1.9 cm in greatest short axis  oblique axial dimension (image 73, series 3). There are is extensive atherosclerotic plaque involving the origin of the major branch vessels of the  abdominal aorta, however these vessels remain patent on this non CTA examination. No retroperitoneal, mesenteric, pelvic or inguinal lymphadenopathy.  Normal diminutive appearance of the pelvic organs for age. No discrete adnexal lesion. No free fluid within the pelvis.  Asymmetric degenerate changes of the left SI joint. There is a serpiginous lucency through the posterior aspect of the left ilium which extends to the left SI joint (axial image 93, series 3) which may represent an age-indeterminate though presumably chronic nondisplaced fracture. There is approximately 9 mm of anterolisthesis of L4 upon L5 with associated severe DDD at this level.  IMPRESSION: 1. Minimally displaced fracture involving the anterior lateral aspect of the right 7th ribs with associated trace right-sided pneumothorax. No pleural effusion. 2. Asymmetric degenerative change of the left SI joint with associated serpiginous lucency involving the posterior aspect of the left ilium extending to the left SI joint, favored to represent a chronic nondisplaced fracture. Correlation for point tenderness at this location is recommended. Otherwise, no acute findings within the abdomen or pelvis. 3. Extensive coronary artery calcifications. Atherosclerotic plaque within a normal caliber thoracic aorta with possible hemodynamically significant narrowing involving the left subclavian artery. 4. Extensive atherosclerotic plaque within a normal caliber abdominal aorta. Atherosclerotic plaque extends to involve nearly all the major branch vessels of the abdominal aorta though the major branch vessels appear patent on this non CTA examination. 5. Incidental note made of an approximately 1.6 cm hypoattenuating nodule within the right lobe of the thyroid. Further evaluation with dedicated nonemergent thyroid  ultrasound may be performed as clinically indicated. 6. Colonic diverticulosis without evidence of diverticulitis.   Electronically Signed   By: Simonne Come M.D.   On: 08/04/2013 19:54   Ct Cervical Spine Wo Contrast  08/04/2013   CLINICAL DATA:  Post fall from porch, now with bruising is to the right side of the forehead  EXAM: CT HEAD WITHOUT CONTRAST  CT CERVICAL SPINE WITHOUT CONTRAST  TECHNIQUE: Multidetector CT imaging of the head and cervical spine was performed following the standard protocol without intravenous contrast. Multiplanar CT image reconstructions of the cervical spine were also generated.  COMPARISON:  Head CT -12/29/20008  FINDINGS: CT HEAD FINDINGS  There is a minimal amount of soft tissue stranding about the left temporal calvarium (images 14 and 22, series 2). This finding is without associated displaced calvarial fracture or radiopaque foreign body. No acute intraparenchymal or extra-axial hemorrhage. Mild, likely age-appropriate, atrophy with mild diffuse sulcal prominence. Minimal calcifications within the bilateral basal ganglia. Gray-white differentiation is maintained without CT evidence of acute large territory infarct. No intraparenchymal or extra-axial mass. Unchanged size and configuration of the ventricles and basilar cisterns. Limited visualization of the paranasal sinuses and mastoid air cells are normal. Post bilateral cataract surgery. Incidental note is made of a torus palatini.  CT CERVICAL SPINE FINDINGS  C1 into the superior endplate of T4 is imaged.  Mild accentuation of the cervical lordosis. There is very minimal (approximately 2 mm) of anterolisthesis of C6 upon C7. The bilateral facets are normally aligned. There is ankylosis of the left-sided C2 - C3 and C3-C4 transverse process. The dens is normally positioned between the lateral masses of C1. Moderate to severe degenerative change of the atlantodental articulation. Normal atlantoaxial articulations.  No fracture  or static subluxation of the cervical spine. Cervical vertebral body heights are preserved. Prevertebral soft tissues are normal.  There is moderate to severe multilevel cervical spine DDD, worst at C5-C6 and to a lesser extent, C4-C5 and  C6-C7 with disk space height loss, endplate irregularity and sclerosis.  Calcifications within the bilateral carotid bulbs, right greater than left. Calcifications within the left vertebral artery note is made of an approximately 1.8 x 1.7 cm hypo attenuating partially exophytic nodule arising from the right lobe of the thyroid (image 82, series 5). A surgical clip is seen anterior to the left SCM musculature. Calcifications within the image the aortic arch.  Limited visualization of the lung apices demonstrates grossly symmetric biapical pleural parenchymal thickening.  IMPRESSION: 1. Mild soft tissue stranding about the left temporal calvarium without associated displaced calvarial fracture, radiopaque foreign body or acute intracranial process. 2. No fracture or static subluxation of the cervical spine 3. Moderate to severe multilevel cervical spine DDD, likely worse at C5-C6. 4. Indeterminate approximately 1.8 cm partially exophytic hypo attenuating nodule arising from the right lobe of the thyroid. Further evaluation with nonemergent dedicated thyroid ultrasound may be obtained as clinically indicated.   Electronically Signed   By: Simonne Come M.D.   On: 08/04/2013 19:38   Ct Abdomen Pelvis W Contrast  08/04/2013   CLINICAL DATA:  Post fall from porch, now with right-sided rib pain  EXAM: CT CHEST, ABDOMEN, AND PELVIS WITH CONTRAST  TECHNIQUE: Multidetector CT imaging of the chest, abdomen and pelvis was performed following the standard protocol during bolus administration of intravenous contrast.  CONTRAST:  65mL OMNIPAQUE IOHEXOL 300 MG/ML  SOLN  COMPARISON:  None.  FINDINGS: CT CHEST FINDINGS  There is a minimally displaced fracture involving the anterior lateral aspect  of the right 7th rib (best seen on coronal image 44) with associated trace right-sided pneumothorax (images 18 and 21 and 35, series 4). No pleural effusion.  There is minimal subsegmental atelectasis within the bilateral lower lobes. Grossly symmetric biapical pleural parenchymal thickening. No focal airspace opacities. No discrete pulmonary nodules. The central pulmonary airways are widely patent. Scattered shotty mediastinal lymph nodes are individually not enlarged by size criteria. No mediastinal, hilar or axillary lymphadenopathy.  Normal heart size. Extensive coronary artery calcifications. No pericardial effusion.  Scattered atherosclerotic plaque within a normal caliber thoracic aorta. No definite thoracic aortic dissection on this non CT examination. Extensive atherosclerotic plaque involves of the origin and proximal aspects of the major great vessels of the arch, in particular, the left subclavian artery, possibly resulting in hemodynamically significant narrowing. .  Note is made of an approximately 1.6 x 1.2 cm hypoattenuating nodule within the right lobe of the thyroid (image 11, series 3).  CT ABDOMEN AND PELVIS FINDINGS  Normal hepatic contour. No discrete hepatic lesions. Normal appearance of the gallbladder. No intra or extrahepatic biliary ductal dilatation. No ascites.  There is symmetric enhancement and excretion of the bilateral kidneys. There is very minimal asymmetric atrophy of the left kidney in relation to the right. No definite renal stones on this postcontrast examination. No discrete renal lesions. Normal appearance of the bilateral adrenal glands, pancreas and spleen.  Extensive colonic diverticulosis without evidence of diverticulitis. It would bowel is otherwise normal in course and caliber without wall thickening or evidence of obstruction. Normal appearance of the appendix. No pneumoperitoneum, pneumatosis or portal venous gas.  There is rather extensive mixed calcified and  noncalcified atherosclerotic plaque within the abdominal aorta. There is mild ectasia of the infrarenal abdominal aorta measuring approximately 1.9 cm in greatest short axis oblique axial dimension (image 73, series 3). There are is extensive atherosclerotic plaque involving the origin of the major branch vessels of the abdominal aorta,  however these vessels remain patent on this non CTA examination. No retroperitoneal, mesenteric, pelvic or inguinal lymphadenopathy.  Normal diminutive appearance of the pelvic organs for age. No discrete adnexal lesion. No free fluid within the pelvis.  Asymmetric degenerate changes of the left SI joint. There is a serpiginous lucency through the posterior aspect of the left ilium which extends to the left SI joint (axial image 93, series 3) which may represent an age-indeterminate though presumably chronic nondisplaced fracture. There is approximately 9 mm of anterolisthesis of L4 upon L5 with associated severe DDD at this level.  IMPRESSION: 1. Minimally displaced fracture involving the anterior lateral aspect of the right 7th ribs with associated trace right-sided pneumothorax. No pleural effusion. 2. Asymmetric degenerative change of the left SI joint with associated serpiginous lucency involving the posterior aspect of the left ilium extending to the left SI joint, favored to represent a chronic nondisplaced fracture. Correlation for point tenderness at this location is recommended. Otherwise, no acute findings within the abdomen or pelvis. 3. Extensive coronary artery calcifications. Atherosclerotic plaque within a normal caliber thoracic aorta with possible hemodynamically significant narrowing involving the left subclavian artery. 4. Extensive atherosclerotic plaque within a normal caliber abdominal aorta. Atherosclerotic plaque extends to involve nearly all the major branch vessels of the abdominal aorta though the major branch vessels appear patent on this non CTA  examination. 5. Incidental note made of an approximately 1.6 cm hypoattenuating nodule within the right lobe of the thyroid. Further evaluation with dedicated nonemergent thyroid ultrasound may be performed as clinically indicated. 6. Colonic diverticulosis without evidence of diverticulitis.   Electronically Signed   By: Simonne Come M.D.   On: 08/04/2013 19:54    Review of Systems  Constitutional: Negative.   HENT:       Mild left scalp pain, chronically hard of hearing  Eyes: Negative.   Respiratory:       Pain with deep inspiration right rib area  Cardiovascular: Positive for chest pain.       Right chest pain  Gastrointestinal: Negative.   Genitourinary: Negative.   Musculoskeletal: Negative.   Skin: Negative.   Neurological:       History of pain after a shingles episode many years ago  Endo/Heme/Allergies: Negative.   Psychiatric/Behavioral: Negative.     Blood pressure 163/45, pulse 79, temperature 98.1 F (36.7 C), temperature source Oral, resp. rate 19, SpO2 100.00%. Physical Exam  Constitutional: She appears well-developed and well-nourished. No distress.  HENT:  Head: Normocephalic. Head is with abrasion.    Right Ear: No hemotympanum.  Left Ear: No hemotympanum.  Nose: No mucosal edema, rhinorrhea or sinus tenderness.  Mouth/Throat: Mucous membranes are not dry. No oropharyngeal exudate.  Left scalp abrasion, decreased hearing acuity  Neck: Normal range of motion. No tracheal deviation present.  No posterior midline tenderness  Cardiovascular: Normal rate, regular rhythm and normal heart sounds.   No murmur heard. Respiratory: Effort normal and breath sounds normal. No stridor. No respiratory distress. She has no wheezes. She has no rales. She exhibits tenderness.  Right rib tenderness without crepitance  GI: Soft. Bowel sounds are normal. She exhibits no distension and no mass. There is no tenderness. There is no rebound and no guarding.  Musculoskeletal: Normal  range of motion. She exhibits no edema and no tenderness.  Neurological: She is alert. She displays no atrophy and no tremor. No sensory deficit. She displays no seizure activity. GCS eye subscore is 4. GCS verbal subscore is 5. GCS  motor subscore is 6.  Moves all extremities well  Skin: Skin is warm.  Psychiatric: She has a normal mood and affect.     Assessment/Plan Status post fall with right seventh rib fracture and trace pneumothorax. Will admit to step down unit for pain control and pulmonary toilet. Followup chest x-ray in the a.m.We'll plan physical therapy evaluation tomorrow. Plan was discussed in detail with the patient and her husband and daughter.  Arva Slaugh E 08/04/2013, 10:28 PM

## 2013-08-05 ENCOUNTER — Inpatient Hospital Stay (HOSPITAL_COMMUNITY): Payer: Medicare Other

## 2013-08-05 DIAGNOSIS — IMO0002 Reserved for concepts with insufficient information to code with codable children: Secondary | ICD-10-CM | POA: Diagnosis not present

## 2013-08-05 DIAGNOSIS — S270XXA Traumatic pneumothorax, initial encounter: Secondary | ICD-10-CM | POA: Diagnosis not present

## 2013-08-05 DIAGNOSIS — S2239XA Fracture of one rib, unspecified side, initial encounter for closed fracture: Secondary | ICD-10-CM | POA: Diagnosis not present

## 2013-08-05 LAB — CBC
Hemoglobin: 10.9 g/dL — ABNORMAL LOW (ref 12.0–15.0)
MCH: 30.2 pg (ref 26.0–34.0)
MCHC: 33.9 g/dL (ref 30.0–36.0)
Platelets: 233 10*3/uL (ref 150–400)
WBC: 12.4 10*3/uL — ABNORMAL HIGH (ref 4.0–10.5)

## 2013-08-05 LAB — BASIC METABOLIC PANEL
CO2: 27 mEq/L (ref 19–32)
Calcium: 8.5 mg/dL (ref 8.4–10.5)
Creatinine, Ser: 1.1 mg/dL (ref 0.50–1.10)
GFR calc non Af Amer: 42 mL/min — ABNORMAL LOW (ref >90)
Glucose, Bld: 109 mg/dL — ABNORMAL HIGH (ref 70–99)
Sodium: 136 mEq/L (ref 135–145)

## 2013-08-05 MED ORDER — CLOPIDOGREL BISULFATE 75 MG PO TABS
75.0000 mg | ORAL_TABLET | Freq: Every day | ORAL | Status: DC
  Administered 2013-08-05 – 2013-08-11 (×7): 75 mg via ORAL
  Filled 2013-08-05 (×8): qty 1

## 2013-08-05 MED ORDER — FUROSEMIDE 20 MG PO TABS
20.0000 mg | ORAL_TABLET | Freq: Every day | ORAL | Status: DC
  Administered 2013-08-05 – 2013-08-11 (×6): 20 mg via ORAL
  Filled 2013-08-05 (×7): qty 1

## 2013-08-05 MED ORDER — ISOSORBIDE MONONITRATE 15 MG HALF TABLET
15.0000 mg | ORAL_TABLET | Freq: Every day | ORAL | Status: DC
  Administered 2013-08-05 – 2013-08-11 (×7): 15 mg via ORAL
  Filled 2013-08-05 (×8): qty 1

## 2013-08-05 MED ORDER — LEVOTHYROXINE SODIUM 50 MCG PO TABS
50.0000 ug | ORAL_TABLET | Freq: Every day | ORAL | Status: DC
  Administered 2013-08-05 – 2013-08-11 (×7): 50 ug via ORAL
  Filled 2013-08-05 (×8): qty 1

## 2013-08-05 MED ORDER — ASPIRIN EC 81 MG PO TBEC
81.0000 mg | DELAYED_RELEASE_TABLET | Freq: Every day | ORAL | Status: DC
  Administered 2013-08-05 – 2013-08-11 (×7): 81 mg via ORAL
  Filled 2013-08-05 (×7): qty 1

## 2013-08-05 MED ORDER — PANTOPRAZOLE SODIUM 40 MG IV SOLR
40.0000 mg | Freq: Every day | INTRAVENOUS | Status: DC
  Filled 2013-08-05: qty 40

## 2013-08-05 MED ORDER — PANTOPRAZOLE SODIUM 40 MG PO TBEC
40.0000 mg | DELAYED_RELEASE_TABLET | Freq: Every day | ORAL | Status: DC
  Administered 2013-08-05 – 2013-08-11 (×7): 40 mg via ORAL
  Filled 2013-08-05 (×7): qty 1

## 2013-08-05 MED ORDER — CARVEDILOL 3.125 MG PO TABS
3.1250 mg | ORAL_TABLET | Freq: Two times a day (BID) | ORAL | Status: DC
  Administered 2013-08-05 – 2013-08-11 (×13): 3.125 mg via ORAL
  Filled 2013-08-05 (×15): qty 1

## 2013-08-05 NOTE — Progress Notes (Signed)
Patient ID: Lori [name], female   DOB: November 26, 1918, 79 y.o.   MRN: [id number]    Subjective: Pt feels ok today.  C/o right sided chest soreness.  Breathing well with no SOB.  No other c/o.  Objective: Vital signs in last 24 hours: Temp:  [97.8 F (36.6 C)-98.1 F (36.7 C)] 97.9 F (36.6 C) (11/15 0443) Pulse Rate:  [71-84] 75 (11/15 0700) Resp:  [9-26] 13 (11/15 0700) BP: (128-177)/(38-83) 144/47 mmHg (11/15 0700) SpO2:  [95 %-100 %] 100 % (11/15 0700) Last BM Date: 08/05/13  Intake/Output from previous day: 11/14 0701 - 11/15 0700 In: 105 [I.V.:105] Out: -  Intake/Output this shift:    PE: Gen: sleeping, NAD Heart: regular Lungs: CTAB, sats 100% on 2 L Abd: soft, NT, ND  Lab Results:   Recent Labs  08/04/13 1700 08/05/13 0355  WBC 12.1* 12.4*  HGB 11.8* 10.9*  HCT 35.6* 32.2*  PLT 210 233   BMET  Recent Labs  08/04/13 1700 08/05/13 0355  NA 136 136  K 4.6 4.6  CL 97 102  CO2 30 27  GLUCOSE 110* 109*  BUN 28* 22  CREATININE 1.15* 1.10  CALCIUM 8.4 8.5   PT/INR No results found for this basename: LABPROT, INR,  in the last 72 hours CMP     Component Value Date/Time   NA 136 08/05/2013 0355   K 4.6 08/05/2013 0355   CL 102 08/05/2013 0355   CO2 27 08/05/2013 0355   GLUCOSE 109* 08/05/2013 0355   BUN 22 08/05/2013 0355   CREATININE 1.10 08/05/2013 0355   CALCIUM 8.5 08/05/2013 0355   PROT 6.6 08/04/2013 1700   ALBUMIN 3.5 08/04/2013 1700   AST 26 08/04/2013 1700   ALT 18 08/04/2013 1700   ALKPHOS 69 08/04/2013 1700   BILITOT 0.2* 08/04/2013 1700   GFRNONAA 42* 08/05/2013 0355   GFRAA 49* 08/05/2013 0355   Lipase  No results found for this basename: lipase       Studies/Results: Ct Head Wo Contrast  08/04/2013   CLINICAL DATA:  Post fall from porch, now with bruising is to the right side of the forehead  EXAM: CT HEAD WITHOUT CONTRAST  CT CERVICAL SPINE WITHOUT CONTRAST  TECHNIQUE: Multidetector CT imaging of the head and cervical  spine was performed following the standard protocol without intravenous contrast. Multiplanar CT image reconstructions of the cervical spine were also generated.  COMPARISON:  Head CT -12/29/20008  FINDINGS: CT HEAD FINDINGS  There is a minimal amount of soft tissue stranding about the left temporal calvarium (images 14 and 22, series 2). This finding is without associated displaced calvarial fracture or radiopaque foreign body. No acute intraparenchymal or extra-axial hemorrhage. Mild, likely age-appropriate, atrophy with mild diffuse sulcal prominence. Minimal calcifications within the bilateral basal ganglia. Gray-white differentiation is maintained without CT evidence of acute large territory infarct. No intraparenchymal or extra-axial mass. Unchanged size and configuration of the ventricles and basilar cisterns. Limited visualization of the paranasal sinuses and mastoid air cells are normal. Post bilateral cataract surgery. Incidental note is made of a torus palatini.  CT CERVICAL SPINE FINDINGS  C1 into the superior endplate of T4 is imaged.  Mild accentuation of the cervical lordosis. There is very minimal (approximately 2 mm) of anterolisthesis of C6 upon C7. The bilateral facets are normally aligned. There is ankylosis of the left-sided C2 - C3 and C3-C4 transverse process. The dens is normally positioned between the lateral masses of C1. Moderate to severe degenerative  change of the atlantodental articulation. Normal atlantoaxial articulations.  No fracture or static subluxation of the cervical spine. Cervical vertebral body heights are preserved. Prevertebral soft tissues are normal.  There is moderate to severe multilevel cervical spine DDD, worst at C5-C6 and to a lesser extent, C4-C5 and C6-C7 with disk space height loss, endplate irregularity and sclerosis.  Calcifications within the bilateral carotid bulbs, right greater than left. Calcifications within the left vertebral artery note is made of an  approximately 1.8 x 1.7 cm hypo attenuating partially exophytic nodule arising from the right lobe of the thyroid (image 82, series 5). A surgical clip is seen anterior to the left SCM musculature. Calcifications within the image the aortic arch.  Limited visualization of the lung apices demonstrates grossly symmetric biapical pleural parenchymal thickening.  IMPRESSION: 1. Mild soft tissue stranding about the left temporal calvarium without associated displaced calvarial fracture, radiopaque foreign body or acute intracranial process. 2. No fracture or static subluxation of the cervical spine 3. Moderate to severe multilevel cervical spine DDD, likely worse at C5-C6. 4. Indeterminate approximately 1.8 cm partially exophytic hypo attenuating nodule arising from the right lobe of the thyroid. Further evaluation with nonemergent dedicated thyroid ultrasound may be obtained as clinically indicated.   Electronically Signed   By: Simonne Come M.D.   On: 08/04/2013 19:38   Ct Chest W Contrast  08/04/2013   CLINICAL DATA:  Post fall from porch, now with right-sided rib pain  EXAM: CT CHEST, ABDOMEN, AND PELVIS WITH CONTRAST  TECHNIQUE: Multidetector CT imaging of the chest, abdomen and pelvis was performed following the standard protocol during bolus administration of intravenous contrast.  CONTRAST:  65mL OMNIPAQUE IOHEXOL 300 MG/ML  SOLN  COMPARISON:  None.  FINDINGS: CT CHEST FINDINGS  There is a minimally displaced fracture involving the anterior lateral aspect of the right 7th rib (best seen on coronal image 44) with associated trace right-sided pneumothorax (images 18 and 21 and 35, series 4). No pleural effusion.  There is minimal subsegmental atelectasis within the bilateral lower lobes. Grossly symmetric biapical pleural parenchymal thickening. No focal airspace opacities. No discrete pulmonary nodules. The central pulmonary airways are widely patent. Scattered shotty mediastinal lymph nodes are individually  not enlarged by size criteria. No mediastinal, hilar or axillary lymphadenopathy.  Normal heart size. Extensive coronary artery calcifications. No pericardial effusion.  Scattered atherosclerotic plaque within a normal caliber thoracic aorta. No definite thoracic aortic dissection on this non CT examination. Extensive atherosclerotic plaque involves of the origin and proximal aspects of the major great vessels of the arch, in particular, the left subclavian artery, possibly resulting in hemodynamically significant narrowing. .  Note is made of an approximately 1.6 x 1.2 cm hypoattenuating nodule within the right lobe of the thyroid (image 11, series 3).  CT ABDOMEN AND PELVIS FINDINGS  Normal hepatic contour. No discrete hepatic lesions. Normal appearance of the gallbladder. No intra or extrahepatic biliary ductal dilatation. No ascites.  There is symmetric enhancement and excretion of the bilateral kidneys. There is very minimal asymmetric atrophy of the left kidney in relation to the right. No definite renal stones on this postcontrast examination. No discrete renal lesions. Normal appearance of the bilateral adrenal glands, pancreas and spleen.  Extensive colonic diverticulosis without evidence of diverticulitis. It would bowel is otherwise normal in course and caliber without wall thickening or evidence of obstruction. Normal appearance of the appendix. No pneumoperitoneum, pneumatosis or portal venous gas.  There is rather extensive mixed calcified and noncalcified  atherosclerotic plaque within the abdominal aorta. There is mild ectasia of the infrarenal abdominal aorta measuring approximately 1.9 cm in greatest short axis oblique axial dimension (image 73, series 3). There are is extensive atherosclerotic plaque involving the origin of the major branch vessels of the abdominal aorta, however these vessels remain patent on this non CTA examination. No retroperitoneal, mesenteric, pelvic or inguinal  lymphadenopathy.  Normal diminutive appearance of the pelvic organs for age. No discrete adnexal lesion. No free fluid within the pelvis.  Asymmetric degenerate changes of the left SI joint. There is a serpiginous lucency through the posterior aspect of the left ilium which extends to the left SI joint (axial image 93, series 3) which may represent an age-indeterminate though presumably chronic nondisplaced fracture. There is approximately 9 mm of anterolisthesis of L4 upon L5 with associated severe DDD at this level.  IMPRESSION: 1. Minimally displaced fracture involving the anterior lateral aspect of the right 7th ribs with associated trace right-sided pneumothorax. No pleural effusion. 2. Asymmetric degenerative change of the left SI joint with associated serpiginous lucency involving the posterior aspect of the left ilium extending to the left SI joint, favored to represent a chronic nondisplaced fracture. Correlation for point tenderness at this location is recommended. Otherwise, no acute findings within the abdomen or pelvis. 3. Extensive coronary artery calcifications. Atherosclerotic plaque within a normal caliber thoracic aorta with possible hemodynamically significant narrowing involving the left subclavian artery. 4. Extensive atherosclerotic plaque within a normal caliber abdominal aorta. Atherosclerotic plaque extends to involve nearly all the major branch vessels of the abdominal aorta though the major branch vessels appear patent on this non CTA examination. 5. Incidental note made of an approximately 1.6 cm hypoattenuating nodule within the right lobe of the thyroid. Further evaluation with dedicated nonemergent thyroid ultrasound may be performed as clinically indicated. 6. Colonic diverticulosis without evidence of diverticulitis.   Electronically Signed   By: Simonne Come M.D.   On: 08/04/2013 19:54   Ct Cervical Spine Wo Contrast  08/04/2013   CLINICAL DATA:  Post fall from porch, now with  bruising is to the right side of the forehead  EXAM: CT HEAD WITHOUT CONTRAST  CT CERVICAL SPINE WITHOUT CONTRAST  TECHNIQUE: Multidetector CT imaging of the head and cervical spine was performed following the standard protocol without intravenous contrast. Multiplanar CT image reconstructions of the cervical spine were also generated.  COMPARISON:  Head CT -12/29/20008  FINDINGS: CT HEAD FINDINGS  There is a minimal amount of soft tissue stranding about the left temporal calvarium (images 14 and 22, series 2). This finding is without associated displaced calvarial fracture or radiopaque foreign body. No acute intraparenchymal or extra-axial hemorrhage. Mild, likely age-appropriate, atrophy with mild diffuse sulcal prominence. Minimal calcifications within the bilateral basal ganglia. Gray-white differentiation is maintained without CT evidence of acute large territory infarct. No intraparenchymal or extra-axial mass. Unchanged size and configuration of the ventricles and basilar cisterns. Limited visualization of the paranasal sinuses and mastoid air cells are normal. Post bilateral cataract surgery. Incidental note is made of a torus palatini.  CT CERVICAL SPINE FINDINGS  C1 into the superior endplate of T4 is imaged.  Mild accentuation of the cervical lordosis. There is very minimal (approximately 2 mm) of anterolisthesis of C6 upon C7. The bilateral facets are normally aligned. There is ankylosis of the left-sided C2 - C3 and C3-C4 transverse process. The dens is normally positioned between the lateral masses of C1. Moderate to severe degenerative change of the  atlantodental articulation. Normal atlantoaxial articulations.  No fracture or static subluxation of the cervical spine. Cervical vertebral body heights are preserved. Prevertebral soft tissues are normal.  There is moderate to severe multilevel cervical spine DDD, worst at C5-C6 and to a lesser extent, C4-C5 and C6-C7 with disk space height loss,  endplate irregularity and sclerosis.  Calcifications within the bilateral carotid bulbs, right greater than left. Calcifications within the left vertebral artery note is made of an approximately 1.8 x 1.7 cm hypo attenuating partially exophytic nodule arising from the right lobe of the thyroid (image 82, series 5). A surgical clip is seen anterior to the left SCM musculature. Calcifications within the image the aortic arch.  Limited visualization of the lung apices demonstrates grossly symmetric biapical pleural parenchymal thickening.  IMPRESSION: 1. Mild soft tissue stranding about the left temporal calvarium without associated displaced calvarial fracture, radiopaque foreign body or acute intracranial process. 2. No fracture or static subluxation of the cervical spine 3. Moderate to severe multilevel cervical spine DDD, likely worse at C5-C6. 4. Indeterminate approximately 1.8 cm partially exophytic hypo attenuating nodule arising from the right lobe of the thyroid. Further evaluation with nonemergent dedicated thyroid ultrasound may be obtained as clinically indicated.   Electronically Signed   By: Simonne Come M.D.   On: 08/04/2013 19:38   Ct Abdomen Pelvis W Contrast  08/04/2013   CLINICAL DATA:  Post fall from porch, now with right-sided rib pain  EXAM: CT CHEST, ABDOMEN, AND PELVIS WITH CONTRAST  TECHNIQUE: Multidetector CT imaging of the chest, abdomen and pelvis was performed following the standard protocol during bolus administration of intravenous contrast.  CONTRAST:  65mL OMNIPAQUE IOHEXOL 300 MG/ML  SOLN  COMPARISON:  None.  FINDINGS: CT CHEST FINDINGS  There is a minimally displaced fracture involving the anterior lateral aspect of the right 7th rib (best seen on coronal image 44) with associated trace right-sided pneumothorax (images 18 and 21 and 35, series 4). No pleural effusion.  There is minimal subsegmental atelectasis within the bilateral lower lobes. Grossly symmetric biapical pleural  parenchymal thickening. No focal airspace opacities. No discrete pulmonary nodules. The central pulmonary airways are widely patent. Scattered shotty mediastinal lymph nodes are individually not enlarged by size criteria. No mediastinal, hilar or axillary lymphadenopathy.  Normal heart size. Extensive coronary artery calcifications. No pericardial effusion.  Scattered atherosclerotic plaque within a normal caliber thoracic aorta. No definite thoracic aortic dissection on this non CT examination. Extensive atherosclerotic plaque involves of the origin and proximal aspects of the major great vessels of the arch, in particular, the left subclavian artery, possibly resulting in hemodynamically significant narrowing. .  Note is made of an approximately 1.6 x 1.2 cm hypoattenuating nodule within the right lobe of the thyroid (image 11, series 3).  CT ABDOMEN AND PELVIS FINDINGS  Normal hepatic contour. No discrete hepatic lesions. Normal appearance of the gallbladder. No intra or extrahepatic biliary ductal dilatation. No ascites.  There is symmetric enhancement and excretion of the bilateral kidneys. There is very minimal asymmetric atrophy of the left kidney in relation to the right. No definite renal stones on this postcontrast examination. No discrete renal lesions. Normal appearance of the bilateral adrenal glands, pancreas and spleen.  Extensive colonic diverticulosis without evidence of diverticulitis. It would bowel is otherwise normal in course and caliber without wall thickening or evidence of obstruction. Normal appearance of the appendix. No pneumoperitoneum, pneumatosis or portal venous gas.  There is rather extensive mixed calcified and noncalcified atherosclerotic plaque  within the abdominal aorta. There is mild ectasia of the infrarenal abdominal aorta measuring approximately 1.9 cm in greatest short axis oblique axial dimension (image 73, series 3). There are is extensive atherosclerotic plaque involving  the origin of the major branch vessels of the abdominal aorta, however these vessels remain patent on this non CTA examination. No retroperitoneal, mesenteric, pelvic or inguinal lymphadenopathy.  Normal diminutive appearance of the pelvic organs for age. No discrete adnexal lesion. No free fluid within the pelvis.  Asymmetric degenerate changes of the left SI joint. There is a serpiginous lucency through the posterior aspect of the left ilium which extends to the left SI joint (axial image 93, series 3) which may represent an age-indeterminate though presumably chronic nondisplaced fracture. There is approximately 9 mm of anterolisthesis of L4 upon L5 with associated severe DDD at this level.  IMPRESSION: 1. Minimally displaced fracture involving the anterior lateral aspect of the right 7th ribs with associated trace right-sided pneumothorax. No pleural effusion. 2. Asymmetric degenerative change of the left SI joint with associated serpiginous lucency involving the posterior aspect of the left ilium extending to the left SI joint, favored to represent a chronic nondisplaced fracture. Correlation for point tenderness at this location is recommended. Otherwise, no acute findings within the abdomen or pelvis. 3. Extensive coronary artery calcifications. Atherosclerotic plaque within a normal caliber thoracic aorta with possible hemodynamically significant narrowing involving the left subclavian artery. 4. Extensive atherosclerotic plaque within a normal caliber abdominal aorta. Atherosclerotic plaque extends to involve nearly all the major branch vessels of the abdominal aorta though the major branch vessels appear patent on this non CTA examination. 5. Incidental note made of an approximately 1.6 cm hypoattenuating nodule within the right lobe of the thyroid. Further evaluation with dedicated nonemergent thyroid ultrasound may be performed as clinically indicated. 6. Colonic diverticulosis without evidence of  diverticulitis.   Electronically Signed   By: Simonne Come M.D.   On: 08/04/2013 19:54   Dg Chest Port 1 View  08/05/2013   CLINICAL DATA:  Right rib fracture and pneumothorax.  EXAM: PORTABLE CHEST - 1 VIEW  COMPARISON:  CT, 08/04/2013  FINDINGS: No radiographic evidence of a pneumothorax. The anterior right rib fracture is not resolved.  Reticular lung base opacities are noted consistent with atelectasis. No evidence of a pulmonary contusion or infiltrate. No pleural effusion is seen.  Cardiac silhouette is normal in size. Mediastinum is normal in contour.  IMPRESSION: 1. No pneumothorax seen radiographically. Right-sided rib fracture is also not resolved on the portable AP view. 2. Mild lung base atelectasis.   Electronically Signed   By: Amie Portland M.D.   On: 08/05/2013 07:18    Anti-infectives: Anti-infectives   None       Assessment/Plan  1. Fall 2. Right 7th rib fx with trace PTX Patient Active Problem List   Diagnosis Date Noted  . Right rib fracture 08/04/2013  . Pneumothorax, right 08/04/2013  . Scalp abrasion 08/04/2013  . Spinal stenosis of lumbar region 05/26/2013  . Chronic systolic heart failure 03/21/2012  . Ischemic cardiomyopathy 03/21/2012  . CKD (chronic kidney disease) 03/21/2012  . UTI (lower urinary tract infection) 03/13/2012  . Acute respiratory failure with hypoxia 03/12/2012  . Pulmonary edema 03/12/2012  . NSTEMI (non-ST elevated myocardial infarction) 03/12/2012  . Pneumonia 03/12/2012  . Tachycardia 03/04/2011  . Dyslipidemia 12/10/2010  . CORONARY ATHEROSCLEROSIS NATIVE CORONARY ARTERY 08/27/2010  . HYPERTENSION 08/26/2010  . CAROTID STENOSIS 08/26/2010   Plan: 1. Patient  is doing well this morning.  CXR shows no PTX.  Will wean her O2 to room air. 2. PT/OT eval 3. tx to the floor 4. Cont pain control for rib fracture and pulm toileting 5. All meds resumed from home for her other medical problems  LOS: 1 day    Lori [name]  E 08/05/2013, 8:09 AM Pager: [phone]

## 2013-08-05 NOTE — Progress Notes (Signed)
Pulmonary toilet.  To floor today.

## 2013-08-05 NOTE — Progress Notes (Signed)
Called to receive report from [employer] unavailable to give report at this time.Will monitor.

## 2013-08-05 NOTE — Progress Notes (Signed)
Pt alert x3, tx from ed no family present. Belonging with Pt. Place on the monitor NSR, V/S 142/44,69,98.1,18. Laceration note to lf frontal lobe. Discoloration noted to bil upper and lower extremities.  Pt states that she has an old shingles rash to her mid trunk region that has healed but has left scaring. Will monitor.

## 2013-08-06 ENCOUNTER — Encounter (HOSPITAL_COMMUNITY): Payer: Self-pay | Admitting: Orthopedic Surgery

## 2013-08-06 DIAGNOSIS — S2239XA Fracture of one rib, unspecified side, initial encounter for closed fracture: Secondary | ICD-10-CM | POA: Diagnosis not present

## 2013-08-06 DIAGNOSIS — R112 Nausea with vomiting, unspecified: Secondary | ICD-10-CM | POA: Diagnosis not present

## 2013-08-06 DIAGNOSIS — S270XXA Traumatic pneumothorax, initial encounter: Secondary | ICD-10-CM | POA: Diagnosis not present

## 2013-08-06 DIAGNOSIS — K560 Paralytic ileus: Secondary | ICD-10-CM | POA: Diagnosis not present

## 2013-08-06 DIAGNOSIS — IMO0002 Reserved for concepts with insufficient information to code with codable children: Secondary | ICD-10-CM | POA: Diagnosis not present

## 2013-08-06 MED ORDER — POLYETHYLENE GLYCOL 3350 17 G PO PACK
17.0000 g | PACK | Freq: Every day | ORAL | Status: DC
  Administered 2013-08-06 – 2013-08-07 (×2): 17 g via ORAL
  Filled 2013-08-06 (×3): qty 1

## 2013-08-06 NOTE — Progress Notes (Signed)
Patient ID: Lori [name], female   DOB: November 30, 1918, 79 y.o.   MRN: [id number]    Subjective: Pt sleeping.  No troubles breathing.  C/o some right sided rib pain.  PT/OT didn't see her yesterday.  She has not gotten out of bed.  Objective: Vital signs in last 24 hours: Temp:  [97.6 F (36.4 C)-98.5 F (36.9 C)] 98.3 F (36.8 C) (11/16 0452) Pulse Rate:  [70-86] 82 (11/16 0452) Resp:  [9-24] 18 (11/16 0452) BP: (118-160)/(38-73) 148/50 mmHg (11/16 0452) SpO2:  [89 %-100 %] 91 % (11/16 0452) Weight:  [155 lb 12.8 oz (70.67 kg)] 155 lb 12.8 oz (70.67 kg) (11/16 0452) Last BM Date: 08/05/13  Intake/Output from previous day: 11/15 0701 - 11/16 0700 In: 950 [P.O.:600; I.V.:350] Out: 300 [Urine:300] Intake/Output this shift:    PE: GEN: NAD Heart: regular Lungs: CTAB, some right sided chest wall tenderness  Lab Results:   Recent Labs  08/04/13 1700 08/05/13 0355  WBC 12.1* 12.4*  HGB 11.8* 10.9*  HCT 35.6* 32.2*  PLT 210 233   BMET  Recent Labs  08/04/13 1700 08/05/13 0355  NA 136 136  K 4.6 4.6  CL 97 102  CO2 30 27  GLUCOSE 110* 109*  BUN 28* 22  CREATININE 1.15* 1.10  CALCIUM 8.4 8.5   PT/INR No results found for this basename: LABPROT, INR,  in the last 72 hours CMP     Component Value Date/Time   NA 136 08/05/2013 0355   K 4.6 08/05/2013 0355   CL 102 08/05/2013 0355   CO2 27 08/05/2013 0355   GLUCOSE 109* 08/05/2013 0355   BUN 22 08/05/2013 0355   CREATININE 1.10 08/05/2013 0355   CALCIUM 8.5 08/05/2013 0355   PROT 6.6 08/04/2013 1700   ALBUMIN 3.5 08/04/2013 1700   AST 26 08/04/2013 1700   ALT 18 08/04/2013 1700   ALKPHOS 69 08/04/2013 1700   BILITOT 0.2* 08/04/2013 1700   GFRNONAA 42* 08/05/2013 0355   GFRAA 49* 08/05/2013 0355   Lipase  No results found for this basename: lipase       Studies/Results: Ct Head Wo Contrast  08/04/2013   CLINICAL DATA:  Post fall from porch, now with bruising is to the right side of the forehead   EXAM: CT HEAD WITHOUT CONTRAST  CT CERVICAL SPINE WITHOUT CONTRAST  TECHNIQUE: Multidetector CT imaging of the head and cervical spine was performed following the standard protocol without intravenous contrast. Multiplanar CT image reconstructions of the cervical spine were also generated.  COMPARISON:  Head CT -12/29/20008  FINDINGS: CT HEAD FINDINGS  There is a minimal amount of soft tissue stranding about the left temporal calvarium (images 14 and 22, series 2). This finding is without associated displaced calvarial fracture or radiopaque foreign body. No acute intraparenchymal or extra-axial hemorrhage. Mild, likely age-appropriate, atrophy with mild diffuse sulcal prominence. Minimal calcifications within the bilateral basal ganglia. Gray-white differentiation is maintained without CT evidence of acute large territory infarct. No intraparenchymal or extra-axial mass. Unchanged size and configuration of the ventricles and basilar cisterns. Limited visualization of the paranasal sinuses and mastoid air cells are normal. Post bilateral cataract surgery. Incidental note is made of a torus palatini.  CT CERVICAL SPINE FINDINGS  C1 into the superior endplate of T4 is imaged.  Mild accentuation of the cervical lordosis. There is very minimal (approximately 2 mm) of anterolisthesis of C6 upon C7. The bilateral facets are normally aligned. There is ankylosis of the left-sided C2 -  C3 and C3-C4 transverse process. The dens is normally positioned between the lateral masses of C1. Moderate to severe degenerative change of the atlantodental articulation. Normal atlantoaxial articulations.  No fracture or static subluxation of the cervical spine. Cervical vertebral body heights are preserved. Prevertebral soft tissues are normal.  There is moderate to severe multilevel cervical spine DDD, worst at C5-C6 and to a lesser extent, C4-C5 and C6-C7 with disk space height loss, endplate irregularity and sclerosis.  Calcifications  within the bilateral carotid bulbs, right greater than left. Calcifications within the left vertebral artery note is made of an approximately 1.8 x 1.7 cm hypo attenuating partially exophytic nodule arising from the right lobe of the thyroid (image 82, series 5). A surgical clip is seen anterior to the left SCM musculature. Calcifications within the image the aortic arch.  Limited visualization of the lung apices demonstrates grossly symmetric biapical pleural parenchymal thickening.  IMPRESSION: 1. Mild soft tissue stranding about the left temporal calvarium without associated displaced calvarial fracture, radiopaque foreign body or acute intracranial process. 2. No fracture or static subluxation of the cervical spine 3. Moderate to severe multilevel cervical spine DDD, likely worse at C5-C6. 4. Indeterminate approximately 1.8 cm partially exophytic hypo attenuating nodule arising from the right lobe of the thyroid. Further evaluation with nonemergent dedicated thyroid ultrasound may be obtained as clinically indicated.   Electronically Signed   By: Simonne Come M.D.   On: 08/04/2013 19:38   Ct Chest W Contrast  08/04/2013   CLINICAL DATA:  Post fall from porch, now with right-sided rib pain  EXAM: CT CHEST, ABDOMEN, AND PELVIS WITH CONTRAST  TECHNIQUE: Multidetector CT imaging of the chest, abdomen and pelvis was performed following the standard protocol during bolus administration of intravenous contrast.  CONTRAST:  65mL OMNIPAQUE IOHEXOL 300 MG/ML  SOLN  COMPARISON:  None.  FINDINGS: CT CHEST FINDINGS  There is a minimally displaced fracture involving the anterior lateral aspect of the right 7th rib (best seen on coronal image 44) with associated trace right-sided pneumothorax (images 18 and 21 and 35, series 4). No pleural effusion.  There is minimal subsegmental atelectasis within the bilateral lower lobes. Grossly symmetric biapical pleural parenchymal thickening. No focal airspace opacities. No discrete  pulmonary nodules. The central pulmonary airways are widely patent. Scattered shotty mediastinal lymph nodes are individually not enlarged by size criteria. No mediastinal, hilar or axillary lymphadenopathy.  Normal heart size. Extensive coronary artery calcifications. No pericardial effusion.  Scattered atherosclerotic plaque within a normal caliber thoracic aorta. No definite thoracic aortic dissection on this non CT examination. Extensive atherosclerotic plaque involves of the origin and proximal aspects of the major great vessels of the arch, in particular, the left subclavian artery, possibly resulting in hemodynamically significant narrowing. .  Note is made of an approximately 1.6 x 1.2 cm hypoattenuating nodule within the right lobe of the thyroid (image 11, series 3).  CT ABDOMEN AND PELVIS FINDINGS  Normal hepatic contour. No discrete hepatic lesions. Normal appearance of the gallbladder. No intra or extrahepatic biliary ductal dilatation. No ascites.  There is symmetric enhancement and excretion of the bilateral kidneys. There is very minimal asymmetric atrophy of the left kidney in relation to the right. No definite renal stones on this postcontrast examination. No discrete renal lesions. Normal appearance of the bilateral adrenal glands, pancreas and spleen.  Extensive colonic diverticulosis without evidence of diverticulitis. It would bowel is otherwise normal in course and caliber without wall thickening or evidence of obstruction. Normal  appearance of the appendix. No pneumoperitoneum, pneumatosis or portal venous gas.  There is rather extensive mixed calcified and noncalcified atherosclerotic plaque within the abdominal aorta. There is mild ectasia of the infrarenal abdominal aorta measuring approximately 1.9 cm in greatest short axis oblique axial dimension (image 73, series 3). There are is extensive atherosclerotic plaque involving the origin of the major branch vessels of the abdominal aorta,  however these vessels remain patent on this non CTA examination. No retroperitoneal, mesenteric, pelvic or inguinal lymphadenopathy.  Normal diminutive appearance of the pelvic organs for age. No discrete adnexal lesion. No free fluid within the pelvis.  Asymmetric degenerate changes of the left SI joint. There is a serpiginous lucency through the posterior aspect of the left ilium which extends to the left SI joint (axial image 93, series 3) which may represent an age-indeterminate though presumably chronic nondisplaced fracture. There is approximately 9 mm of anterolisthesis of L4 upon L5 with associated severe DDD at this level.  IMPRESSION: 1. Minimally displaced fracture involving the anterior lateral aspect of the right 7th ribs with associated trace right-sided pneumothorax. No pleural effusion. 2. Asymmetric degenerative change of the left SI joint with associated serpiginous lucency involving the posterior aspect of the left ilium extending to the left SI joint, favored to represent a chronic nondisplaced fracture. Correlation for point tenderness at this location is recommended. Otherwise, no acute findings within the abdomen or pelvis. 3. Extensive coronary artery calcifications. Atherosclerotic plaque within a normal caliber thoracic aorta with possible hemodynamically significant narrowing involving the left subclavian artery. 4. Extensive atherosclerotic plaque within a normal caliber abdominal aorta. Atherosclerotic plaque extends to involve nearly all the major branch vessels of the abdominal aorta though the major branch vessels appear patent on this non CTA examination. 5. Incidental note made of an approximately 1.6 cm hypoattenuating nodule within the right lobe of the thyroid. Further evaluation with dedicated nonemergent thyroid ultrasound may be performed as clinically indicated. 6. Colonic diverticulosis without evidence of diverticulitis.   Electronically Signed   By: Simonne Come M.D.   On:  08/04/2013 19:54   Ct Cervical Spine Wo Contrast  08/04/2013   CLINICAL DATA:  Post fall from porch, now with bruising is to the right side of the forehead  EXAM: CT HEAD WITHOUT CONTRAST  CT CERVICAL SPINE WITHOUT CONTRAST  TECHNIQUE: Multidetector CT imaging of the head and cervical spine was performed following the standard protocol without intravenous contrast. Multiplanar CT image reconstructions of the cervical spine were also generated.  COMPARISON:  Head CT -12/29/20008  FINDINGS: CT HEAD FINDINGS  There is a minimal amount of soft tissue stranding about the left temporal calvarium (images 14 and 22, series 2). This finding is without associated displaced calvarial fracture or radiopaque foreign body. No acute intraparenchymal or extra-axial hemorrhage. Mild, likely age-appropriate, atrophy with mild diffuse sulcal prominence. Minimal calcifications within the bilateral basal ganglia. Gray-white differentiation is maintained without CT evidence of acute large territory infarct. No intraparenchymal or extra-axial mass. Unchanged size and configuration of the ventricles and basilar cisterns. Limited visualization of the paranasal sinuses and mastoid air cells are normal. Post bilateral cataract surgery. Incidental note is made of a torus palatini.  CT CERVICAL SPINE FINDINGS  C1 into the superior endplate of T4 is imaged.  Mild accentuation of the cervical lordosis. There is very minimal (approximately 2 mm) of anterolisthesis of C6 upon C7. The bilateral facets are normally aligned. There is ankylosis of the left-sided C2 - C3 and C3-C4  transverse process. The dens is normally positioned between the lateral masses of C1. Moderate to severe degenerative change of the atlantodental articulation. Normal atlantoaxial articulations.  No fracture or static subluxation of the cervical spine. Cervical vertebral body heights are preserved. Prevertebral soft tissues are normal.  There is moderate to severe  multilevel cervical spine DDD, worst at C5-C6 and to a lesser extent, C4-C5 and C6-C7 with disk space height loss, endplate irregularity and sclerosis.  Calcifications within the bilateral carotid bulbs, right greater than left. Calcifications within the left vertebral artery note is made of an approximately 1.8 x 1.7 cm hypo attenuating partially exophytic nodule arising from the right lobe of the thyroid (image 82, series 5). A surgical clip is seen anterior to the left SCM musculature. Calcifications within the image the aortic arch.  Limited visualization of the lung apices demonstrates grossly symmetric biapical pleural parenchymal thickening.  IMPRESSION: 1. Mild soft tissue stranding about the left temporal calvarium without associated displaced calvarial fracture, radiopaque foreign body or acute intracranial process. 2. No fracture or static subluxation of the cervical spine 3. Moderate to severe multilevel cervical spine DDD, likely worse at C5-C6. 4. Indeterminate approximately 1.8 cm partially exophytic hypo attenuating nodule arising from the right lobe of the thyroid. Further evaluation with nonemergent dedicated thyroid ultrasound may be obtained as clinically indicated.   Electronically Signed   By: Simonne Come M.D.   On: 08/04/2013 19:38   Ct Abdomen Pelvis W Contrast  08/04/2013   CLINICAL DATA:  Post fall from porch, now with right-sided rib pain  EXAM: CT CHEST, ABDOMEN, AND PELVIS WITH CONTRAST  TECHNIQUE: Multidetector CT imaging of the chest, abdomen and pelvis was performed following the standard protocol during bolus administration of intravenous contrast.  CONTRAST:  65mL OMNIPAQUE IOHEXOL 300 MG/ML  SOLN  COMPARISON:  None.  FINDINGS: CT CHEST FINDINGS  There is a minimally displaced fracture involving the anterior lateral aspect of the right 7th rib (best seen on coronal image 44) with associated trace right-sided pneumothorax (images 18 and 21 and 35, series 4). No pleural effusion.   There is minimal subsegmental atelectasis within the bilateral lower lobes. Grossly symmetric biapical pleural parenchymal thickening. No focal airspace opacities. No discrete pulmonary nodules. The central pulmonary airways are widely patent. Scattered shotty mediastinal lymph nodes are individually not enlarged by size criteria. No mediastinal, hilar or axillary lymphadenopathy.  Normal heart size. Extensive coronary artery calcifications. No pericardial effusion.  Scattered atherosclerotic plaque within a normal caliber thoracic aorta. No definite thoracic aortic dissection on this non CT examination. Extensive atherosclerotic plaque involves of the origin and proximal aspects of the major great vessels of the arch, in particular, the left subclavian artery, possibly resulting in hemodynamically significant narrowing. .  Note is made of an approximately 1.6 x 1.2 cm hypoattenuating nodule within the right lobe of the thyroid (image 11, series 3).  CT ABDOMEN AND PELVIS FINDINGS  Normal hepatic contour. No discrete hepatic lesions. Normal appearance of the gallbladder. No intra or extrahepatic biliary ductal dilatation. No ascites.  There is symmetric enhancement and excretion of the bilateral kidneys. There is very minimal asymmetric atrophy of the left kidney in relation to the right. No definite renal stones on this postcontrast examination. No discrete renal lesions. Normal appearance of the bilateral adrenal glands, pancreas and spleen.  Extensive colonic diverticulosis without evidence of diverticulitis. It would bowel is otherwise normal in course and caliber without wall thickening or evidence of obstruction. Normal appearance of  the appendix. No pneumoperitoneum, pneumatosis or portal venous gas.  There is rather extensive mixed calcified and noncalcified atherosclerotic plaque within the abdominal aorta. There is mild ectasia of the infrarenal abdominal aorta measuring approximately 1.9 cm in greatest  short axis oblique axial dimension (image 73, series 3). There are is extensive atherosclerotic plaque involving the origin of the major branch vessels of the abdominal aorta, however these vessels remain patent on this non CTA examination. No retroperitoneal, mesenteric, pelvic or inguinal lymphadenopathy.  Normal diminutive appearance of the pelvic organs for age. No discrete adnexal lesion. No free fluid within the pelvis.  Asymmetric degenerate changes of the left SI joint. There is a serpiginous lucency through the posterior aspect of the left ilium which extends to the left SI joint (axial image 93, series 3) which may represent an age-indeterminate though presumably chronic nondisplaced fracture. There is approximately 9 mm of anterolisthesis of L4 upon L5 with associated severe DDD at this level.  IMPRESSION: 1. Minimally displaced fracture involving the anterior lateral aspect of the right 7th ribs with associated trace right-sided pneumothorax. No pleural effusion. 2. Asymmetric degenerative change of the left SI joint with associated serpiginous lucency involving the posterior aspect of the left ilium extending to the left SI joint, favored to represent a chronic nondisplaced fracture. Correlation for point tenderness at this location is recommended. Otherwise, no acute findings within the abdomen or pelvis. 3. Extensive coronary artery calcifications. Atherosclerotic plaque within a normal caliber thoracic aorta with possible hemodynamically significant narrowing involving the left subclavian artery. 4. Extensive atherosclerotic plaque within a normal caliber abdominal aorta. Atherosclerotic plaque extends to involve nearly all the major branch vessels of the abdominal aorta though the major branch vessels appear patent on this non CTA examination. 5. Incidental note made of an approximately 1.6 cm hypoattenuating nodule within the right lobe of the thyroid. Further evaluation with dedicated nonemergent  thyroid ultrasound may be performed as clinically indicated. 6. Colonic diverticulosis without evidence of diverticulitis.   Electronically Signed   By: Simonne Come M.D.   On: 08/04/2013 19:54   Dg Chest Port 1 View  08/05/2013   CLINICAL DATA:  Right rib fracture and pneumothorax.  EXAM: PORTABLE CHEST - 1 VIEW  COMPARISON:  CT, 08/04/2013  FINDINGS: No radiographic evidence of a pneumothorax. The anterior right rib fracture is not resolved.  Reticular lung base opacities are noted consistent with atelectasis. No evidence of a pulmonary contusion or infiltrate. No pleural effusion is seen.  Cardiac silhouette is normal in size. Mediastinum is normal in contour.  IMPRESSION: 1. No pneumothorax seen radiographically. Right-sided rib fracture is also not resolved on the portable AP view. 2. Mild lung base atelectasis.   Electronically Signed   By: Amie Portland M.D.   On: 08/05/2013 07:18    Anti-infectives: Anti-infectives   None       Assessment/Plan  1. Right rib fracture Patient Active Problem List   Diagnosis Date Noted  . Right rib fracture 08/04/2013  . Pneumothorax, right 08/04/2013  . Scalp abrasion 08/04/2013  . Spinal stenosis of lumbar region 05/26/2013  . Chronic systolic heart failure 03/21/2012  . Ischemic cardiomyopathy 03/21/2012  . CKD (chronic kidney disease) 03/21/2012  . UTI (lower urinary tract infection) 03/13/2012  . Acute respiratory failure with hypoxia 03/12/2012  . Pulmonary edema 03/12/2012  . NSTEMI (non-ST elevated myocardial infarction) 03/12/2012  . Pneumonia 03/12/2012  . Tachycardia 03/04/2011  . Dyslipidemia 12/10/2010  . CORONARY ATHEROSCLEROSIS NATIVE CORONARY ARTERY 08/27/2010  .  HYPERTENSION 08/26/2010  . CAROTID STENOSIS 08/26/2010   Plan: 1. PT OT should see the patient today for discharge planning. 2. Her pain is well controlled with her pain medications 3. Hopefully she can be dc home later today pending PT/OT recs. 4. tx the floor if  bed available   LOS: 2 days    Nelva Hauk E 08/06/2013, 7:40 AM Pager: [phone]

## 2013-08-06 NOTE — Progress Notes (Signed)
Got up with PT. Some dizziness and nausea likely representing post-concussive symptoms. I spoke to her daughter. May need some rehab prior to going home. Patient examined and I agree with the assessment and plan  Violeta Gelinas, MD, MPH, FACS Pager: [phone]  08/06/2013 1:12 PM

## 2013-08-06 NOTE — Evaluation (Signed)
Physical Therapy Evaluation Patient Details Name: Lori [name] MRN: [id number] DOB: [birth year] Today's Date: 08/06/2013 Time: 9147-8295 PT Time Calculation (min): 37 min  PT Assessment / Plan / Recommendation History of Present Illness  patient was walking up some steps when she fell over the railing onto the ground. She landed on her right side.She struck her head on the side of the house on the way down but had no loss of consciousness. She had significant right chest pain after falling.she was evaluated in the emergency department. She was not a trauma code activation. She was found to have a right seventh rib fracture and trace pneumothorax. I was asked to see her for admission to the trauma service. She continues to have some right rib pain exacerbated by deep breaths.  Clinical Impression  Pt admitted with above. Pt currently with functional limitations due to the deficits listed below (see PT Problem List).  Pt will benefit from skilled PT to increase their independence and safety with mobility to allow discharge to the venue listed below.       PT Assessment  Patient needs continued PT services    Follow Up Recommendations  Home health PT;Supervision/Assistance - 24 hour;Other (comment) (24 hour assist will be optimal for dc home); Would also rec for home, HHPT/OT/Aide, and HHSW follow-up  It is time to consider longer-term living situation for [name] and her husband with a higher level of assist for them -- need to look at ALF options versus Independent living apt options, etc  Worth considering post-acute inpatient rehab, though it is likely pt will decline    Does the patient have the potential to tolerate intense rehabilitation      Barriers to Discharge Decreased caregiver support This therapist has concerns about [name] and her husband's ability to manage safely in the home; Still, it is likely that she will refuse further inpatient rehab prior to going home  (and I'm unsure how her husband is managing); Pt indicated she has less help from daughter and granddaughter than originally thought (from last admission in Sept, it is documented that daughter and granddaughter can give 24 hour assist)    Equipment Recommendations  3in1 (PT);Other (comment) (does pt have tub bench?)    Recommendations for Other Services Other (comment) (SW)   Frequency Min 5X/week    Precautions / Restrictions Precautions Precautions: Fall   Pertinent Vitals/Pain Hd received pain meds prior to session      Mobility  Bed Mobility Bed Mobility: Supine to Sit;Sitting - Scoot to Edge of Bed Supine to Sit: 4: Min guard;HOB elevated;With rails Sitting - Scoot to Edge of Bed: 4: Min guard;With rail Details for Bed Mobility Assistance: Moving slowly secondary to R rib pain Transfers Transfers: Sit to Stand;Stand to Sit Sit to Stand: 4: Min guard;From bed;From toilet;With upper extremity assist Stand to Sit: 4: Min guard;To chair/3-in-1;With upper extremity assist;With armrests Details for Transfer Assistance: Noted dependence on UE push for successful sit to stand, and to control descent with stand to sit; Moves slowly, with grimace, which pt attributes to effort, not pain Ambulation/Gait Ambulation/Gait Assistance: 4: Min assist Ambulation Distance (Feet): 22 Feet Assistive device: Rolling walker Ambulation/Gait Assistance Details: Min assist mostly for RW management, moving around pbstacle as when RW is impeded, pt impulsively and quickly moves it, putting herself at greater risk of falling; Noted dependence on UE support Gait Pattern: Decreased stride length    Exercises     PT Diagnosis: Difficulty walking;Acute pain;Generalized  weakness  PT Problem List: Decreased strength;Decreased activity tolerance;Decreased balance;Decreased mobility;Decreased coordination;Decreased knowledge of use of DME;Decreased safety awareness;Pain PT Treatment Interventions: DME  instruction;Gait training;Stair training;Functional mobility training;Therapeutic activities;Therapeutic exercise;Balance training;Neuromuscular re-education;Cognitive remediation;Patient/family education     PT Goals(Current goals can be found in the care plan section) Acute Rehab PT Goals Patient Stated Goal: home PT Goal Formulation: With patient Time For Goal Achievement: 08/20/13 Potential to Achieve Goals: Good Additional Goals Additional Goal #1: Pt will score greater than or equal to 49/56 on Berg Balance Assessment  Visit Information  Last PT Received On: 08/06/13 Assistance Needed: +1 History of Present Illness: patient was walking up some steps when she fell over the railing onto the ground. She landed on her right side.She struck her head on the side of the house on the way down but had no loss of consciousness. She had significant right chest pain after falling.she was evaluated in the emergency department. She was not a trauma code activation. She was found to have a right seventh rib fracture and trace pneumothorax. I was asked to see her for admission to the trauma service. She continues to have some right rib pain exacerbated by deep breaths.       Prior Functioning  Home Living Family/patient expects to be discharged to:: Private residence Living Arrangements: Spouse/significant other Available Help at Discharge: Family;Available PRN/intermittently;Other (Comment) (Pt indicates they are not available 24 hours) Type of Home: House Home Access: Stairs to enter Entrance Stairs-Number of Steps: 2 Entrance Stairs-Rails: None Home Layout: One level Home Equipment: Walker - standard;Bedside commode;Cane - single point Prior Function Comments: Pt is having continued concerns about Ms. Milius's ability to manage at home, given recent admission for lumbar injections in Sept, and now with fall; Pt reports she still drives, and is independent with cane prior to this  fall Communication Communication: HOH Dominant Hand: Right    Cognition  Cognition Arousal/Alertness: Awake/alert Behavior During Therapy: WFL for tasks assessed/performed;Impulsive Overall Cognitive Status: Within Functional Limits for tasks assessed    Extremity/Trunk Assessment Upper Extremity Assessment Upper Extremity Assessment: Defer to OT evaluation Lower Extremity Assessment Lower Extremity Assessment: Generalized weakness (Noted dependence on UEs for stand<>sit)   Balance Balance Balance Assessed: Yes Static Standing Balance Static Standing - Balance Support: Right upper extremity supported;Left upper extremity supported Static Standing - Level of Assistance: 5: Stand by assistance  End of Session PT - End of Session Activity Tolerance: Patient tolerated treatment well Patient left: in chair;with call bell/phone within reach Nurse Communication: Mobility status  GP     [name] [facility] [place], [place] [phone]  08/06/2013, 10:24 AM

## 2013-08-07 ENCOUNTER — Inpatient Hospital Stay (HOSPITAL_COMMUNITY): Payer: Medicare Other

## 2013-08-07 DIAGNOSIS — IMO0002 Reserved for concepts with insufficient information to code with codable children: Secondary | ICD-10-CM | POA: Diagnosis not present

## 2013-08-07 DIAGNOSIS — W19XXXA Unspecified fall, initial encounter: Secondary | ICD-10-CM

## 2013-08-07 DIAGNOSIS — S2239XA Fracture of one rib, unspecified side, initial encounter for closed fracture: Secondary | ICD-10-CM | POA: Diagnosis not present

## 2013-08-07 DIAGNOSIS — S270XXA Traumatic pneumothorax, initial encounter: Secondary | ICD-10-CM | POA: Diagnosis not present

## 2013-08-07 DIAGNOSIS — K560 Paralytic ileus: Secondary | ICD-10-CM | POA: Diagnosis not present

## 2013-08-07 DIAGNOSIS — K6389 Other specified diseases of intestine: Secondary | ICD-10-CM | POA: Diagnosis not present

## 2013-08-07 DIAGNOSIS — J9383 Other pneumothorax: Secondary | ICD-10-CM | POA: Diagnosis not present

## 2013-08-07 DIAGNOSIS — I509 Heart failure, unspecified: Secondary | ICD-10-CM | POA: Diagnosis not present

## 2013-08-07 DIAGNOSIS — R112 Nausea with vomiting, unspecified: Secondary | ICD-10-CM | POA: Diagnosis not present

## 2013-08-07 LAB — BASIC METABOLIC PANEL
BUN: 12 mg/dL (ref 6–23)
CO2: 28 mEq/L (ref 19–32)
Calcium: 8.5 mg/dL (ref 8.4–10.5)
Chloride: 94 mEq/L — ABNORMAL LOW (ref 96–112)
GFR calc non Af Amer: 46 mL/min — ABNORMAL LOW (ref >90)
Glucose, Bld: 114 mg/dL — ABNORMAL HIGH (ref 70–99)
Potassium: 4.6 mEq/L (ref 3.5–5.1)

## 2013-08-07 MED ORDER — TRAMADOL HCL 50 MG PO TABS
50.0000 mg | ORAL_TABLET | Freq: Four times a day (QID) | ORAL | Status: DC | PRN
  Administered 2013-08-08 – 2013-08-11 (×6): 50 mg via ORAL
  Filled 2013-08-07 (×6): qty 1

## 2013-08-07 MED ORDER — ENOXAPARIN SODIUM 40 MG/0.4ML ~~LOC~~ SOLN
40.0000 mg | SUBCUTANEOUS | Status: DC
  Administered 2013-08-07 – 2013-08-10 (×4): 40 mg via SUBCUTANEOUS
  Filled 2013-08-07 (×5): qty 0.4

## 2013-08-07 MED ORDER — SODIUM CHLORIDE 0.9 % IJ SOLN: 3.0000 mL | INTRAMUSCULAR | Status: DC | PRN

## 2013-08-07 MED ORDER — MORPHINE SULFATE 2 MG/ML IJ SOLN: 2.0000 mg | INTRAMUSCULAR | Status: DC | PRN

## 2013-08-07 MED ORDER — DOCUSATE SODIUM 100 MG PO CAPS
100.0000 mg | ORAL_CAPSULE | Freq: Two times a day (BID) | ORAL | Status: DC
  Administered 2013-08-07: 100 mg via ORAL
  Filled 2013-08-07 (×4): qty 1

## 2013-08-07 MED ORDER — BISACODYL 10 MG RE SUPP
10.0000 mg | Freq: Every day | RECTAL | Status: DC
  Administered 2013-08-07 – 2013-08-09 (×3): 10 mg via RECTAL
  Filled 2013-08-07 (×3): qty 1

## 2013-08-07 NOTE — Clinical Social Work Note (Signed)
Clinical Social Work Department BRIEF PSYCHOSOCIAL ASSESSMENT 08/07/2013  Patient:  Lori [name], Lori [name]     Account Number:  [id number]     Admit date:  08/04/2013  Clinical Social Worker:  Verl Blalock  Date/Time:  08/07/2013 03:30 PM  Referred by:  Physician  Date Referred:  08/07/2013 Referred for  Psychosocial assessment   Other Referral:   Interview type:  Patient Other interview type:   Patient husband and daughter at the bedside    PSYCHOSOCIAL DATA Living Status:  HUSBAND Admitted from facility:   Level of care:   Primary support name:  Lori, [name]  [phone] Primary support relationship to patient:  SPOUSE Degree of support available:   Strong - very hard of hearing    CURRENT CONCERNS Current Concerns  None Noted   Other Concerns:    SOCIAL WORK ASSESSMENT / PLAN Clinical Social Worker met with patient, patient husband, and patient daughter at bedside to offer support and discuss patient needs at discharge.  Patient states that she was out in the yard looking at her flowers when she walked up the front steps, reaching for the metal handrail and missed.  Patient fell backwards in between porch and shrubbery.  Patient states that she is always very careful and usually uses the back door with the grab bar.  Per PT/OT patient can return home with home health.  Patient states that she has had [facility] in the past and would like to have them again.  CSW recommending a home health social worker to explore patient home safety. Patient daughter plans to provide patient and husband with transportation at discharge.    Clinical Social Worker inquired about current substance use.  Patient states that she has never drank any alcohol or used any drugs.  Patient does not have concerns regarding any use at this time.  SBIRT complete and no resources needed.  CSW signing off at this time.  Please reconsult if further needs arise prior to discharge.   Assessment/plan  status:  No Further Intervention Required Other assessment/ plan:   Information/referral to community resources:   Clinical Social Worker to make arrangements with [facility] regarding home health social work for patient and patient husband safety.  Patient unable to identify further resources at this time.    PATIENT'S/FAMILY'S RESPONSE TO PLAN OF CARE: Patient alert and oriented x3 laying in bed.  Patient with a very good sense of humor and willing to engage in assessment process.  Patient and patient daughter agreeable with patient plans to return home with [facility] - CSW to update RNCM.  Patient and family understanding of social work role and appreciative of support and concern.

## 2013-08-07 NOTE — Progress Notes (Addendum)
Called to 5N30 to assess patient with chest pain.  On arrival patient supine in bed.  Warm and dry.  Alert and oriented.  Now pain free.  Patient states she had sharp pain right side that migrated to midsternal area after walking with PT.  She states that is the most activity she has had since admission.  Color good.  No SOB or diaphoresis reported.  No nausea.  No palpitations.  Bil BS = and clear.  Patient admitted with rib rx right side.  12 lead without acute changes.  Farris Has PA notified prior to my arrival - to see patient.  After review of MAR - RN Ivar Drape encouraged to try to get patient's Imdur as she refused it this AM.   RN Ivar Drape to call as needed.

## 2013-08-07 NOTE — Evaluation (Signed)
Occupational Therapy Evaluation Patient Details Name: Lori [name] MRN: [id number] DOB: September 15, 1919 Today's Date: 08/07/2013 Time: 4098-1191 OT Time Calculation (min): 29 min  OT Assessment / Plan / Recommendation History of present illness Patient was walking up some steps when she fell over the railing onto the ground. She landed on her right side.She struck her head on the side of the house on the way down but had no loss of consciousness. She had significant right chest pain after falling.she was evaluated in the emergency department. She was not a trauma code activation. She was found to have a right seventh rib fracture and trace pneumothorax. I was asked to see her for admission to the trauma service. She continues to have some right rib pain exacerbated by deep breaths.   Clinical Impression   Pt presents w/ dx as above and currently demonstrates decreased ability to perform ADL's, functional mobility (see OT problems listed below) at PLOF. Pt will benefit from acute OT to address deficits & assist with increasing independence prior to d/c to next venue. If pt does not have 24/assist, she may need SNF Rehab.    OT Assessment  Patient needs continued OT Services    Follow Up Recommendations  Home health OT;Supervision/Assistance - 24 hour    Barriers to Discharge Other (comment) Pt's husband is 59 y/o & pt 79. Will need asssit at d/c  Equipment Recommendations  Tub/shower seat    Recommendations for Other Services    Frequency  Min 2X/week    Precautions / Restrictions Precautions Precautions: Fall   Pertinent Vitals/Pain R rib pain, not rated, RN aware. Pt repositioned and resting.    ADL  Grooming: Performed;Wash/dry hands;Wash/dry face;Brushing hair;Supervision/safety (Standing at sink in bathroom) Where Assessed - Grooming: Supported standing Upper Body Bathing: Simulated;Set up;Min guard Where Assessed - Upper Body Bathing: Supported sitting;Unsupported  sitting Lower Body Bathing: Simulated;Min guard;Set up Where Assessed - Lower Body Bathing: Supported sit to stand Upper Body Dressing: Simulated;Set up;Supervision/safety (Increased time) Where Assessed - Upper Body Dressing: Supported sitting;Unsupported sitting Lower Body Dressing: Simulated;Min guard;Set up Where Assessed - Lower Body Dressing: Supported sit to stand Toilet Transfer: Performed;Min guard (Ambulating into bathroom) Toilet Transfer Method: Sit to Barista: Comfort height toilet;Grab bars;Other (comment) (VC's for RW use) Toileting - Clothing Manipulation and Hygiene: Performed;Supervision/safety Where Assessed - Toileting Clothing Manipulation and Hygiene: Standing;Sit to stand from 3-in-1 or toilet Tub/Shower Transfer Method: Not assessed Equipment Used: Gait belt;Rolling walker Transfers/Ambulation Related to ADLs: Pt overall supervision level bed mobility and sit to stand. Supervision for functional mobility and Min guard for toilet transfer w/ VC's for safety, hand placement and sequencing w/ RW. ADL Comments: Pt participated in grooming activities standing at sink today as well as toilet transfer. She is independent and prefers to do things her own way, however benefits from vc's/assist for safety. Pt/grand daughter educated in role of OT as well as recommendations for initial HHOT  & 24//hr assist PRN. Note pt's husband is two years older ? his ability to assist, grand daughter states that she lives "Two doors down."    OT Diagnosis: Generalized weakness;Acute pain  OT Problem List: Impaired UE functional use;Decreased knowledge of precautions;Decreased knowledge of use of DME or AE;Pain;Decreased activity tolerance;Decreased strength OT Treatment Interventions: Self-care/ADL training;Therapeutic exercise;DME and/or AE instruction;Patient/family education;Therapeutic activities   OT Goals(Current goals can be found in the care plan section) Acute  Rehab OT Goals Patient Stated Goal: home OT Goal Formulation: With patient/family Time For  Goal Achievement: 08/21/13 Potential to Achieve Goals: Good  Visit Information  Last OT Received On: 08/07/13 Assistance Needed: +1 History of Present Illness: patient was walking up some steps when she fell over the railing onto the ground. She landed on her right side.She struck her head on the side of the house on the way down but had no loss of consciousness. She had significant right chest pain after falling.she was evaluated in the emergency department. She was not a trauma code activation. She was found to have a right seventh rib fracture and trace pneumothorax. I was asked to see her for admission to the trauma service. She continues to have some right rib pain exacerbated by deep breaths.       Prior Functioning     Home Living Family/patient expects to be discharged to:: Private residence Living Arrangements: Spouse/significant other Available Help at Discharge: Family;Available PRN/intermittently;Other (Comment) Type of Home: House Home Access: Stairs to enter [employer] of Steps: 2 Entrance Stairs-Rails: None Home Layout: One level Home Equipment: Walker - standard;Bedside commode;Cane - single point;Grab bars - tub/shower Additional Comments: pt would benefit from tub bench Prior Function Level of Independence: Independent with assistive device(s) Gait / Transfers Assistance Needed: Pt reports that she ambulates independently w/ SPC, standard walker and or reaching out for objects around the house ADL's / Homemaking Assistance Needed: Pt reports Independence bathing/dressing, 77 y/o husband assists w/ cleaning, driving Communication / Swallowing Assistance Needed: no assist needed. Communication Communication: HOH Dominant Hand: Right    Vision/Perception Vision - History Baseline Vision: Wears glasses only for reading Patient Visual Report: No change from  baseline   Cognition  Cognition Arousal/Alertness: Awake/alert Behavior During Therapy: WFL for tasks assessed/performed;Impulsive Overall Cognitive Status: Within Functional Limits for tasks assessed    Extremity/Trunk Assessment Upper Extremity Assessment Upper Extremity Assessment: RUE deficits/detail;Generalized weakness RUE Deficits / Details: AROM WFL's, however, pt moves very slowly secondary to pain, ribs. Grip strength WFL's, additional strength testing deferred secondary to rib fx Lower Extremity Assessment Lower Extremity Assessment: Generalized weakness;Defer to PT evaluation    Mobility Bed Mobility Bed Mobility: Supine to Sit;Sitting - Scoot to Edge of Bed Supine to Sit: 5: Supervision;HOB elevated Sitting - Scoot to [place] of Bed: 5: Supervision Details for Bed Mobility Assistance: Increased time, moving slowly secondary to R rib pain Transfers Transfers: Sit to Stand;Stand to Sit Sit to Stand: 5: Supervision;From bed;From toilet;With upper extremity assist;From chair/3-in-1 Stand to Sit: 4: Min guard;With upper extremity assist;To chair/3-in-1;To toilet Details for Transfer Assistance: VC's for safety, hand placement and sequencing w/ RW. Pt tends to reach out for objects in room to steady herself.        Balance Balance Balance Assessed: Yes Static Standing Balance Static Standing - Balance Support: Right upper extremity supported;Left upper extremity supported Static Standing - Level of Assistance: 5: Stand by assistance   End of Session OT - End of Session Equipment Utilized During Treatment: Gait belt;Rolling walker Activity Tolerance: Patient tolerated treatment well Patient left: in chair;with call bell/phone within reach;with nursing/sitter in room;with family/visitor present  GO     Roselie Awkward Dixon 08/07/2013, 10:19 AM

## 2013-08-07 NOTE — Progress Notes (Signed)
At 1410, pt complaining of tightness in her chest, and sharp pain in her right breast.  B/p 133/40, no shortness of breath, no diaphoresis.  EKG obtained, which showed no acute changes.  After EKG pt said tightness was better, but right breast pain still there.  Charma Igo, PA notified.  Pt took dose of imdur this evening.  Nsg to continue to monitor.

## 2013-08-07 NOTE — Progress Notes (Signed)
Physical Therapy Treatment Patient Details Name: Lori [name] MRN: [id number] DOB: 1919-04-20 Today's Date: 08/07/2013 Time: 4098-1191 PT Time Calculation (min): 30 min  PT Assessment / Plan / Recommendation  History of Present Illness patient was walking up some steps when she fell over the railing onto the ground. She landed on her right side.She struck her head on the side of the house on the way down but had no loss of consciousness. She had significant right chest pain after falling.she was evaluated in the emergency department. She was not a trauma code activation. She was found to have a right seventh rib fracture and trace pneumothorax. I was asked to see her for admission to the trauma service. She continues to have some right rib pain exacerbated by deep breaths.   PT Comments   Overall moving well, with much improved ambulation/RW management; Pt assured this therapist that she will accept family help at home;  Once back in bed, pt reported chest pain/tightness; RN called, vitals taken; See also previous Rapid Response note  Follow Up Recommendations  Home health PT;Supervision/Assistance - 24 hour     Does the patient have the potential to tolerate intense rehabilitation     Barriers to Discharge        Equipment Recommendations  3in1 (PT) (tub bench)    Recommendations for Other Services    Frequency Min 5X/week   Progress towards PT Goals Progress towards PT goals: Progressing toward goals  Plan Current plan remains appropriate    Precautions / Restrictions Precautions Precautions: Fall   Pertinent Vitals/Pain See vitals flow sheet.     Mobility  Bed Mobility Bed Mobility: Supine to Sit;Sitting - Scoot to Edge of Bed;Sit to Supine Supine to Sit: 5: Supervision;HOB elevated Sitting - Scoot to Edge of Bed: 5: Supervision Sit to Supine: 4: Min guard Details for Bed Mobility Assistance: Increased time, moving slowly secondary to R rib  pain Transfers Transfers: Sit to Stand;Stand to Sit Sit to Stand: 5: Supervision;From bed;From toilet;With upper extremity assist;From chair/3-in-1 Stand to Sit: With upper extremity assist;To chair/3-in-1;To toilet;4: Min guard Details for Transfer Assistance: VC's for safety, hand placement and sequencing w/ RW. Pt tends to reach out for objects in room to steady herself. Ambulation/Gait Ambulation/Gait Assistance: 4: Min guard;5: Supervision Ambulation Distance (Feet): 200 Feet Assistive device: Rolling walker Ambulation/Gait Assistance Details: Encouraged deep breathing and to self-monitor for activity tolerance    Exercises     PT Diagnosis:    PT Problem List:   PT Treatment Interventions:     PT Goals (current goals can now be found in the care plan section) Acute Rehab PT Goals Patient Stated Goal: home PT Goal Formulation: With patient Time For Goal Achievement: 08/20/13 Potential to Achieve Goals: Good  Visit Information  Last PT Received On: 08/07/13 Assistance Needed: +1 History of Present Illness: patient was walking up some steps when she fell over the railing onto the ground. She landed on her right side.She struck her head on the side of the house on the way down but had no loss of consciousness. She had significant right chest pain after falling.she was evaluated in the emergency department. She was not a trauma code activation. She was found to have a right seventh rib fracture and trace pneumothorax. I was asked to see her for admission to the trauma service. She continues to have some right rib pain exacerbated by deep breaths.    Subjective Data  Subjective: Hesitant to walk, but then  agreeable; After walk, pt with reported chest pain; called RN, vitals taken; Chest tightness and pain had subsided by the time this PT left room Patient Stated Goal: home   Cognition  Cognition Arousal/Alertness: Awake/alert Behavior During Therapy: [facility] for tasks  assessed/performed;Impulsive Overall Cognitive Status: Within Functional Limits for tasks assessed    Balance     End of Session PT - End of Session Activity Tolerance: Patient tolerated treatment well Patient left: in bed;with call bell/phone within reach Nurse Communication: Mobility status;Other (comment) (Pt with chest pain)   GP     [name] [facility] [place], [place] [phone]  08/07/2013, 4:29 PM

## 2013-08-07 NOTE — Progress Notes (Signed)
Patient ID: Lori [name], female   DOB: [birth year], 79 y.o.   MRN: [id number]   LOS: 3 days   Subjective: C/o nausea this morning. Began yesterday and she admits to some emesis yesterday. Denies abd pain.   Objective: Vital signs in last 24 hours: Temp:  [97.7 F (36.5 C)-98.8 F (37.1 C)] 98.8 F (37.1 C) (11/17 0600) Pulse Rate:  [79-87] 87 (11/17 0600) Resp:  [16-19] 18 (11/17 0600) BP: (117-172)/(42-71) 158/71 mmHg (11/17 0600) SpO2:  [93 %-97 %] 96 % (11/17 0600) Last BM Date: 08/04/13   IS:   Physical Exam General appearance: alert and mild distress Resp: clear to auscultation bilaterally Cardio: regular rate and rhythm GI: Moderate distension, tympanic BS, NT   Assessment/Plan: Fall Right rib fx -- Pulmonary toilet Ileus? -- Check abd x-ray, labs, UA Multiple medical problems -- Home meds FEN -- Will get swallow eval as recommended. Stick with non-narcotics for pain. VTE -- SCD's, start Lovenox Dispo -- Ileus    Freeman Caldron, PA-C Pager: [phone] General Trauma PA Pager: [phone]   08/07/2013

## 2013-08-07 NOTE — Evaluation (Signed)
Clinical/Bedside Swallow Evaluation Patient Details  Name: Lori [name] MRN: [id number] Date of Birth: 1919-06-28  Today's Date: 08/07/2013 Time: 7829-5621 SLP Time Calculation (min): 25 min  Past Medical History:  Past Medical History  Diagnosis Date  . Carotid artery stenosis     Left carotid endarterectomy 02/2001  . Hypertension   . Hyperlipidemia   . Thyroid disease     Euthyroid 02/2012  . CAD (coronary artery disease)     a. NSTEMI in 2011 s/p PCI/DES to distal Cx, s/p PCI/DES to OM2 (EF 40%). b. NSTEMI 02/2012 treated conservatively.  . Arthritis   . CHF (congestive heart failure)     a. EF 40% in 2011. b. EF 35-40% by echo 03/12/12.  [name] Kitchen Respiratory failure     02/2012 - requiring bipap - multifactorial in setting of PNA/CHF/NSTEMI  . Pneumonia     02/2012  . Hyponatremia     Noted on 02/2012 admission  . Renal insufficiency     Noted on 02/2012 admission   Past Surgical History:  Past Surgical History  Procedure Laterality Date  . Carotid endarterectomy      left 02/2001  . Cardiac catheterization     HPI:      Assessment / Plan / Recommendation Clinical Impression  Pt demonstrates normal oral and oropharyngeal function. She does report occasionally "getting strangled" at home if she is not careful, but has not had any pna, has never been on thickened liquids. Today pt does not exhibit any s/s of aspiration and observes basic aspiration precautions at baseline. She manages solids well but does report having some stomach upset recently. She was put on clear liquids per PA, but dtr and pt prefer liberalization of diet to be able to choose the foods and liquids she would like. Encouraged dtr to call service response to place orders. Discussed upgrade to regular/thin with Dr. Lindie Spruce. No SLP f/u needed.     Aspiration Risk  Mild    Diet Recommendation Regular;Thin liquid   Liquid Administration via: Cup;Straw Medication Administration: Whole meds with  liquid Supervision: Patient able to self feed Compensations: Slow rate;Small sips/bites Postural Changes and/or Swallow Maneuvers: Seated upright 90 degrees    Other  Recommendations Oral Care Recommendations: Oral care BID   Follow Up Recommendations  None    Frequency and Duration        Pertinent Vitals/Pain NA    SLP Swallow Goals     Swallow Study Prior Functional Status       General Type of Study: Bedside swallow evaluation Previous Swallow Assessment: none Diet Prior to this Study: Dysphagia 3 (soft);Nectar-thick liquids Temperature Spikes Noted: No Respiratory Status: Room air History of Recent Intubation: No Behavior/Cognition: Alert;Cooperative;Pleasant mood Oral Cavity - Dentition: Adequate natural dentition Self-Feeding Abilities: Able to feed self Patient Positioning: Upright in bed Baseline Vocal Quality: Clear Volitional Cough: Strong Volitional Swallow: Able to elicit    Oral/Motor/Sensory Function Overall Oral Motor/Sensory Function: Appears within functional limits for tasks assessed   Ice Chips Ice chips: Not tested   Thin Liquid Thin Liquid: Within functional limits Presentation: Cup;Straw;Self Fed    Nectar Thick Nectar Thick Liquid: Not tested   Honey Thick Honey Thick Liquid: Not tested   Puree Puree: Within functional limits   Solid   GO    Solid: Within functional limits      [facility], MA CCC-SLP [phone]  [name] 08/07/2013,9:22 AM

## 2013-08-07 NOTE — Progress Notes (Signed)
Patient rambunctious.  Had a bowel movement this AM.  I cancelled labs for this AM.  Possible home tomorrow if she continues to improve.    This patient has been seen and I agree with the findings and treatment plan.  Marta Lamas. Gae Bon, MD, FACS [phone] (pager) [phone] (direct pager) Trauma Surgeon

## 2013-08-08 DIAGNOSIS — R112 Nausea with vomiting, unspecified: Secondary | ICD-10-CM | POA: Diagnosis not present

## 2013-08-08 DIAGNOSIS — K560 Paralytic ileus: Secondary | ICD-10-CM | POA: Diagnosis not present

## 2013-08-08 DIAGNOSIS — IMO0002 Reserved for concepts with insufficient information to code with codable children: Secondary | ICD-10-CM | POA: Diagnosis not present

## 2013-08-08 DIAGNOSIS — S270XXA Traumatic pneumothorax, initial encounter: Secondary | ICD-10-CM | POA: Diagnosis not present

## 2013-08-08 DIAGNOSIS — S2239XA Fracture of one rib, unspecified side, initial encounter for closed fracture: Secondary | ICD-10-CM | POA: Diagnosis not present

## 2013-08-08 DIAGNOSIS — K567 Ileus, unspecified: Secondary | ICD-10-CM | POA: Diagnosis not present

## 2013-08-08 MED ORDER — MINERAL OIL RE ENEM
1.0000 | ENEMA | Freq: Once | RECTAL | Status: AC
  Administered 2013-08-08: 1 via RECTAL
  Filled 2013-08-08: qty 1

## 2013-08-08 MED ORDER — ERYTHROMYCIN BASE 250 MG PO TABS
500.0000 mg | ORAL_TABLET | Freq: Three times a day (TID) | ORAL | Status: DC
  Administered 2013-08-08 – 2013-08-11 (×11): 500 mg via ORAL
  Filled 2013-08-08 (×16): qty 2

## 2013-08-08 NOTE — Progress Notes (Signed)
Attempted to see pt this am. Pt c/o nausea and not feeling well.  Daughter requested to defer therapy at this time.  Pt has been walking to br with walker and S only and has been doing RUE exercises to tolerance.  Will check back as schedule allows. Tory Emerald, [place] [phone]

## 2013-08-08 NOTE — Progress Notes (Signed)
Physical Therapy Treatment Patient Details Name: Lori [name] MRN: [id number] DOB: 1919/06/03 Today's Date: 08/08/2013 Time: 4098-1191 PT Time Calculation (min): 23 min  PT Assessment / Plan / Recommendation  History of Present Illness patient was walking up some steps when she fell over the railing onto the ground. She landed on her right side.She struck her head on the side of the house on the way down but had no loss of consciousness. She had significant right chest pain after falling.she was evaluated in the emergency department. She was not a trauma code activation. She was found to have a right seventh rib fracture and trace pneumothorax. I was asked to see her for admission to the trauma service. She continues to have some right rib pain exacerbated by deep breaths.   PT Comments   Agreed to work despite not feeling well, but reported very tired; Able to manage 1 step well with grabbar -- Pt reports she will not use front steps (where she fell) for quite some time  Follow Up Recommendations  Home health PT;Supervision/Assistance - 24 hour     Does the patient have the potential to tolerate intense rehabilitation     Barriers to Discharge        Equipment Recommendations  3in1 (PT) (tub bench)    Recommendations for Other Services    Frequency Min 5X/week   Progress towards PT Goals Progress towards PT goals: Progressing toward goals  Plan Current plan remains appropriate    Precautions / Restrictions Precautions Precautions: Fall   Pertinent Vitals/Pain Rib pain; did not rate patient repositioned for comfort O2 sats greater than or equal to 97% entire session on room air    Mobility  Bed Mobility Bed Mobility: Sit to Supine Sit to Supine: 4: Min guard Details for Bed Mobility Assistance: Increased time, moving slowly secondary to R rib pain Transfers Transfers: Sit to Stand;Stand to Sit Sit to Stand: 5: Supervision;From toilet;With upper extremity assist;From  chair/3-in-1 Stand to Sit: With upper extremity assist;4: Min guard;To bed Details for Transfer Assistance: VC's for safety, hand placement and sequencing w/ RW. Ambulation/Gait Ambulation/Gait Assistance: 5: Supervision Ambulation Distance (Feet): 15 Feet Assistive device: Rolling walker Ambulation/Gait Assistance Details: Walked to /from bathroom with noted better RW maneuvering Stairs: Yes Stair Management Technique: Forwards;No rails (Grab bar) Number of Stairs: 1 (Used grab bar on pt's R side to approximate home)    Exercises     PT Diagnosis:    PT Problem List:   PT Treatment Interventions:     PT Goals (current goals can now be found in the care plan section) Acute Rehab PT Goals Patient Stated Goal: home PT Goal Formulation: With patient Time For Goal Achievement: 08/20/13 Potential to Achieve Goals: Good  Visit Information  Last PT Received On: 08/08/13 Assistance Needed: +1 History of Present Illness: patient was walking up some steps when she fell over the railing onto the ground. She landed on her right side.She struck her head on the side of the house on the way down but had no loss of consciousness. She had significant right chest pain after falling.she was evaluated in the emergency department. She was not a trauma code activation. She was found to have a right seventh rib fracture and trace pneumothorax. I was asked to see her for admission to the trauma service. She continues to have some right rib pain exacerbated by deep breaths.    Subjective Data  Subjective: Not wanting to walk as far today; reports  is tired Patient Stated Goal: home   Cognition  Cognition Arousal/Alertness: Awake/alert Behavior During Therapy: WFL for tasks assessed/performed;Impulsive Overall Cognitive Status: Within Functional Limits for tasks assessed    Balance  Standardized Balance Assessment Standardized Balance Assessment:  (Pt declined today)  End of Session PT - End of  Session Activity Tolerance: Patient tolerated treatment well Patient left: in bed;with call bell/phone within reach Nurse Communication: Mobility status   GP     [name] [facility] [place], [place] [phone]  08/08/2013, 1:03 PM

## 2013-08-08 NOTE — Progress Notes (Signed)
UR completed.  Isyss Espinal, RN BSN MHA CCM Trauma/Neuro ICU Case Manager [phone]  

## 2013-08-08 NOTE — Progress Notes (Signed)
Patient ID: Lori [name], female   DOB: 1919-03-05, 79 y.o.   MRN: [id number]   LOS: 4 days   Subjective: Continue to have nausea. Now having diarrhea.   Objective: Vital signs in last 24 hours: Temp:  [97.1 F (36.2 C)-98.7 F (37.1 C)] 98.7 F (37.1 C) (11/18 0617) Pulse Rate:  [67-85] 79 (11/18 0748) Resp:  [18-25] 18 (11/18 0617) BP: (127-150)/(40-99) 138/76 mmHg (11/18 0748) SpO2:  [96 %-99 %] 99 % (11/18 0617) Last BM Date: 08/07/13   IS: (+239ml)   Physical Exam General appearance: alert and no distress Resp: clear to auscultation bilaterally Cardio: regular rate and rhythm GI: Soft, tympanic normoactive BS, moderate distension worse than yesterday   Assessment/Plan: Fall  Right rib fx -- Pulmonary toilet  Ileus -- Will give EES to help motility Multiple medical problems -- Home meds  FEN -- SL IV as pt keeping liquids down VTE -- SCD's, Lovenox  Dispo -- Ileus    Freeman Caldron, PA-C Pager: [phone] General Trauma PA Pager: [phone]   08/08/2013

## 2013-08-08 NOTE — Progress Notes (Signed)
Still has a colonic ileus. Abdomen distended but soft, NT. Added enema earlier. Passing gas. Continue therapies. Patient examined and I agree with the assessment and plan  Violeta Gelinas, MD, MPH, FACS Pager: [phone]  08/08/2013 2:23 PM

## 2013-08-09 DIAGNOSIS — S270XXA Traumatic pneumothorax, initial encounter: Secondary | ICD-10-CM | POA: Diagnosis not present

## 2013-08-09 DIAGNOSIS — K560 Paralytic ileus: Secondary | ICD-10-CM

## 2013-08-09 DIAGNOSIS — IMO0002 Reserved for concepts with insufficient information to code with codable children: Secondary | ICD-10-CM | POA: Diagnosis not present

## 2013-08-09 DIAGNOSIS — S2239XA Fracture of one rib, unspecified side, initial encounter for closed fracture: Secondary | ICD-10-CM | POA: Diagnosis not present

## 2013-08-09 DIAGNOSIS — R112 Nausea with vomiting, unspecified: Secondary | ICD-10-CM | POA: Diagnosis not present

## 2013-08-09 NOTE — Progress Notes (Signed)
Occupational Therapy Treatment Patient Details Name: Lori [name] MRN: [id number] DOB: 26-Jun-1919 Today's Date: 08/09/2013 Time: 4098-1191 OT Time Calculation (min): 10 min  OT Assessment / Plan / Recommendation  History of present illness  Pt s/p fall with R rib fracture after fall   OT comments  Pt in recliner willing to work w/OT. Dtr present. Pt demo'd Mod (I) w/ADLs, fx'l mob and txfrs. She required inc time with tasks 2/2 R rib pain but did not require physical assist. Pt will have 24/7 support upon d/c. No follow-up OT indicated. Pt and husband in agreement.    Follow Up Recommendations  Supervision/Assistance - 24 hour;No OT follow up    Barriers to Discharge   none. Pt has necessary DME & family support    Equipment Recommendations    pt has needed equipment.   Recommendations for Other Services  none  Frequency   pt d/c from OT  Progress towards OT Goals Progress towards OT goals: Goals met and updated - see care plan  Plan Discharge plan remains appropriate;Frequency needs to be updated    Precautions / Restrictions Restrictions Weight Bearing Restrictions: No   Pertinent Vitals/Pain 7/10 R ribs    ADL  Grooming: Performed;Wash/dry hands;Wash/dry face;Teeth care;Supervision/safety Lower Body Dressing: Performed;Modified independent Where Assessed - Lower Body Dressing: Unsupported sitting Toilet Transfer: Modified independent Toilet Transfer Method: Sit to stand Toilet Transfer Equipment: Comfort height toilet;Grab bars;Other (comment) Toileting - Clothing Manipulation and Hygiene: Modified independent Where Assessed - Toileting Clothing Manipulation and Hygiene: Standing;Sit to stand from 3-in-1 or toilet Tub/Shower Transfer: Modified independent    OT Diagnosis:    OT Problem List:   OT Treatment Interventions:     OT Goals(current goals can now be found in the care plan section) Acute Rehab OT Goals Patient Stated Goal: home OT Goal Formulation:  With patient/family Time For Goal Achievement: 08/21/13 Potential to Achieve Goals: Good  Visit Information  Last OT Received On: 08/09/13 Assistance Needed: +1    Subjective Data      Prior Functioning       Cognition       Mobility  Transfers Transfers: Sit to Stand;Stand to Sit Sit to Stand: 6: Modified independent (Device/Increase time) Stand to Sit: 6: Modified independent (Device/Increase time)    Exercises      Balance     End of Session OT - End of Session Equipment Utilized During Treatment: Gait belt;Rolling walker Activity Tolerance: Patient tolerated treatment well Patient left: in chair;with call bell/phone within reach;with nursing/sitter in room;with family/visitor present  GO     Lori [name], De[place]re Ala 08/09/2013, 1:46 PM

## 2013-08-09 NOTE — Progress Notes (Signed)
Patient ID: Lori [name], female   DOB: 08/31/1919, 79 y.o.   MRN: [id number]   LOS: 5 days   Subjective: Still c/o bloating. Just received enema.   Objective: Vital signs in last 24 hours: Temp:  [97.6 F (36.4 C)-98.7 F (37.1 C)] 98.1 F (36.7 C) (11/19 0730) Pulse Rate:  [64-80] 80 (11/19 0730) Resp:  [18-19] 19 (11/19 0730) BP: (120-141)/(60-70) 141/61 mmHg (11/19 0730) SpO2:  [95 %-99 %] 95 % (11/19 0730) Last BM Date: 08/08/13   IS: (+24ml)   Physical Exam General appearance: alert and no distress Resp: rales right Cardio: regular rate and rhythm GI: Moderately distended, tympanic BS, minimally TTP   Assessment/Plan: Fall  Right rib fx -- Pulmonary toilet. IS improved but right rales are new. May check CXR if they persist. Ileus -- I suspect this is secondary to constipation though hard to say for sure. Patient and dtr report this may have started to be an issue shortly before her fall so may be coincidental. If Fleets enema unsuccessful this morning will repeat x-ray, may try larger volume enema. Multiple medical problems -- Home meds  FEN -- As above VTE -- SCD's, Lovenox  Dispo -- Ileus    Freeman Caldron, PA-C Pager: [phone] General Trauma PA Pager: [phone]   08/09/2013

## 2013-08-09 NOTE — Progress Notes (Signed)
Bowels still a problem, but patient had a liquid bowel movement today.  CPM, and will allow to go home when bowel lfunction has normalized.  This patient has been seen and I agree with the findings and treatment plan.  Marta Lamas. Gae Bon, MD, FACS [phone] (pager) [phone] (direct pager) Trauma Surgeon

## 2013-08-10 ENCOUNTER — Inpatient Hospital Stay (HOSPITAL_COMMUNITY): Payer: Medicare Other

## 2013-08-10 DIAGNOSIS — K560 Paralytic ileus: Secondary | ICD-10-CM | POA: Diagnosis not present

## 2013-08-10 DIAGNOSIS — S270XXA Traumatic pneumothorax, initial encounter: Secondary | ICD-10-CM | POA: Diagnosis not present

## 2013-08-10 DIAGNOSIS — R112 Nausea with vomiting, unspecified: Secondary | ICD-10-CM | POA: Diagnosis not present

## 2013-08-10 DIAGNOSIS — R141 Gas pain: Secondary | ICD-10-CM | POA: Diagnosis not present

## 2013-08-10 DIAGNOSIS — S2239XA Fracture of one rib, unspecified side, initial encounter for closed fracture: Secondary | ICD-10-CM | POA: Diagnosis not present

## 2013-08-10 DIAGNOSIS — IMO0002 Reserved for concepts with insufficient information to code with codable children: Secondary | ICD-10-CM | POA: Diagnosis not present

## 2013-08-10 MED ORDER — PEG 3350-KCL-NA BICARB-NACL 420 G PO SOLR
500.0000 mL | Freq: Once | ORAL | Status: AC
  Administered 2013-08-10: 500 mL via ORAL
  Filled 2013-08-10: qty 4000

## 2013-08-10 NOTE — Progress Notes (Signed)
Physical Therapy Treatment Patient Details Name: Lori [name] MRN: [id number] DOB: May 16, 1919 Today's Date: 08/10/2013 Time: 7829-5621 PT Time Calculation (min): 16 min  PT Assessment / Plan / Recommendation  History of Present Illness patient was walking up some steps when she fell over the railing onto the ground. She landed on her right side.She struck her head on the side of the house on the way down but had no loss of consciousness. She had significant right chest pain after falling.she was evaluated in the emergency department. She was not a trauma code activation. She was found to have a right seventh rib fracture and trace pneumothorax..   PT Comments   Pt. Making gradual progress with her mobility and RW.  Continue with POC.  Follow Up Recommendations        Does the patient have the potential to tolerate intense rehabilitation     Barriers to Discharge        Equipment Recommendations  3in1 (PT) (tub bench)    Recommendations for Other Services    Frequency Min 5X/week   Progress towards PT Goals Progress towards PT goals: Progressing toward goals  Plan Current plan remains appropriate    Precautions / Restrictions Precautions Precautions: Fall   Pertinent Vitals/Pain See vitals tab     Mobility  Bed Mobility Bed Mobility: Not assessed (pt. seated at EOB upon PT entry into room) Transfers Transfers: Sit to Stand;Stand to Sit Sit to Stand: 6: Modified independent (Device/Increase time);From bed;From chair/3-in-1 Stand to Sit: 4: Min guard;To chair/3-in-1 Details for Transfer Assistance: needed min guard assist for safe sit; vc's to control descent Ambulation/Gait Ambulation/Gait Assistance: 5: Supervision Ambulation Distance (Feet): 200 Feet Assistive device: Rolling walker Ambulation/Gait Assistance Details: min assist for safety and stability Gait Pattern: Decreased stride length;Trunk flexed Gait velocity: decreased    Exercises     PT Diagnosis:     PT Problem List:   PT Treatment Interventions:     PT Goals (current goals can now be found in the care plan section)    Visit Information  Last PT Received On: 08/10/13 Assistance Needed: +1 History of Present Illness: patient was walking up some steps when she fell over the railing onto the ground. She landed on her right side.She struck her head on the side of the house on the way down but had no loss of consciousness. She had significant right chest pain after falling.she was evaluated in the emergency department. She was not a trauma code activation. She was found to have a right seventh rib fracture and trace pneumothorax..    Subjective Data  Subjective: Wants to try to use the bathroom   Cognition  Cognition Arousal/Alertness: Awake/alert Behavior During Therapy: [facility] for tasks assessed/performed;Impulsive Overall Cognitive Status: Within Functional Limits for tasks assessed    Balance     End of Session PT - End of Session Equipment Utilized During Treatment: Other (comment) (pt. would not allow safety belt) Activity Tolerance: Patient tolerated treatment well Patient left: in chair;with call bell/phone within reach;with family/visitor present Nurse Communication: Mobility status   GP     Lori [name] 08/10/2013, 3:16 PM Weldon Picking PT Acute Rehab Services [phone] [place] [phone]

## 2013-08-10 NOTE — Progress Notes (Signed)
Patient evaluated for community based chronic disease management services with [facility] Care Management Program as a benefit of patient's [employer]. Spoke with patient and daughter at bedside to explain [facility] Care Management services.  Patient admitted on 11.14.14 for rib fractures due to a fall at home.  She has a stable hx of CHF and CAD.  She weighs daily at home, has a supportive daughter who lives near by.  He spouse, who is advanced in age also, may benefit from [facility] services also.  We will evaluate his needs as well.  [facility] will follow with HHPT and RN.  Her daughter will provide personal care assistance for showering.  Written consent for treatement obtained.  Daughter expressed that she does have some difficulty reaching Dr Christell Constant for appointments.  Provided [facility] 24/7 nurse line number to address this barrier.  Patient will receive a post discharge transition of care call and will be evaluated for monthly home visits for assessments and disease process education.  Left contact information and [facility] literature at bedside. Made Inpatient Case Manager aware that [facility] Care Management following. Of note, [facility] Care Management services does not replace or interfere with any services that are arranged by inpatient case management or social work.  For additional questions or referrals please contact Anibal Henderson BSN RN [facility] Liaison at [phone].

## 2013-08-10 NOTE — Progress Notes (Signed)
Patient ID: Lori [name], female   DOB: 01/19/1919, 79 y.o.   MRN: [id number]   LOS: 6 days   Subjective: Feels a little better. Ate this morning. Still only having watery BM's with enema. Doesn't feel bloated anymore.   Objective: Vital signs in last 24 hours: Temp:  [98 F (36.7 C)-98.9 F (37.2 C)] 98.9 F (37.2 C) (11/20 0510) Pulse Rate:  [88-95] 94 (11/20 0510) Resp:  [18] 18 (11/20 0510) BP: (118-160)/(47-64) 139/47 mmHg (11/20 0510) SpO2:  [99 %-100 %] 99 % (11/20 0510) Last BM Date: 08/09/17   IS: (=)   Physical Exam General appearance: alert and no distress Resp: clear to auscultation bilaterally Cardio: regular rate and rhythm GI: normal findings: bowel sounds normal, soft, non-tender and mild distension   Assessment/Plan: Fall  Right rib fx -- Pulmonary toilet.  Ileus -- Check abd x-ray Multiple medical problems -- Home meds  FEN -- As above  VTE -- SCD's, Lovenox  Dispo -- If x-ray improved will dc home. If not and stool burden still seems to be an issue will try larger volume enema    Freeman Caldron, PA-C Pager: [phone] General Trauma PA Pager: [phone]   08/10/2013

## 2013-08-10 NOTE — Progress Notes (Signed)
Trying golytely. Passing liquid BMs and gas but still quite distended.I spoke to her daughter. Patient examined and I agree with the assessment and plan  Violeta Gelinas, MD, MPH, FACS Pager: [phone]  08/10/2013 2:42 PM

## 2013-08-11 DIAGNOSIS — IMO0002 Reserved for concepts with insufficient information to code with codable children: Secondary | ICD-10-CM | POA: Diagnosis not present

## 2013-08-11 DIAGNOSIS — S2239XA Fracture of one rib, unspecified side, initial encounter for closed fracture: Secondary | ICD-10-CM | POA: Diagnosis not present

## 2013-08-11 DIAGNOSIS — K560 Paralytic ileus: Secondary | ICD-10-CM | POA: Diagnosis not present

## 2013-08-11 DIAGNOSIS — S270XXA Traumatic pneumothorax, initial encounter: Secondary | ICD-10-CM | POA: Diagnosis not present

## 2013-08-11 DIAGNOSIS — R112 Nausea with vomiting, unspecified: Secondary | ICD-10-CM | POA: Diagnosis not present

## 2013-08-11 MED ORDER — TRAMADOL HCL 50 MG PO TABS: 50.0000 mg | ORAL_TABLET | Freq: Four times a day (QID) | ORAL | Status: DC | PRN

## 2013-08-11 MED ORDER — POLYETHYLENE GLYCOL 3350 17 GM/SCOOP PO POWD: 17.0000 g | Freq: Every day | ORAL | Status: DC

## 2013-08-11 NOTE — Progress Notes (Signed)
At shift change, 7pm, pt had drank not quite half of the GoLytely but pt was unable to drink anymore. Pt stated it made her nauseous and sick. Pt has had several large loose, watery stools. Abd remains distended, hypoactive BS, passing flatus. Pt states her abd feels better, no c/o nausea except when she drinks the GoLytely. Will continue to monitor.

## 2013-08-11 NOTE — Discharge Summary (Signed)
Physician Discharge Summary  Patient ID: Lori [name] MRN: [id number] DOB/AGE: 01-31-1919 79 y.o.  Admit date: 08/04/2013 Discharge date: 08/11/2013  Discharge Diagnoses Patient Active Problem List   Diagnosis Date Noted  . Ileus 08/08/2013  . Fall 08/07/2013  . Right rib fracture 08/04/2013  . Pneumothorax, right 08/04/2013  . Scalp abrasion 08/04/2013  . Spinal stenosis of lumbar region 05/26/2013  . Chronic systolic heart failure 03/21/2012  . Ischemic cardiomyopathy 03/21/2012  . CKD (chronic kidney disease) 03/21/2012  . UTI (lower urinary tract infection) 03/13/2012  . Acute respiratory failure with hypoxia 03/12/2012  . Pulmonary edema 03/12/2012  . NSTEMI (non-ST elevated myocardial infarction) 03/12/2012  . Pneumonia 03/12/2012  . Tachycardia 03/04/2011  . Dyslipidemia 12/10/2010  . CORONARY ATHEROSCLEROSIS NATIVE CORONARY ARTERY 08/27/2010  . HYPERTENSION 08/26/2010  . CAROTID STENOSIS 08/26/2010    Consultants none  Procedures none  HPI: The patient was walking up some steps when she fell over the railing onto the ground. She landed on her right side.She struck her head on the side of the house on the way down but had no loss of consciousness. She had significant right chest pain after falling. She was evaluated in the emergency department. She was not a trauma code activation. She was found to have a right seventh rib fracture and trace pneumothorax. The trauma team was asked to see the patient for admission to the trauma service.    Hospital Course:  Ms. Lori [name] is a 79 year old female who presented to [facility] and was admitted to the trauma service with right seventh rib fracture and a trace pneumothorax. Patient had several CT scans which showed the right rib fracture and trace pneumothorax.  On 08/07/2013 the patient began to complain of nausea and vomiting for which an abdominal x-ray was ordered which showed colonic distention most compatible with an  ileus. An enema was ordered to correct the ileus with minimal resolution. A repeat abdominal x-ray was done on 11/20 which showed persistent diffuse colonic distention thought to be due to an ileus. Patient was started on GoLytely which seemed to help. Diet was advanced as tolerated.  On HD #8, the patient was voiding well, tolerating diet, ambulating well, pain well controlled, vital signs stable, had no nausea or vomiting and felt stable for discharge home.  Patient knows to call with questions or concerns.      Medication List         amLODipine-olmesartan 5-20 MG per tablet  Commonly known as:  AZOR  Take 1 tablet by mouth daily as needed.     aspirin 81 MG tablet  Take 81 mg by mouth daily.     carvedilol 3.125 MG tablet  Commonly known as:  COREG  TAKE ONE TABLET BY MOUTH TWICE A DAY WITH MEALS     clopidogrel 75 MG tablet  Commonly known as:  PLAVIX  Take 1 tablet (75 mg total) by mouth daily.     furosemide 40 MG tablet  Commonly known as:  LASIX  TAKE ONE TABLET BY MOUTH ONE TIME DAILY     isosorbide mononitrate 30 MG 24 hr tablet  Commonly known as:  IMDUR  TAKE ONE HALF TABLET BY MOUTH EVERY DAY     levothyroxine 50 MCG tablet  Commonly known as:  SYNTHROID, LEVOTHROID  Take 1 tablet (50 mcg total) by mouth daily before breakfast.     nitroGLYCERIN 0.4 MG SL tablet  Commonly known as:  NITROSTAT  Place 1 tablet (  0.4 mg total) under the tongue every 5 (five) minutes x 3 doses as needed for chest pain.     polyethylene glycol powder powder  Commonly known as:  GLYCOLAX/MIRALAX  Take 17 g by mouth daily.     pravastatin 40 MG tablet  Commonly known as:  PRAVACHOL  Take 1 tablet (40 mg total) by mouth daily.     traMADol 50 MG tablet  Commonly known as:  ULTRAM  Take 1-2 tablets (50-100 mg total) by mouth every 6 (six) hours as needed (Pain).         Follow-up Information   Call [facility]. (As needed)    Contact information:   [address] [place] [address] [phone]       Follow up with Rudi Heap, MD. Schedule an appointment as soon as possible for a visit in 1 month.   Specialty:  Family Medicine   Contact information:   [address] [place] [address] [phone]       Signed: Alysia Penna, PA-Student [facility]  Trauma Service [phone]  08/11/2013, 8:24 AM This patient has been seen and I agree with the findings and treatment plan.  Marta Lamas. Gae Bon, MD, FACS [phone] (pager) [phone] (direct pager) Trauma Surgeon

## 2013-08-11 NOTE — Progress Notes (Signed)
Patient ID: Lori [name], female   DOB: Sep 17, 1919, 79 y.o.   MRN: [id number]  LOS: 7 days   Subjective: Patient states she is feeling better this morning. Has had several bowel movements which she states are not as watery as yesterday. Ate breakfast this morning with no nausea.   Objective: Vital signs in last 24 hours: Temp:  [97.9 F (36.6 C)-98.4 F (36.9 C)] 98.1 F (36.7 C) (11/21 4098) Pulse Rate:  [71-90] 73 (11/21 0611) Resp:  [16-18] 18 (11/21 0611) BP: (106-166)/(52-93) 166/52 mmHg (11/21 0611) SpO2:  [96 %-100 %] 100 % (11/21 0611) Last BM Date: 08/10/13   Laboratory  CBC No results found for this basename: WBC, HGB, HCT, PLT,  in the last 72 hours BMET No results found for this basename: NA, K, CL, CO2, GLUCOSE, BUN, CREATININE, CALCIUM,  in the last 72 hours   Physical Exam General appearance: alert and no distress Resp: clear to auscultation bilaterally Cardio: regular rate and rhythm GI: mildly hypoactive bowel sounds, non-tender, mildly distended   Assessment/Plan: Fall  Right rib fx -- Pulmonary toilet.  Ileus -- Less symptomatic today  Multiple medical problems -- Home meds  FEN -- As above  VTE -- SCD's, Lovenox  Dispo --Possible discharge home today    [facility], PA-Student [employer]  08/11/2013

## 2013-08-11 NOTE — Progress Notes (Signed)
She is doing well enough to go home.  This patient has been seen and I agree with the findings and treatment plan.  Marta Lamas. Gae Bon, MD, FACS [phone] (pager) [phone] (direct pager) Trauma Surgeon

## 2013-08-11 NOTE — Progress Notes (Signed)
Physical Therapy Treatment Patient Details Name: BRITLEY GASHI MRN: [id number] DOB: 01-07-1919 Today's Date: 08/11/2013 Time: 4098-1191 PT Time Calculation (min): 18 min  PT Assessment / Plan / Recommendation  History of Present Illness patient was walking up some steps when she fell over the railing onto the ground. She landed on her right side.She struck her head on the side of the house on the way down but had no loss of consciousness. She had significant right chest pain after falling.she was evaluated in the emergency department. She was not a trauma code activation. She was found to have a right seventh rib fracture and trace pneumothorax..   PT Comments   Pt states she feels well today & she cont's to progress with mobility.  Completed stair training this session.  Pt eager to d/c home today.  Feel pt is safe to d/c home from mobility standpoint when MD feels medically ready.     Follow Up Recommendations  Home health PT     Does the patient have the potential to tolerate intense rehabilitation     Barriers to Discharge        Equipment Recommendations       Recommendations for Other Services    Frequency Min 5X/week   Progress towards PT Goals Progress towards PT goals: Progressing toward goals  Plan Current plan remains appropriate    Precautions / Restrictions Precautions Precautions: Fall Restrictions Weight Bearing Restrictions: No       Mobility  Bed Mobility Bed Mobility: Not assessed Transfers Transfers: Sit to Stand;Stand to Sit Sit to Stand: 6: Modified independent (Device/Increase time);With upper extremity assist;With armrests;From chair/3-in-1;From toilet Stand to Sit: 5: Supervision;With upper extremity assist;With armrests;To chair/3-in-1;To toilet Details for Transfer Assistance: cues for hand placement with stand>sit to recliner Ambulation/Gait Ambulation/Gait Assistance: 5: Supervision Ambulation Distance (Feet): 200 Feet Assistive device:  Rolling walker Ambulation/Gait Assistance Details: Cues for safe use of RW around obstacles.   Gait Pattern: Step-through pattern;Decreased stride length Stairs: Yes Stairs Assistance: 4: Min guard Stair Management Technique: One rail Right;Step to pattern;Forwards Number of Stairs: 2 (2x's) Wheelchair Mobility Wheelchair Mobility: No    Exercises General Exercises - Lower Extremity Long Arc Quad: AROM;Strengthening;Both;10 reps Hip Flexion/Marching: AROM;Strengthening;Both;10 reps Toe Raises: AROM;Both;10 reps Heel Raises: AROM;Both;10 reps   PT Diagnosis:    PT Problem List:   PT Treatment Interventions:     PT Goals (current goals can now be found in the care plan section) Acute Rehab PT Goals PT Goal Formulation: With patient Time For Goal Achievement: 08/20/13 Potential to Achieve Goals: Good  Visit Information  Last PT Received On: 08/11/13 Assistance Needed: +1 History of Present Illness: patient was walking up some steps when she fell over the railing onto the ground. She landed on her right side.She struck her head on the side of the house on the way down but had no loss of consciousness. She had significant right chest pain after falling.she was evaluated in the emergency department. She was not a trauma code activation. She was found to have a right seventh rib fracture and trace pneumothorax..    Subjective Data      Cognition  Cognition Arousal/Alertness: Awake/alert Behavior During Therapy: WFL for tasks assessed/performed;Impulsive Overall Cognitive Status: Within Functional Limits for tasks assessed    Balance     End of Session PT - End of Session Activity Tolerance: Patient tolerated treatment well Patient left: in chair;with call bell/phone within reach Nurse Communication: Mobility status   GP  Lara Mulch 08/11/2013, 9:03 AM  Verdell Face, PTA [phone] 08/11/2013

## 2013-08-13 DIAGNOSIS — Z9181 History of falling: Secondary | ICD-10-CM | POA: Diagnosis not present

## 2013-08-13 DIAGNOSIS — I129 Hypertensive chronic kidney disease with stage 1 through stage 4 chronic kidney disease, or unspecified chronic kidney disease: Secondary | ICD-10-CM | POA: Diagnosis not present

## 2013-08-13 DIAGNOSIS — I2510 Atherosclerotic heart disease of native coronary artery without angina pectoris: Secondary | ICD-10-CM | POA: Diagnosis not present

## 2013-08-13 DIAGNOSIS — IMO0001 Reserved for inherently not codable concepts without codable children: Secondary | ICD-10-CM | POA: Diagnosis not present

## 2013-08-13 DIAGNOSIS — M48061 Spinal stenosis, lumbar region without neurogenic claudication: Secondary | ICD-10-CM | POA: Diagnosis not present

## 2013-08-13 DIAGNOSIS — I509 Heart failure, unspecified: Secondary | ICD-10-CM | POA: Diagnosis not present

## 2013-08-13 DIAGNOSIS — N189 Chronic kidney disease, unspecified: Secondary | ICD-10-CM | POA: Diagnosis not present

## 2013-08-16 ENCOUNTER — Telehealth: Payer: Self-pay | Admitting: Family Medicine

## 2013-08-16 DIAGNOSIS — IMO0001 Reserved for inherently not codable concepts without codable children: Secondary | ICD-10-CM | POA: Diagnosis not present

## 2013-08-16 DIAGNOSIS — N189 Chronic kidney disease, unspecified: Secondary | ICD-10-CM | POA: Diagnosis not present

## 2013-08-16 DIAGNOSIS — M48061 Spinal stenosis, lumbar region without neurogenic claudication: Secondary | ICD-10-CM | POA: Diagnosis not present

## 2013-08-16 DIAGNOSIS — I129 Hypertensive chronic kidney disease with stage 1 through stage 4 chronic kidney disease, or unspecified chronic kidney disease: Secondary | ICD-10-CM | POA: Diagnosis not present

## 2013-08-16 DIAGNOSIS — I2510 Atherosclerotic heart disease of native coronary artery without angina pectoris: Secondary | ICD-10-CM | POA: Diagnosis not present

## 2013-08-16 DIAGNOSIS — I509 Heart failure, unspecified: Secondary | ICD-10-CM | POA: Diagnosis not present

## 2013-08-16 NOTE — Telephone Encounter (Signed)
Pt needs hospital follow up appt scheduled

## 2013-08-21 ENCOUNTER — Ambulatory Visit (INDEPENDENT_AMBULATORY_CARE_PROVIDER_SITE_OTHER): Payer: Medicare Other

## 2013-08-21 ENCOUNTER — Ambulatory Visit (INDEPENDENT_AMBULATORY_CARE_PROVIDER_SITE_OTHER): Payer: Medicare Other | Admitting: Family Medicine

## 2013-08-21 ENCOUNTER — Encounter: Payer: Self-pay | Admitting: Family Medicine

## 2013-08-21 VITALS — BP 101/51 | HR 82 | Temp 96.9°F | Ht 66.0 in | Wt 147.0 lb

## 2013-08-21 DIAGNOSIS — J9383 Other pneumothorax: Secondary | ICD-10-CM

## 2013-08-21 DIAGNOSIS — S2231XS Fracture of one rib, right side, sequela: Secondary | ICD-10-CM

## 2013-08-21 DIAGNOSIS — Z09 Encounter for follow-up examination after completed treatment for conditions other than malignant neoplasm: Secondary | ICD-10-CM

## 2013-08-21 DIAGNOSIS — IMO0002 Reserved for concepts with insufficient information to code with codable children: Secondary | ICD-10-CM | POA: Diagnosis not present

## 2013-08-21 DIAGNOSIS — J939 Pneumothorax, unspecified: Secondary | ICD-10-CM

## 2013-08-21 NOTE — Patient Instructions (Signed)
Pneumothorax  A pneumothorax is a collapsed lung. This is a condition that usually occurs suddenly. It happens when air begins leaking from a lung and begins to build up between the lung and the pleura (the thin lining around the lung on the inside of the rib cage). When this is small, there may be minimal pain, difficulty breathing, or other problems. Sometimes it can be followed without being admitted to the hospital. When it is larger, it may require hospitalization. A tube (called a chest tube) may need to be inserted into the air space. It will remove the air between the lung and the rib cage. This immediately expands the lung back to normal size and makes breathing easier. A chest tube is usually in for 1 to 7 days, although it may be longer.  CAUSES    It may happen spontaneously with no apparent reason.   It may happen because of previous lung disease or problems. Examples of this include:   Asthma.   Emphysema.   Injury   Rib fractures.   Blunt (from a car accident or a fall).   Sharp (from a knife or gunshot wound).  SYMPTOMS   This condition may be associated with:   Cough.   Chest pain.   Shortness of breath.   Increased rate of breathing.  HOME CARE INSTRUCTIONS    Only take over-the-counter or prescription medicines for pain, discomfort, or fever as directed by your caregiver.   Smoking is a common aggravating factor. Stop smoking.   If a cough or pain makes it difficult to sleep at night, try sleeping in a semi-upright position in a recliner or using 2 or 3 pillows may help.   Rest as you feel it is needed. Your body will help guide you in this.   If you had a chest tube and it was removed, ask your provider when it is okay to remove the dressing.   Do not fly in an airplane or scuba dive after being treated for a pneumothorax until your provider says it is okay.  If you have been sent home with a diagnosis of pneumothorax, it is small and your caregiver believes it will get better  without further treatment. However, your condition can change over time. You must monitor your condition carefully. If you have a significant change in how you feel, carefully follow the instructions below.  SEEK IMMEDIATE MEDICAL CARE IF:    You have increasing shortness of breath.   You cannot control your cough with suppressants and are losing sleep.   You begin coughing up blood.   You develop pain which is getting worse or is uncontrolled with medicines.   You have a fever.   You develop pus-like sputum.   Your symptoms which brought you initially in for care are getting worse rather than better or are not controlled with medicines.  MAKE SURE YOU:    Understand these instructions.   Will watch your condition.   Will get help right away if you are not doing well or get worse.  Document Released: 09/07/2005 Document Revised: 11/30/2011 Document Reviewed: 11/01/2008  Exit[employer] Patient Information 2014 [employer].

## 2013-08-21 NOTE — Progress Notes (Signed)
   Subjective:    Patient ID: Lori [name], female    DOB: 05-18-1919, 79 y.o.   MRN: [id number]  HPI Pt presents today for hospital follow up  Pt was admitted 11/14-11/21 for fall with R rib fracture and trace PTX.  Pt states that pain is gradually improving.  Pain well controlled with tramadol. No SOB.  No fevers or chills Pt also had ileus while hospitialized. Improved with golytely.  Pt denies any nausea, vomiting, abd pain.     Review of Systems  All other systems reviewed and are negative.       Objective:   Physical Exam  Constitutional: She appears well-developed and well-nourished.  HENT:  Head: Normocephalic and atraumatic.  Eyes: Conjunctivae are normal. Pupils are equal, round, and reactive to light.  Neck: Normal range of motion. Neck supple.  Cardiovascular: Normal rate and regular rhythm.   Pulmonary/Chest: Effort normal and breath sounds normal.    Abdominal: Soft.  Neurological: She is alert.  Skin: Skin is warm.     Dg Ribs Unilateral W/chest Right  08/21/2013   CLINICAL DATA:  Right rib fractures  EXAM: RIGHT RIBS AND CHEST - 3+ VIEW  COMPARISON:  None.  FINDINGS: There are mild displaced fractures of the lateral right 2nd, 3rd, 4th, 5th, 6th ribs in. There is a small right pleural effusion. There is no pneumothorax. There are degenerative changes of the spine  IMPRESSION: Multiple mild displaced right rib fractures. Small right pleural effusion.   Electronically Signed   By: Sherian Rein M.D.   On: 08/21/2013 12:40        Assessment & Plan:  Hospital discharge follow-up  Rib fracture, right, sequela  Pneumothorax  Clinically improving Minimal pain. Also improving with tramadol CXR and rib films reassuring today.  Continue with treatment regimen Return or go to hospital if pain worsens, fever, dyspnea or abd pain occurs.

## 2013-08-22 DIAGNOSIS — IMO0001 Reserved for inherently not codable concepts without codable children: Secondary | ICD-10-CM | POA: Diagnosis not present

## 2013-08-22 DIAGNOSIS — N189 Chronic kidney disease, unspecified: Secondary | ICD-10-CM | POA: Diagnosis not present

## 2013-08-22 DIAGNOSIS — I509 Heart failure, unspecified: Secondary | ICD-10-CM | POA: Diagnosis not present

## 2013-08-22 DIAGNOSIS — I2510 Atherosclerotic heart disease of native coronary artery without angina pectoris: Secondary | ICD-10-CM | POA: Diagnosis not present

## 2013-08-22 DIAGNOSIS — M48061 Spinal stenosis, lumbar region without neurogenic claudication: Secondary | ICD-10-CM | POA: Diagnosis not present

## 2013-08-22 DIAGNOSIS — I129 Hypertensive chronic kidney disease with stage 1 through stage 4 chronic kidney disease, or unspecified chronic kidney disease: Secondary | ICD-10-CM | POA: Diagnosis not present

## 2013-08-24 DIAGNOSIS — I129 Hypertensive chronic kidney disease with stage 1 through stage 4 chronic kidney disease, or unspecified chronic kidney disease: Secondary | ICD-10-CM | POA: Diagnosis not present

## 2013-08-24 DIAGNOSIS — N189 Chronic kidney disease, unspecified: Secondary | ICD-10-CM | POA: Diagnosis not present

## 2013-08-24 DIAGNOSIS — I509 Heart failure, unspecified: Secondary | ICD-10-CM | POA: Diagnosis not present

## 2013-08-24 DIAGNOSIS — M48061 Spinal stenosis, lumbar region without neurogenic claudication: Secondary | ICD-10-CM | POA: Diagnosis not present

## 2013-08-24 DIAGNOSIS — I2510 Atherosclerotic heart disease of native coronary artery without angina pectoris: Secondary | ICD-10-CM | POA: Diagnosis not present

## 2013-08-24 DIAGNOSIS — IMO0001 Reserved for inherently not codable concepts without codable children: Secondary | ICD-10-CM | POA: Diagnosis not present

## 2013-08-28 ENCOUNTER — Ambulatory Visit (INDEPENDENT_AMBULATORY_CARE_PROVIDER_SITE_OTHER): Payer: Medicare Other

## 2013-08-28 ENCOUNTER — Ambulatory Visit (INDEPENDENT_AMBULATORY_CARE_PROVIDER_SITE_OTHER): Payer: Medicare Other | Admitting: Family Medicine

## 2013-08-28 ENCOUNTER — Other Ambulatory Visit: Payer: Self-pay | Admitting: Family Medicine

## 2013-08-28 ENCOUNTER — Encounter: Payer: Self-pay | Admitting: Family Medicine

## 2013-08-28 VITALS — BP 127/59 | HR 86 | Temp 97.3°F | Ht 66.0 in | Wt 147.0 lb

## 2013-08-28 DIAGNOSIS — S2241XS Multiple fractures of ribs, right side, sequela: Secondary | ICD-10-CM

## 2013-08-28 DIAGNOSIS — IMO0002 Reserved for concepts with insufficient information to code with codable children: Secondary | ICD-10-CM | POA: Diagnosis not present

## 2013-08-28 DIAGNOSIS — E039 Hypothyroidism, unspecified: Secondary | ICD-10-CM | POA: Diagnosis not present

## 2013-08-28 NOTE — Progress Notes (Signed)
   Subjective:    Patient ID: Lori [name], female    DOB: [birth year], 79 y.o.   MRN: [id number]  HPI Pt presents today for general recheck  Was seen last week here for hospital follow up s/p fall with noted multiple R rib fractures and small R sided PTX.  Had follow up imaging showing stable R sided rib fractures and small r sided pleural effusion.  Pain has been fairly stable at home Well managed with tramadol  Does have some SOB, unchanged from last week No fevers or chills.     Review of Systems  All other systems reviewed and are negative.       Objective:   Physical Exam  Constitutional: She appears well-developed.  HENT:  Head: Normocephalic and atraumatic.  Eyes: Conjunctivae are normal. Pupils are equal, round, and reactive to light.  Neck: Normal range of motion.  Cardiovascular: Normal rate and regular rhythm.   Pulmonary/Chest:  Mild R sided TTP along R lateral ribcage.  No bruising or palpable deformity   Abdominal: Soft.  Musculoskeletal: Normal range of motion.  Neurological: She is alert.  Skin: Skin is warm.   [facility] reading (PRIMARY) by  Dr. Alvester Morin  Rib and CXR preliminarily unchanged from previous CXR last week.                                          Assessment & Plan:  Fracture of multiple ribs, right, sequela - Plan: DG Ribs Unilateral W/Chest Right  Unspecified hypothyroidism - Plan: TSH  Overall seems to be healing well  No significant acute changes on preliminary imaging.  Continue with tramadol.  Discussed general and MSK red flags at length.  Follow up in 1-2 weeks or sooner if sxs worsen.   Recheck TSH today.

## 2013-08-29 ENCOUNTER — Other Ambulatory Visit: Payer: Self-pay | Admitting: *Deleted

## 2013-08-29 LAB — TSH: TSH: 1.99 u[IU]/mL (ref 0.450–4.500)

## 2013-08-29 MED ORDER — LEVOTHYROXINE SODIUM 50 MCG PO TABS: 50.0000 ug | ORAL_TABLET | Freq: Every day | ORAL | Status: DC

## 2013-08-29 NOTE — Telephone Encounter (Signed)
TSH Normal. Thyroid medication refilled with no changes.

## 2013-08-30 DIAGNOSIS — I2510 Atherosclerotic heart disease of native coronary artery without angina pectoris: Secondary | ICD-10-CM | POA: Diagnosis not present

## 2013-08-30 DIAGNOSIS — M48061 Spinal stenosis, lumbar region without neurogenic claudication: Secondary | ICD-10-CM | POA: Diagnosis not present

## 2013-08-30 DIAGNOSIS — I509 Heart failure, unspecified: Secondary | ICD-10-CM | POA: Diagnosis not present

## 2013-08-30 DIAGNOSIS — I129 Hypertensive chronic kidney disease with stage 1 through stage 4 chronic kidney disease, or unspecified chronic kidney disease: Secondary | ICD-10-CM | POA: Diagnosis not present

## 2013-08-30 DIAGNOSIS — IMO0001 Reserved for inherently not codable concepts without codable children: Secondary | ICD-10-CM | POA: Diagnosis not present

## 2013-08-30 DIAGNOSIS — N189 Chronic kidney disease, unspecified: Secondary | ICD-10-CM | POA: Diagnosis not present

## 2013-09-06 DIAGNOSIS — N189 Chronic kidney disease, unspecified: Secondary | ICD-10-CM | POA: Diagnosis not present

## 2013-09-06 DIAGNOSIS — I509 Heart failure, unspecified: Secondary | ICD-10-CM | POA: Diagnosis not present

## 2013-09-06 DIAGNOSIS — I2510 Atherosclerotic heart disease of native coronary artery without angina pectoris: Secondary | ICD-10-CM | POA: Diagnosis not present

## 2013-09-06 DIAGNOSIS — M48061 Spinal stenosis, lumbar region without neurogenic claudication: Secondary | ICD-10-CM | POA: Diagnosis not present

## 2013-09-06 DIAGNOSIS — IMO0001 Reserved for inherently not codable concepts without codable children: Secondary | ICD-10-CM | POA: Diagnosis not present

## 2013-09-06 DIAGNOSIS — I129 Hypertensive chronic kidney disease with stage 1 through stage 4 chronic kidney disease, or unspecified chronic kidney disease: Secondary | ICD-10-CM | POA: Diagnosis not present

## 2013-11-28 ENCOUNTER — Other Ambulatory Visit: Payer: Self-pay | Admitting: Family Medicine

## 2013-11-29 ENCOUNTER — Other Ambulatory Visit: Payer: Self-pay | Admitting: Cardiology

## 2013-12-19 ENCOUNTER — Other Ambulatory Visit: Payer: Self-pay | Admitting: Cardiology

## 2014-01-02 ENCOUNTER — Other Ambulatory Visit: Payer: Self-pay | Admitting: Cardiology

## 2014-01-16 ENCOUNTER — Other Ambulatory Visit: Payer: Self-pay | Admitting: Cardiology

## 2014-01-31 ENCOUNTER — Encounter: Payer: Self-pay | Admitting: Cardiology

## 2014-01-31 ENCOUNTER — Ambulatory Visit (INDEPENDENT_AMBULATORY_CARE_PROVIDER_SITE_OTHER): Payer: Medicare Other | Admitting: Cardiology

## 2014-01-31 VITALS — BP 140/75 | HR 91 | Ht 66.0 in | Wt 148.0 lb

## 2014-01-31 DIAGNOSIS — I2510 Atherosclerotic heart disease of native coronary artery without angina pectoris: Secondary | ICD-10-CM | POA: Diagnosis not present

## 2014-01-31 DIAGNOSIS — I5022 Chronic systolic (congestive) heart failure: Secondary | ICD-10-CM

## 2014-01-31 NOTE — Patient Instructions (Addendum)
The current medical regimen is effective;  continue present plan and medications.  Follow up in 6 months with Dr Hochrein.  You will receive a letter in the mail 2 months before you are due.  Please call us when you receive this letter to schedule your follow up appointment.  

## 2014-01-31 NOTE — Progress Notes (Signed)
HPI The patient presents for follow up of CAD.  She was last admitted on 6/22-6/25/13 with a NSTEMI in the setting of UTI, pneumonia and a/c systolic CHF. She was diuresed and placed on antibiotics. She developed worsening creatinine with diuresis. Medical therapy was recommended for her NSTEMI given her renal insufficiency. Echocardiogram 03/12/12 demonstrated an EF 35-40%.  Since I last saw her she has had no acute cardiac complaints.  However,she did fall and was in the hospital in November with a pneumothorax and fractured rib.  However, she reports no cardiac complaints since that hospitalization in Nov of last year.  She has been painting her porch and cleaning.  The patient denies any new symptoms such as chest discomfort, neck or arm discomfort. There has been no new shortness of breath, PND or orthopnea. There have been no reported palpitations, presyncope or syncope.  She does have some bruising with her Plavix.     No Known Allergies  Current Outpatient Prescriptions  Medication Sig Dispense Refill  . amLODipine-olmesartan (AZOR) 5-20 MG per tablet Take 1 tablet by mouth as needed.  30 tablet  0  . aspirin 81 MG tablet Take 81 mg by mouth daily.        . carvedilol (COREG) 3.125 MG tablet TAKE ONE TABLET BY MOUTH TWICE A DAY WITH MEALS  60 tablet  0  . isosorbide mononitrate (IMDUR) 30 MG 24 hr tablet TAKE ONE HALF TABLET BY MOUTH EVERY DAY  30 tablet  10  . levothyroxine (SYNTHROID, LEVOTHROID) 50 MCG tablet Take 1 tablet (50 mcg total) by mouth daily before breakfast.  30 tablet  5  . polyethylene glycol powder (GLYCOLAX/MIRALAX) powder Take 17 g by mouth daily.      . pravastatin (PRAVACHOL) 40 MG tablet Take 1 tablet (40 mg total) by mouth daily.  30 tablet  11  . furosemide (LASIX) 40 MG tablet TAKE ONE TABLET BY MOUTH ONE  TIME DAILY  30 tablet  0  . nitroGLYCERIN (NITROSTAT) 0.4 MG SL tablet Place 1 tablet (0.4 mg total) under the tongue every 5 (five) minutes x 3 doses as  needed for chest pain.  25 tablet  4  . traMADol (ULTRAM) 50 MG tablet Take 1-2 tablets (50-100 mg total) by mouth every 6 (six) hours as needed (Pain).  30 tablet  0   No current facility-administered medications for this visit.    Past Medical History  Diagnosis Date  . Carotid artery stenosis     Left carotid endarterectomy 02/2001  . Hypertension   . Hyperlipidemia   . Thyroid disease     Euthyroid 02/2012  . CAD (coronary artery disease)     a. NSTEMI in 2011 s/p PCI/DES to distal Cx, s/p PCI/DES to OM2 (EF 40%). b. NSTEMI 02/2012 treated conservatively.  . Arthritis   . CHF (congestive heart failure)     a. EF 40% in 2011. b. EF 35-40% by echo 03/12/12.  [name] Kitchen Respiratory failure     02/2012 - requiring bipap - multifactorial in setting of PNA/CHF/NSTEMI  . Pneumonia     02/2012  . Hyponatremia     Noted on 02/2012 admission  . Renal insufficiency     Noted on 02/2012 admission    Past Surgical History  Procedure Laterality Date  . Carotid endarterectomy      left 02/2001  . Cardiac catheterization      ROS  As stated in the HPI and negative for all other systems except  PHYSICAL EXAM BP 140/75  Pulse 91  Ht 5\' 6"  (1.676 m)  Wt 148 lb (67.132 kg)  BMI 23.90 kg/m2 NECK:  No jugular venous distention, waveform within normal limits, carotid upstroke brisk and symmetric, left bruit and CEA scar, no thyromegaly LUNGS:  Clear to auscultation bilaterally BACK:  No CVA tenderness CHEST:  Unremarkable HEART:  PMI not displaced or sustained,,S1 and S2 within normal limits, no S3, no S4, no clicks, no rubs, no murmurs ABD:  Flat, positive bowel sounds normal in frequency in pitch, no bruits, no rebound, no guarding, no midline pulsatile mass, no hepatomegaly, no splenomegaly EXT:  2 plus pulses upper, diminished bilateral DP/PT, no edema, no cyanosis no clubbing  EKG:  NSR, rate 93, WNL, intervals WNL, no acute ST T wave changes.  01/31/2014  ASSESSMENT AND PLAN  Coronary artery  disease  She is having no symptoms.  No change in therapy is indicated.   She and I had the discussion and I would like for her to remain on her Plavix and ASA as she has had no cardiovascular events on this combination.    Ischemic cardiomyopathy  She is euvolemic. We will continue with the meds as listed.   Chronic kidney disease  Labs have been stable and this will be followed by her primary provider.    Hypertension  She only wants to take the Azor PRN and I think that that is OK.    Hospital charts reviewed from Nov.

## 2014-02-01 ENCOUNTER — Other Ambulatory Visit: Payer: Self-pay | Admitting: Cardiology

## 2014-02-13 ENCOUNTER — Other Ambulatory Visit: Payer: Self-pay | Admitting: Cardiology

## 2014-02-15 DIAGNOSIS — Z961 Presence of intraocular lens: Secondary | ICD-10-CM | POA: Diagnosis not present

## 2014-02-15 DIAGNOSIS — H524 Presbyopia: Secondary | ICD-10-CM | POA: Diagnosis not present

## 2014-02-15 DIAGNOSIS — H353190 Nonexudative age-related macular degeneration, unspecified eye, stage unspecified: Secondary | ICD-10-CM | POA: Diagnosis not present

## 2014-02-17 ENCOUNTER — Other Ambulatory Visit: Payer: Self-pay | Admitting: Cardiology

## 2014-02-24 ENCOUNTER — Other Ambulatory Visit: Payer: Self-pay | Admitting: Cardiology

## 2014-03-03 ENCOUNTER — Other Ambulatory Visit: Payer: Self-pay | Admitting: Family Medicine

## 2014-03-15 ENCOUNTER — Other Ambulatory Visit: Payer: Self-pay | Admitting: Family Medicine

## 2014-03-16 NOTE — Telephone Encounter (Signed)
Last seen 09/07/13  DR Ernestina Patches

## 2014-04-17 ENCOUNTER — Other Ambulatory Visit: Payer: Self-pay | Admitting: Family Medicine

## 2014-04-18 NOTE — Telephone Encounter (Signed)
Last seen 08/28/13  Dr Ernestina Patches

## 2014-05-23 ENCOUNTER — Other Ambulatory Visit: Payer: Self-pay | Admitting: *Deleted

## 2014-05-23 MED ORDER — CLOPIDOGREL BISULFATE 75 MG PO TABS: ORAL_TABLET | ORAL | Status: DC

## 2014-05-23 MED ORDER — PRAVASTATIN SODIUM 40 MG PO TABS: 40.0000 mg | ORAL_TABLET | Freq: Every day | ORAL | Status: DC

## 2014-05-23 NOTE — Telephone Encounter (Signed)
Last ov 12/14. No lipids in Epic. ntbs.

## 2014-06-11 ENCOUNTER — Encounter: Payer: Self-pay | Admitting: Obstetrics & Gynecology

## 2014-06-11 ENCOUNTER — Ambulatory Visit (INDEPENDENT_AMBULATORY_CARE_PROVIDER_SITE_OTHER): Payer: Medicare Other | Admitting: Obstetrics & Gynecology

## 2014-06-11 VITALS — BP 123/56 | HR 79 | Resp 16 | Ht 66.0 in | Wt 143.0 lb

## 2014-06-11 DIAGNOSIS — I2510 Atherosclerotic heart disease of native coronary artery without angina pectoris: Secondary | ICD-10-CM | POA: Diagnosis not present

## 2014-06-11 DIAGNOSIS — K649 Unspecified hemorrhoids: Secondary | ICD-10-CM | POA: Insufficient documentation

## 2014-06-11 DIAGNOSIS — K648 Other hemorrhoids: Secondary | ICD-10-CM

## 2014-06-11 NOTE — Progress Notes (Signed)
Patient ID: Suzzette Righter, female   DOB: 1919-05-12, 80 y.o.   MRN: [id number]  Chief Complaint  Patient presents with  . Gynecologic Exam    HPI Dailyn T Boer is a 80 y.o. female.  No obstetric history on file. Recently experience hemorrhoid which bled. She used preparation H and sx have resolved HPI  Past Medical History  Diagnosis Date  . Carotid artery stenosis     Left carotid endarterectomy 02/2001  . Hypertension   . Hyperlipidemia   . Thyroid disease     Euthyroid 02/2012  . CAD (coronary artery disease)     a. NSTEMI in 2011 s/p PCI/DES to distal Cx, s/p PCI/DES to OM2 (EF 40%). b. NSTEMI 02/2012 treated conservatively.  . Arthritis   . CHF (congestive heart failure)     a. EF 40% in 2011. b. EF 35-40% by echo 03/12/12.  [name] Kitchen Respiratory failure     02/2012 - requiring bipap - multifactorial in setting of PNA/CHF/NSTEMI  . Pneumonia     02/2012  . Hyponatremia     Noted on 02/2012 admission  . Renal insufficiency     Noted on 02/2012 admission    Past Surgical History  Procedure Laterality Date  . Carotid endarterectomy      left 02/2001  . Cardiac catheterization      Family History  Problem Relation Age of Onset  . Prostate cancer    . Heart failure Mother 68  . Coronary artery disease Brother 87    CABG, alive at 32  . Coronary artery disease Brother 22    Died  . Heart failure Sister   . Heart failure Sister     Social History History  Substance Use Topics  . Smoking status: Never Smoker   . Smokeless tobacco: Never Used  . Alcohol Use: No    No Known Allergies  Current Outpatient Prescriptions  Medication Sig Dispense Refill  . aspirin 81 MG tablet Take 81 mg by mouth daily.        . AZOR 5-20 MG per tablet TAKE ONE TABLET BY MOUTH ONE TIME DAILY  30 tablet  5  . carvedilol (COREG) 3.125 MG tablet TAKE ONE TABLET BY MOUTH TWICE A DAY WITH MEALS  60 tablet  0  . carvedilol (COREG) 3.125 MG tablet TAKE ONE TABLET BY MOUTH TWICE A DAY WITH MEALS   60 tablet  5  . clopidogrel (PLAVIX) 75 MG tablet TAKE ONE TABLET BY MOUTH ONE TIME DAILY  30 tablet  0  . furosemide (LASIX) 40 MG tablet TAKE ONE TABLET BY MOUTH ONE   TIME DAILY  30 tablet  4  . isosorbide mononitrate (IMDUR) 30 MG 24 hr tablet TAKE ONE HALF TABLET BY MOUTH EVERY DAY  30 tablet  10  . levothyroxine (SYNTHROID, LEVOTHROID) 50 MCG tablet TAKE 1 TABLET (50 MCG TOTAL)  BY MOUTH DAILY BEFORE BREAKFAST.  30 tablet  5  . polyethylene glycol powder (GLYCOLAX/MIRALAX) powder Take 17 g by mouth daily.      . pravastatin (PRAVACHOL) 40 MG tablet Take 1 tablet (40 mg total) by mouth daily.  30 tablet  0  . traMADol (ULTRAM) 50 MG tablet Take 1-2 tablets (50-100 mg total) by mouth every 6 (six) hours as needed (Pain).  30 tablet  0  . nitroGLYCERIN (NITROSTAT) 0.4 MG SL tablet Place 1 tablet (0.4 mg total) under the tongue every 5 (five) minutes x 3 doses as needed for chest pain.  25  tablet  4   No current facility-administered medications for this visit.    Review of Systems Review of Systems  Genitourinary: Positive for frequency. Negative for vaginal bleeding, vaginal discharge, vaginal pain and pelvic pain.    Blood pressure 123/56, pulse 79, resp. rate 16, height 5\' 6"  (1.676 m), weight 143 lb (64.864 kg).  Physical Exam Physical Exam  Constitutional: She appears well-developed. No distress.  Pulmonary/Chest: Effort normal. No respiratory distress.  Psychiatric: She has a normal mood and affect. Her behavior is normal.    Data Reviewed history  Assessment    Recent hemorrhoids now resolved    Plan    To primary care prn, does not need routine Gyn f/u        Bria Portales 06/11/2014, 3:01 PM

## 2014-06-11 NOTE — Patient Instructions (Signed)

## 2014-06-27 ENCOUNTER — Other Ambulatory Visit: Payer: Self-pay | Admitting: Family Medicine

## 2014-06-29 NOTE — Telephone Encounter (Signed)
Notified at last refill that NTBS. Patient of [place]. No visit since 04-07-13. Please advise

## 2014-06-29 NOTE — Telephone Encounter (Signed)
no more refills without being seen  

## 2014-07-07 ENCOUNTER — Other Ambulatory Visit: Payer: Self-pay | Admitting: Physician Assistant

## 2014-07-17 ENCOUNTER — Other Ambulatory Visit: Payer: Self-pay | Admitting: Family Medicine

## 2014-07-19 ENCOUNTER — Telehealth: Payer: Self-pay | Admitting: Family Medicine

## 2014-07-19 MED ORDER — PRAVASTATIN SODIUM 40 MG PO TABS: 40.0000 mg | ORAL_TABLET | Freq: Every day | ORAL | Status: DC

## 2014-07-19 NOTE — Telephone Encounter (Signed)
Rx sent in to pharmacy. 

## 2014-07-19 NOTE — Telephone Encounter (Signed)
Notified at last refill that NTBS. Please advise 

## 2014-08-01 ENCOUNTER — Encounter: Payer: Self-pay | Admitting: Cardiology

## 2014-08-01 ENCOUNTER — Ambulatory Visit (INDEPENDENT_AMBULATORY_CARE_PROVIDER_SITE_OTHER): Payer: Medicare Other | Admitting: Cardiology

## 2014-08-01 VITALS — BP 138/60 | HR 76 | Ht 66.0 in | Wt 147.0 lb

## 2014-08-01 DIAGNOSIS — I2510 Atherosclerotic heart disease of native coronary artery without angina pectoris: Secondary | ICD-10-CM

## 2014-08-01 DIAGNOSIS — I10 Essential (primary) hypertension: Secondary | ICD-10-CM | POA: Diagnosis not present

## 2014-08-01 MED ORDER — FUROSEMIDE 40 MG PO TABS: ORAL_TABLET | ORAL | Status: DC

## 2014-08-01 MED ORDER — CLOPIDOGREL BISULFATE 75 MG PO TABS: ORAL_TABLET | ORAL | Status: DC

## 2014-08-01 MED ORDER — CARVEDILOL 3.125 MG PO TABS: ORAL_TABLET | ORAL | Status: DC

## 2014-08-01 NOTE — Progress Notes (Signed)
HPI The patient presents for follow up of CAD.  She has been medically managed recently for coronary artery disease. This is described elsewhere. Since I last saw her she has not been hospitalized or had any overt cardiovascular complaints. She does not describe any new shortness of breath, PND or orthopnea. She says she might wake up in the morning and feel like she has to wait to do anything because she's a little more breathless but this goes away. She's not describing substernal chest pressure, neck or arm discomfort. She's not having any palpitations, presyncope or syncope. She's had no PND or orthopnea. She did have a fall but has had no loss of consciousness. She had no trauma with this fall as opposed to the previous episode where she had rib fracture or pneumothorax.    No Known Allergies  Current Outpatient Prescriptions  Medication Sig Dispense Refill  . aspirin 81 MG tablet Take 81 mg by mouth daily.      . AZOR 5-20 MG per tablet TAKE ONE TABLET BY MOUTH ONE TIME DAILY (Patient taking differently: take one every other day) 30 tablet 5  . carvedilol (COREG) 3.125 MG tablet TAKE ONE TABLET BY MOUTH TWICE A DAY WITH MEALS 60 tablet 0  . clopidogrel (PLAVIX) 75 MG tablet TAKE ONE TABLET BY MOUTH ONE TIME DAILY 30 tablet 0  . furosemide (LASIX) 40 MG tablet TAKE ONE TABLET BY MOUTH ONE   TIME DAILY 30 tablet 4  . isosorbide mononitrate (IMDUR) 30 MG 24 hr tablet TAKE ONE HALF TABLET BY MOUTH EVERY DAY 30 tablet 3  . levothyroxine (SYNTHROID, LEVOTHROID) 50 MCG tablet TAKE 1 TABLET (50 MCG TOTAL)  BY MOUTH DAILY BEFORE BREAKFAST. 30 tablet 5  . polyethylene glycol powder (GLYCOLAX/MIRALAX) powder Take 17 g by mouth daily. (Patient taking differently: Take 17 g by mouth as needed. )    . pravastatin (PRAVACHOL) 40 MG tablet Take 1 tablet (40 mg total) by mouth daily. 30 tablet 1  . traMADol (ULTRAM) 50 MG tablet Take 1-2 tablets (50-100 mg total) by mouth every 6 (six) hours as needed  (Pain). 30 tablet 0  . nitroGLYCERIN (NITROSTAT) 0.4 MG SL tablet Place 1 tablet (0.4 mg total) under the tongue every 5 (five) minutes x 3 doses as needed for chest pain. 25 tablet 4   No current facility-administered medications for this visit.    Past Medical History  Diagnosis Date  . Carotid artery stenosis     Left carotid endarterectomy 02/2001  . Hypertension   . Hyperlipidemia   . Thyroid disease     Euthyroid 02/2012  . CAD (coronary artery disease)     a. NSTEMI in 2011 s/p PCI/DES to distal Cx, s/p PCI/DES to OM2 (EF 40%). b. NSTEMI 02/2012 treated conservatively.  . Arthritis   . CHF (congestive heart failure)     a. EF 40% in 2011. b. EF 35-40% by echo 03/12/12.  [name] Kitchen Respiratory failure     02/2012 - requiring bipap - multifactorial in setting of PNA/CHF/NSTEMI  . Pneumonia     02/2012  . Hyponatremia     Noted on 02/2012 admission  . Renal insufficiency     Noted on 02/2012 admission    Past Surgical History  Procedure Laterality Date  . Carotid endarterectomy      left 02/2001  . Cardiac catheterization      ROS  As stated in the HPI and negative for all other systems except  PHYSICAL EXAM  BP 138/60 mmHg  Pulse 76  Ht 5\' 6"  (1.676 m)  Wt 147 lb (66.679 kg)  BMI 23.74 kg/m2 NECK:  No jugular venous distention, waveform within normal limits, carotid upstroke brisk and symmetric, left bruit and CEA scar, no thyromegaly LUNGS:  Clear to auscultation bilaterally BACK:  No CVA tenderness CHEST:  Unremarkable HEART:  PMI not displaced or sustained,,S1 and S2 within normal limits, no S3, no S4, no clicks, no rubs, no murmurs ABD:  Flat, positive bowel sounds normal in frequency in pitch, no bruits, no rebound, no guarding, no midline pulsatile mass, no hepatomegaly, no splenomegaly EXT:  2 plus pulses upper, diminished bilateral DP/PT, mild right leg edema with some venous stasis changes, no cyanosis no clubbing SKIN:  Mild diffuse rash on her arms and upper  chest  EKG:  NSR, rate 76, WNL, intervals WNL, no acute ST T wave changes.  08/01/2014  ASSESSMENT AND PLAN  Coronary artery disease  She is having no symptoms.  No change in therapy is indicated.   She and I had the discussion and I would like for her to remain on her Plavix and ASA.  I think we should continue dual agents because she has had NSTEMI that we are managing medically. We discussed the risks benefits of stopping this medication if she does have some bruising. She thinks she can deal with this and will stay on the meds as listed.  Ischemic cardiomyopathy  She is euvolemic. We will continue with the meds as listed.   Chronic kidney disease  This is followed by Wardell Honour, MD  Hypertension  She only wants to take the Azor PRN and does this infrequently and I think that that is OK.

## 2014-08-01 NOTE — Patient Instructions (Signed)
The current medical regimen is effective;  continue present plan and medications.  Follow up in 1 year with Dr. Hochrein in [place].  You will receive a letter in the mail 2 months before you are due.  Please call us when you receive this letter to schedule your follow up appointment.  

## 2014-08-21 ENCOUNTER — Telehealth: Payer: Self-pay | Admitting: Family Medicine

## 2014-08-21 DIAGNOSIS — M79671 Pain in right foot: Secondary | ICD-10-CM | POA: Diagnosis not present

## 2014-08-21 DIAGNOSIS — M7661 Achilles tendinitis, right leg: Secondary | ICD-10-CM | POA: Diagnosis not present

## 2014-08-21 NOTE — Telephone Encounter (Signed)
Appt made

## 2014-08-24 ENCOUNTER — Ambulatory Visit (INDEPENDENT_AMBULATORY_CARE_PROVIDER_SITE_OTHER): Payer: Medicare Other | Admitting: Family Medicine

## 2014-08-24 ENCOUNTER — Encounter: Payer: Self-pay | Admitting: Family Medicine

## 2014-08-24 VITALS — BP 160/56 | HR 78 | Temp 97.4°F | Ht 66.0 in | Wt 144.0 lb

## 2014-08-24 DIAGNOSIS — R21 Rash and other nonspecific skin eruption: Secondary | ICD-10-CM | POA: Diagnosis not present

## 2014-08-24 DIAGNOSIS — I2510 Atherosclerotic heart disease of native coronary artery without angina pectoris: Secondary | ICD-10-CM

## 2014-08-24 DIAGNOSIS — L03115 Cellulitis of right lower limb: Secondary | ICD-10-CM

## 2014-08-24 LAB — POCT SKIN KOH: Skin KOH, POC: NEGATIVE

## 2014-08-24 MED ORDER — CEPHALEXIN 500 MG PO CAPS: 500.0000 mg | ORAL_CAPSULE | Freq: Two times a day (BID) | ORAL | Status: DC

## 2014-08-24 MED ORDER — FLUOCINONIDE-E 0.05 % EX CREA: TOPICAL_CREAM | CUTANEOUS | Status: DC

## 2014-08-24 NOTE — Patient Instructions (Signed)
Do not use any fabric softeners Change detergent to All You may continue to use [place] soap Use cream as directed and take antibiotic as directed Try not to scratch the area of skin

## 2014-08-24 NOTE — Progress Notes (Signed)
Subjective:    Patient ID: Lori [name], female    DOB: October 05, 1918, 80 y.o.   MRN: [id number]  HPI Patient here today for right lower leg rash, pain, and itching. The patient indicates that she had a previous injury to this leg from a door hitting it. The itching and rash have been a problem for weeks. She does not use any fabric softener and she uses [place] soap and gain detergent. The area is pruritic and worrisome. There are several excoriated areas on the right anterior and lateral lower leg.         Patient Active Problem List   Diagnosis Date Noted  . Hemorrhoids 06/11/2014  . Ileus 08/08/2013  . Fall 08/07/2013  . Right rib fracture 08/04/2013  . Pneumothorax, right 08/04/2013  . Scalp abrasion 08/04/2013  . Spinal stenosis of lumbar region 05/26/2013  . Chronic systolic heart failure 83/15/1761  . Ischemic cardiomyopathy 03/21/2012  . CKD (chronic kidney disease) 03/21/2012  . UTI (lower urinary tract infection) 03/13/2012  . Acute respiratory failure with hypoxia 03/12/2012  . Pulmonary edema 03/12/2012  . NSTEMI (non-ST elevated myocardial infarction) 03/12/2012  . Pneumonia 03/12/2012  . Tachycardia 03/04/2011  . Dyslipidemia 12/10/2010  . CORONARY ATHEROSCLEROSIS NATIVE CORONARY ARTERY 08/27/2010  . Essential hypertension 08/26/2010  . CAROTID STENOSIS 08/26/2010   Outpatient Encounter Prescriptions as of 08/24/2014  Medication Sig  . aspirin 81 MG tablet Take 81 mg by mouth daily.    . AZOR 5-20 MG per tablet TAKE ONE TABLET BY MOUTH ONE TIME DAILY (Patient taking differently: take one every other day)  . carvedilol (COREG) 3.125 MG tablet TAKE ONE TABLET BY MOUTH TWICE A DAY WITH MEALS  . clopidogrel (PLAVIX) 75 MG tablet TAKE ONE TABLET BY MOUTH ONE TIME DAILY  . furosemide (LASIX) 40 MG tablet TAKE ONE TABLET BY MOUTH ONE   TIME DAILY  . isosorbide mononitrate (IMDUR) 30 MG 24 hr tablet TAKE ONE HALF TABLET BY MOUTH EVERY DAY  . levothyroxine  (SYNTHROID, LEVOTHROID) 50 MCG tablet TAKE 1 TABLET (50 MCG TOTAL)  BY MOUTH DAILY BEFORE BREAKFAST.  [name] Kitchen polyethylene glycol powder (GLYCOLAX/MIRALAX) powder Take 17 g by mouth daily. (Patient taking differently: Take 17 g by mouth as needed. )  . traMADol (ULTRAM) 50 MG tablet Take 1-2 tablets (50-100 mg total) by mouth every 6 (six) hours as needed (Pain).  . nitroGLYCERIN (NITROSTAT) 0.4 MG SL tablet Place 1 tablet (0.4 mg total) under the tongue every 5 (five) minutes x 3 doses as needed for chest pain.  . [DISCONTINUED] pravastatin (PRAVACHOL) 40 MG tablet Take 1 tablet (40 mg total) by mouth daily.    Review of Systems  Constitutional: Negative.   HENT: Negative.   Eyes: Negative.   Respiratory: Negative.   Cardiovascular: Negative.   Gastrointestinal: Negative.   Endocrine: Negative.   Genitourinary: Negative.   Musculoskeletal: Negative.   Skin: Positive for rash (lower right leg ).  Allergic/Immunologic: Negative.   Neurological: Negative.   Hematological: Negative.   Psychiatric/Behavioral: Negative.        Objective:   Physical Exam  Constitutional: No distress.  Elderly and alert  HENT:  Head: Normocephalic.  Eyes: EOM are normal. Pupils are equal, round, and reactive to light. Right eye exhibits no discharge. No scleral icterus.  Neck: Normal range of motion.  Musculoskeletal: Normal range of motion.  Neurological: She is alert.  Skin: Skin is warm and dry. Rash noted. There is erythema. No pallor.  There  are several excoriated areas some larger than others on the right anterior and lateral lower leg. There is scaling and erythema without drainage. A wet prep was taken from the area of skin involved for KOH  Psychiatric: She has a normal mood and affect. Her behavior is normal. Judgment and thought content normal.  Nursing note and vitals reviewed.  BP 160/56 mmHg  Pulse 78  Temp(Src) 97.4 F (36.3 C)  Ht 5\' 6"  (1.676 m)  Wt 144 lb (65.318 kg)  BMI 23.25  kg/m2  A KOH done of the skin lesion was negative for fungus       Assessment & Plan:  1. Rash and nonspecific skin eruption - POCT Skin KOH - cephALEXin (KEFLEX) 500 MG capsule; Take 1 capsule (500 mg total) by mouth 2 (two) times daily.  Dispense: 20 capsule; Refill: 0  2. Cellulitis of right lower extremity - cephALEXin (KEFLEX) 500 MG capsule; Take 1 capsule (500 mg total) by mouth 2 (two) times daily.  Dispense: 20 capsule; Refill: 0  Meds ordered this encounter  Medications  . cephALEXin (KEFLEX) 500 MG capsule    Sig: Take 1 capsule (500 mg total) by mouth 2 (two) times daily.    Dispense:  20 capsule    Refill:  0  . fluocinonide-emollient (LIDEX-E) 0.05 % cream    Sig: Apply sparingly twice daily to affected rash    Dispense:  30 g    Refill:  0   Patient Instructions  Do not use any fabric softeners Change detergent to All You may continue to use [place] soap Use cream as directed and take antibiotic as directed Try not to scratch the area of skin   Arrie Senate MD

## 2014-09-06 ENCOUNTER — Encounter: Payer: Self-pay | Admitting: Family Medicine

## 2014-09-06 ENCOUNTER — Ambulatory Visit (INDEPENDENT_AMBULATORY_CARE_PROVIDER_SITE_OTHER): Payer: Medicare Other | Admitting: Family Medicine

## 2014-09-06 VITALS — BP 136/65 | HR 73 | Temp 96.7°F | Ht 66.0 in | Wt 145.0 lb

## 2014-09-06 DIAGNOSIS — R21 Rash and other nonspecific skin eruption: Secondary | ICD-10-CM | POA: Diagnosis not present

## 2014-09-06 DIAGNOSIS — L03115 Cellulitis of right lower limb: Secondary | ICD-10-CM

## 2014-09-06 MED ORDER — FLUOCINONIDE-E 0.05 % EX CREA: TOPICAL_CREAM | CUTANEOUS | Status: DC

## 2014-09-06 NOTE — Progress Notes (Signed)
Subjective:    Patient ID: Lori [name], female    DOB: 1919/01/27, 80 y.o.   MRN: [id number]  HPI Patient here today for 2 week follow up on right lower leg cellulitis. She is very sweet and kind and appears to be alert and she drives by herself. The rash is much improved there is still some redness but it appears to be healing well on her legs. She is using scent free soap and detergent and does not use fabric softeners.       Patient Active Problem List   Diagnosis Date Noted  . Hemorrhoids 06/11/2014  . Ileus 08/08/2013  . Fall 08/07/2013  . Right rib fracture 08/04/2013  . Pneumothorax, right 08/04/2013  . Scalp abrasion 08/04/2013  . Spinal stenosis of lumbar region 05/26/2013  . Chronic systolic heart failure 66/02/3015  . Ischemic cardiomyopathy 03/21/2012  . CKD (chronic kidney disease) 03/21/2012  . UTI (lower urinary tract infection) 03/13/2012  . Acute respiratory failure with hypoxia 03/12/2012  . Pulmonary edema 03/12/2012  . NSTEMI (non-ST elevated myocardial infarction) 03/12/2012  . Pneumonia 03/12/2012  . Tachycardia 03/04/2011  . Dyslipidemia 12/10/2010  . CORONARY ATHEROSCLEROSIS NATIVE CORONARY ARTERY 08/27/2010  . Essential hypertension 08/26/2010  . CAROTID STENOSIS 08/26/2010   Outpatient Encounter Prescriptions as of 09/06/2014  Medication Sig  . aspirin 81 MG tablet Take 81 mg by mouth daily.    . AZOR 5-20 MG per tablet TAKE ONE TABLET BY MOUTH ONE TIME DAILY (Patient taking differently: take one every other day)  . carvedilol (COREG) 3.125 MG tablet TAKE ONE TABLET BY MOUTH TWICE A DAY WITH MEALS  . clopidogrel (PLAVIX) 75 MG tablet TAKE ONE TABLET BY MOUTH ONE TIME DAILY  . fluocinonide-emollient (LIDEX-E) 0.05 % cream Apply sparingly twice daily to affected rash  . furosemide (LASIX) 40 MG tablet TAKE ONE TABLET BY MOUTH ONE   TIME DAILY  . isosorbide mononitrate (IMDUR) 30 MG 24 hr tablet TAKE ONE HALF TABLET BY MOUTH EVERY DAY  .  levothyroxine (SYNTHROID, LEVOTHROID) 50 MCG tablet TAKE 1 TABLET (50 MCG TOTAL)  BY MOUTH DAILY BEFORE BREAKFAST.  [name] Kitchen polyethylene glycol powder (GLYCOLAX/MIRALAX) powder Take 17 g by mouth daily. (Patient taking differently: Take 17 g by mouth as needed. )  . traMADol (ULTRAM) 50 MG tablet Take 1-2 tablets (50-100 mg total) by mouth every 6 (six) hours as needed (Pain).  . nitroGLYCERIN (NITROSTAT) 0.4 MG SL tablet Place 1 tablet (0.4 mg total) under the tongue every 5 (five) minutes x 3 doses as needed for chest pain.  . [DISCONTINUED] cephALEXin (KEFLEX) 500 MG capsule Take 1 capsule (500 mg total) by mouth 2 (two) times daily.    Review of Systems  Constitutional: Negative.   HENT: Negative.   Eyes: Negative.   Respiratory: Negative.   Cardiovascular: Negative.   Gastrointestinal: Negative.   Endocrine: Negative.   Genitourinary: Negative.   Musculoskeletal: Negative.   Skin: Negative.   Allergic/Immunologic: Negative.   Neurological: Negative.   Hematological: Negative.   Psychiatric/Behavioral: Negative.        Objective:   Physical Exam  Constitutional: She is oriented to person, place, and time.  Musculoskeletal: Normal range of motion.  Neurological: She is alert and oriented to person, place, and time.  Skin: Skin is warm. Rash noted. There is erythema.  The rash and erythema on her lower leg is much improved. She has an area behind each ear.  Psychiatric: She has a normal mood and affect.  Her behavior is normal. Judgment and thought content normal.  Nursing note and vitals reviewed.   BP 136/65 mmHg  Pulse 73  Temp(Src) 96.7 F (35.9 C) (Oral)  Ht 5\' 6"  (1.676 m)  Wt 145 lb (65.772 kg)  BMI 23.41 kg/m2       Assessment & Plan:  1. Rash and nonspecific skin eruption - fluocinonide-emollient (LIDEX-E) 0.05 % cream; Apply sparingly twice daily to affected rash  Dispense: 30 g; Refill: 1  2. Cellulitis of right lower extremity - fluocinonide-emollient  (LIDEX-E) 0.05 % cream; Apply sparingly twice daily to affected rash  Dispense: 30 g; Refill: 1  Patient Instructions   Continue to use the cream as needed on her leg and behind your ears. Use it sparingly.  Continue with mild detergent , no fabric softener and scent free soap like Dove.  we will refill the creams if you need more it will be available to you.   Arrie Senate MD

## 2014-09-06 NOTE — Patient Instructions (Signed)
Continue to use the cream as needed on her leg and behind your ears. Use it sparingly.  Continue with mild detergent , no fabric softener and scent free soap like Dove.  we will refill the creams if you need more it will be available to you.

## 2014-09-11 ENCOUNTER — Other Ambulatory Visit: Payer: Self-pay | Admitting: Family Medicine

## 2014-09-15 DIAGNOSIS — N179 Acute kidney failure, unspecified: Secondary | ICD-10-CM | POA: Diagnosis not present

## 2014-09-15 DIAGNOSIS — R531 Weakness: Secondary | ICD-10-CM | POA: Diagnosis not present

## 2014-09-15 DIAGNOSIS — I10 Essential (primary) hypertension: Secondary | ICD-10-CM | POA: Diagnosis not present

## 2014-09-15 DIAGNOSIS — A047 Enterocolitis due to Clostridium difficile: Secondary | ICD-10-CM | POA: Diagnosis not present

## 2014-09-15 DIAGNOSIS — J80 Acute respiratory distress syndrome: Secondary | ICD-10-CM | POA: Diagnosis not present

## 2014-09-15 DIAGNOSIS — E869 Volume depletion, unspecified: Secondary | ICD-10-CM | POA: Diagnosis not present

## 2014-09-15 DIAGNOSIS — I5021 Acute systolic (congestive) heart failure: Secondary | ICD-10-CM | POA: Diagnosis not present

## 2014-09-15 DIAGNOSIS — E860 Dehydration: Secondary | ICD-10-CM | POA: Diagnosis not present

## 2014-09-15 DIAGNOSIS — R197 Diarrhea, unspecified: Secondary | ICD-10-CM | POA: Diagnosis not present

## 2014-09-16 DIAGNOSIS — I5021 Acute systolic (congestive) heart failure: Secondary | ICD-10-CM | POA: Diagnosis present

## 2014-09-16 DIAGNOSIS — J90 Pleural effusion, not elsewhere classified: Secondary | ICD-10-CM | POA: Diagnosis not present

## 2014-09-16 DIAGNOSIS — R748 Abnormal levels of other serum enzymes: Secondary | ICD-10-CM | POA: Diagnosis not present

## 2014-09-16 DIAGNOSIS — R079 Chest pain, unspecified: Secondary | ICD-10-CM | POA: Diagnosis not present

## 2014-09-16 DIAGNOSIS — A047 Enterocolitis due to Clostridium difficile: Secondary | ICD-10-CM | POA: Diagnosis not present

## 2014-09-16 DIAGNOSIS — J80 Acute respiratory distress syndrome: Secondary | ICD-10-CM | POA: Diagnosis present

## 2014-09-16 DIAGNOSIS — E869 Volume depletion, unspecified: Secondary | ICD-10-CM | POA: Diagnosis present

## 2014-09-16 DIAGNOSIS — L299 Pruritus, unspecified: Secondary | ICD-10-CM | POA: Diagnosis present

## 2014-09-16 DIAGNOSIS — I10 Essential (primary) hypertension: Secondary | ICD-10-CM | POA: Diagnosis present

## 2014-09-16 DIAGNOSIS — I340 Nonrheumatic mitral (valve) insufficiency: Secondary | ICD-10-CM | POA: Diagnosis not present

## 2014-09-16 DIAGNOSIS — D649 Anemia, unspecified: Secondary | ICD-10-CM | POA: Diagnosis present

## 2014-09-16 DIAGNOSIS — N179 Acute kidney failure, unspecified: Secondary | ICD-10-CM | POA: Diagnosis present

## 2014-09-16 DIAGNOSIS — R4587 Impulsiveness: Secondary | ICD-10-CM | POA: Diagnosis present

## 2014-09-16 DIAGNOSIS — Z7982 Long term (current) use of aspirin: Secondary | ICD-10-CM | POA: Diagnosis not present

## 2014-09-16 DIAGNOSIS — J811 Chronic pulmonary edema: Secondary | ICD-10-CM | POA: Diagnosis not present

## 2014-09-16 DIAGNOSIS — I2510 Atherosclerotic heart disease of native coronary artery without angina pectoris: Secondary | ICD-10-CM | POA: Diagnosis present

## 2014-09-16 DIAGNOSIS — E162 Hypoglycemia, unspecified: Secondary | ICD-10-CM | POA: Diagnosis present

## 2014-09-16 DIAGNOSIS — Z79899 Other long term (current) drug therapy: Secondary | ICD-10-CM | POA: Diagnosis not present

## 2014-09-16 DIAGNOSIS — E039 Hypothyroidism, unspecified: Secondary | ICD-10-CM | POA: Diagnosis present

## 2014-09-16 DIAGNOSIS — N19 Unspecified kidney failure: Secondary | ICD-10-CM | POA: Diagnosis not present

## 2014-09-16 DIAGNOSIS — R410 Disorientation, unspecified: Secondary | ICD-10-CM | POA: Diagnosis present

## 2014-09-16 DIAGNOSIS — I5189 Other ill-defined heart diseases: Secondary | ICD-10-CM | POA: Diagnosis not present

## 2014-09-24 ENCOUNTER — Telehealth: Payer: Self-pay | Admitting: Family Medicine

## 2014-09-24 NOTE — Telephone Encounter (Signed)
Spoke to daughter [name], pt given follow up appt with Dr. Laurance Flatten 1/11 @ 2.

## 2014-09-25 ENCOUNTER — Ambulatory Visit: Payer: PRIVATE HEALTH INSURANCE | Admitting: Family Medicine

## 2014-09-26 DIAGNOSIS — I509 Heart failure, unspecified: Secondary | ICD-10-CM | POA: Diagnosis not present

## 2014-09-26 DIAGNOSIS — Z9181 History of falling: Secondary | ICD-10-CM | POA: Diagnosis not present

## 2014-09-26 DIAGNOSIS — E039 Hypothyroidism, unspecified: Secondary | ICD-10-CM | POA: Diagnosis not present

## 2014-09-26 DIAGNOSIS — E785 Hyperlipidemia, unspecified: Secondary | ICD-10-CM | POA: Diagnosis not present

## 2014-09-26 DIAGNOSIS — I2510 Atherosclerotic heart disease of native coronary artery without angina pectoris: Secondary | ICD-10-CM | POA: Diagnosis not present

## 2014-09-26 DIAGNOSIS — L03116 Cellulitis of left lower limb: Secondary | ICD-10-CM | POA: Diagnosis not present

## 2014-09-26 DIAGNOSIS — I10 Essential (primary) hypertension: Secondary | ICD-10-CM | POA: Diagnosis not present

## 2014-09-28 DIAGNOSIS — E785 Hyperlipidemia, unspecified: Secondary | ICD-10-CM | POA: Diagnosis not present

## 2014-09-28 DIAGNOSIS — I509 Heart failure, unspecified: Secondary | ICD-10-CM | POA: Diagnosis not present

## 2014-09-28 DIAGNOSIS — E039 Hypothyroidism, unspecified: Secondary | ICD-10-CM | POA: Diagnosis not present

## 2014-09-28 DIAGNOSIS — L03116 Cellulitis of left lower limb: Secondary | ICD-10-CM | POA: Diagnosis not present

## 2014-09-28 DIAGNOSIS — I10 Essential (primary) hypertension: Secondary | ICD-10-CM | POA: Diagnosis not present

## 2014-09-28 DIAGNOSIS — I2510 Atherosclerotic heart disease of native coronary artery without angina pectoris: Secondary | ICD-10-CM | POA: Diagnosis not present

## 2014-10-01 ENCOUNTER — Encounter: Payer: Self-pay | Admitting: Family Medicine

## 2014-10-01 ENCOUNTER — Ambulatory Visit (INDEPENDENT_AMBULATORY_CARE_PROVIDER_SITE_OTHER): Payer: Medicare Other | Admitting: Family Medicine

## 2014-10-01 VITALS — BP 116/59 | HR 80 | Temp 96.6°F | Ht 66.0 in | Wt 143.0 lb

## 2014-10-01 DIAGNOSIS — L03115 Cellulitis of right lower limb: Secondary | ICD-10-CM

## 2014-10-01 DIAGNOSIS — Z09 Encounter for follow-up examination after completed treatment for conditions other than malignant neoplasm: Secondary | ICD-10-CM

## 2014-10-01 DIAGNOSIS — Z8719 Personal history of other diseases of the digestive system: Secondary | ICD-10-CM

## 2014-10-01 DIAGNOSIS — A088 Other specified intestinal infections: Secondary | ICD-10-CM

## 2014-10-01 DIAGNOSIS — IMO0001 Reserved for inherently not codable concepts without codable children: Secondary | ICD-10-CM | POA: Insufficient documentation

## 2014-10-01 DIAGNOSIS — A09 Infectious gastroenteritis and colitis, unspecified: Secondary | ICD-10-CM

## 2014-10-01 LAB — POCT CBC
GRANULOCYTE PERCENT: 44.9 % (ref 37–80)
HCT, POC: 35 % — AB (ref 37.7–47.9)
HEMOGLOBIN: 10.9 g/dL — AB (ref 12.2–16.2)
Lymph, poc: 4.9 — AB (ref 0.6–3.4)
MCH: 26.2 pg — AB (ref 27–31.2)
MCHC: 31 g/dL — AB (ref 31.8–35.4)
MCV: 84.3 fL (ref 80–97)
MPV: 8.5 fL (ref 0–99.8)
POC Granulocyte: 4.9 (ref 2–6.9)
POC LYMPH PERCENT: 45.1 %L (ref 10–50)
Platelet Count, POC: 331 10*3/uL (ref 142–424)
RBC: 4.2 M/uL (ref 4.04–5.48)
RDW, POC: 14.4 %
WBC: 10.9 10*3/uL — AB (ref 4.6–10.2)

## 2014-10-01 NOTE — Progress Notes (Signed)
Subjective:    Patient ID: Lori [name], female    DOB: 1918-12-07, 81 y.o.   MRN: [id number]  HPI Patient here for hospital follow up from [facility] where she was admitted and diagnosed with C-Diff. The patient had been on Keflex in early December for the cellulitis on her leg. She comes back today as a hospital follow-up. Patient indicates that her bowels but were beginning to get normal mood and she was discharged from the hospital. She says they are formed now and she is getting her strength back. She is still seeing home health at home to help her recover from it being in the hospital.         Patient Active Problem List   Diagnosis Date Noted  . Hemorrhoids 06/11/2014  . Ileus 08/08/2013  . Fall 08/07/2013  . Right rib fracture 08/04/2013  . Pneumothorax, right 08/04/2013  . Scalp abrasion 08/04/2013  . Spinal stenosis of lumbar region 05/26/2013  . Chronic systolic heart failure 60/06/9322  . Ischemic cardiomyopathy 03/21/2012  . CKD (chronic kidney disease) 03/21/2012  . UTI (lower urinary tract infection) 03/13/2012  . Acute respiratory failure with hypoxia 03/12/2012  . Pulmonary edema 03/12/2012  . NSTEMI (non-ST elevated myocardial infarction) 03/12/2012  . Pneumonia 03/12/2012  . Tachycardia 03/04/2011  . Dyslipidemia 12/10/2010  . CORONARY ATHEROSCLEROSIS NATIVE CORONARY ARTERY 08/27/2010  . Essential hypertension 08/26/2010  . CAROTID STENOSIS 08/26/2010   Outpatient Encounter Prescriptions as of 10/01/2014  Medication Sig  . aspirin 81 MG tablet Take 81 mg by mouth daily.    . AZOR 5-20 MG per tablet TAKE ONE TABLET BY MOUTH ONE TIME DAILY (Patient taking differently: take one every other day)  . carvedilol (COREG) 3.125 MG tablet TAKE ONE TABLET BY MOUTH TWICE A DAY WITH MEALS  . clopidogrel (PLAVIX) 75 MG tablet TAKE ONE TABLET BY MOUTH ONE TIME DAILY  . fluocinonide-emollient (LIDEX-E) 0.05 % cream Apply sparingly twice daily to affected rash  .  furosemide (LASIX) 40 MG tablet TAKE ONE TABLET BY MOUTH ONE   TIME DAILY  . isosorbide mononitrate (IMDUR) 30 MG 24 hr tablet TAKE ONE HALF TABLET BY MOUTH EVERY DAY  . levothyroxine (SYNTHROID, LEVOTHROID) 50 MCG tablet TAKE 1 TABLET (50 MCG TOTAL)   BY MOUTH DAILY BEFORE BREAKFAST.  [name] Kitchen polyethylene glycol powder (GLYCOLAX/MIRALAX) powder Take 17 g by mouth daily. (Patient taking differently: Take 17 g by mouth as needed. )  . traMADol (ULTRAM) 50 MG tablet Take 1-2 tablets (50-100 mg total) by mouth every 6 (six) hours as needed (Pain).  . nitroGLYCERIN (NITROSTAT) 0.4 MG SL tablet Place 1 tablet (0.4 mg total) under the tongue every 5 (five) minutes x 3 doses as needed for chest pain.    Review of Systems  Constitutional: Negative.   HENT: Negative.   Eyes: Negative.   Respiratory: Negative.   Cardiovascular: Negative.   Gastrointestinal: Negative.   Endocrine: Negative.   Genitourinary: Negative.   Musculoskeletal: Negative.   Skin: Negative.   Allergic/Immunologic: Negative.   Neurological: Negative.   Hematological: Negative.   Psychiatric/Behavioral: Negative.        Objective:   Physical Exam  Constitutional: She is oriented to person, place, and time. She appears well-developed and well-nourished. No distress.  The patient appears to be pleasant and doing well especially for age of 81. She says she feels like she has fully recovered from her bout in the hospital with dehydration and loose bowel movements  HENT:  Head:  Normocephalic and atraumatic.  Mouth/Throat: Oropharynx is clear and moist.  Eyes: Conjunctivae and EOM are normal. Pupils are equal, round, and reactive to light. Right eye exhibits no discharge. Left eye exhibits no discharge. No scleral icterus.  Neck: Normal range of motion. Neck supple. No thyromegaly present.  The patient has bilateral carotid bruits and a left supraclavicular bruit  Cardiovascular: Normal rate and normal heart sounds.   No murmur  heard. The heart is slightly irregular at 84/m  Pulmonary/Chest: Effort normal and breath sounds normal. No respiratory distress. She has no wheezes. She has no rales. She exhibits no tenderness.  Abdominal: Soft. Bowel sounds are normal. She exhibits no mass. There is no tenderness. There is no rebound and no guarding.  There is slight gas and epigastric tenderness. Bowel sounds appear active.  Musculoskeletal: Normal range of motion. She exhibits no edema.  Lymphadenopathy:    She has no cervical adenopathy.  Neurological: She is alert and oriented to person, place, and time. She has normal reflexes. No cranial nerve deficit.  Skin: Skin is warm and dry. Rash noted. No erythema. No pallor.  The atopic dermatitis/rash on her right lower extremity appears much improved.  Psychiatric: She has a normal mood and affect. Her behavior is normal. Judgment and thought content normal.  Nursing note and vitals reviewed.  BP 116/59 mmHg  Pulse 80  Temp(Src) 96.6 F (35.9 C) (Oral)  Ht _0  (1.676 m)  Wt 143 lb (64.864 kg)  BMI 23.09 kg/m2  Please see the discharge summary from [facility] which indicated acute renal failure secondary to dehydration secondary to C. difficile infection. Lab work from [place] was reviewed along with an EKG, chest x-ray and echocardiogram.      Assessment & Plan:  1. Hospital discharge follow-up - POCT CBC - BMP8+EGFR  2. Intestinal infection - POCT CBC - BMP8+EGFR  3. Cellulitis of right lower extremity -Continue topical treatments for leg cellulitis  4. History of Clostridium difficile -Avoid antibiotics as much as possible in the future -Continue fluids -Continue physical therapy  Patient Instructions  Continue to get plenty of fluids in Continue with physical therapy until they are completed with your rehabilitation Avoid antibiotics as much as possible in the future B careful and did not put yourself at risk for falling    Arrie Senate MD

## 2014-10-01 NOTE — Patient Instructions (Signed)
Continue to get plenty of fluids in Continue with physical therapy until they are completed with your rehabilitation Avoid antibiotics as much as possible in the future B careful and did not put yourself at risk for falling

## 2014-10-02 ENCOUNTER — Telehealth: Payer: Self-pay | Admitting: *Deleted

## 2014-10-02 LAB — BMP8+EGFR
BUN/Creatinine Ratio: 14 (ref 11–26)
BUN: 17 mg/dL (ref 10–36)
CALCIUM: 8.2 mg/dL — AB (ref 8.7–10.3)
CO2: 23 mmol/L (ref 18–29)
Chloride: 96 mmol/L — ABNORMAL LOW (ref 97–108)
Creatinine, Ser: 1.18 mg/dL — ABNORMAL HIGH (ref 0.57–1.00)
GFR calc Af Amer: 46 mL/min/{1.73_m2} — ABNORMAL LOW (ref >59)
GFR calc non Af Amer: 40 mL/min/{1.73_m2} — ABNORMAL LOW (ref >59)
Glucose: 107 mg/dL — ABNORMAL HIGH (ref 65–99)
POTASSIUM: 4.6 mmol/L (ref 3.5–5.2)
Sodium: 136 mmol/L (ref 134–144)

## 2014-10-02 NOTE — Telephone Encounter (Signed)
-----   Message from Chipper Herb, MD sent at 10/02/2014  7:25 AM EST ----- The blood sugar is slightly increased at 107. The creatinine, the most important kidney function test is slightly elevated but this is consistent with readings for over a year ago. The electrolytes including potassium are within normal limits except the chloride is slightly decreased and the serum calcium is slightly decreased. We will continue to monitor this. He should continue to take it easy get plenty of rest and get plenty of fluids and.

## 2014-10-18 ENCOUNTER — Other Ambulatory Visit: Payer: Self-pay | Admitting: Family Medicine

## 2014-10-19 ENCOUNTER — Ambulatory Visit: Payer: Medicare Other | Admitting: Family Medicine

## 2014-10-19 DIAGNOSIS — I10 Essential (primary) hypertension: Secondary | ICD-10-CM | POA: Diagnosis not present

## 2014-10-19 DIAGNOSIS — I509 Heart failure, unspecified: Secondary | ICD-10-CM | POA: Diagnosis not present

## 2014-10-19 DIAGNOSIS — I2510 Atherosclerotic heart disease of native coronary artery without angina pectoris: Secondary | ICD-10-CM | POA: Diagnosis not present

## 2014-10-19 DIAGNOSIS — L03116 Cellulitis of left lower limb: Secondary | ICD-10-CM | POA: Diagnosis not present

## 2014-10-23 ENCOUNTER — Other Ambulatory Visit: Payer: Self-pay | Admitting: *Deleted

## 2014-10-23 MED ORDER — LEVOTHYROXINE SODIUM 50 MCG PO TABS: ORAL_TABLET | ORAL | Status: DC

## 2014-11-22 DIAGNOSIS — L820 Inflamed seborrheic keratosis: Secondary | ICD-10-CM | POA: Diagnosis not present

## 2014-11-22 DIAGNOSIS — L309 Dermatitis, unspecified: Secondary | ICD-10-CM | POA: Diagnosis not present

## 2014-11-22 DIAGNOSIS — D225 Melanocytic nevi of trunk: Secondary | ICD-10-CM | POA: Diagnosis not present

## 2014-11-22 DIAGNOSIS — L570 Actinic keratosis: Secondary | ICD-10-CM | POA: Diagnosis not present

## 2014-11-22 DIAGNOSIS — X32XXXD Exposure to sunlight, subsequent encounter: Secondary | ICD-10-CM | POA: Diagnosis not present

## 2014-11-29 DIAGNOSIS — L570 Actinic keratosis: Secondary | ICD-10-CM | POA: Diagnosis not present

## 2014-11-29 DIAGNOSIS — X32XXXD Exposure to sunlight, subsequent encounter: Secondary | ICD-10-CM | POA: Diagnosis not present

## 2014-11-29 DIAGNOSIS — L820 Inflamed seborrheic keratosis: Secondary | ICD-10-CM | POA: Diagnosis not present

## 2014-12-03 ENCOUNTER — Encounter: Payer: Self-pay | Admitting: Internal Medicine

## 2014-12-03 ENCOUNTER — Telehealth: Payer: Self-pay | Admitting: Family Medicine

## 2014-12-03 DIAGNOSIS — K219 Gastro-esophageal reflux disease without esophagitis: Secondary | ICD-10-CM

## 2014-12-03 DIAGNOSIS — R109 Unspecified abdominal pain: Secondary | ICD-10-CM

## 2014-12-03 NOTE — Telephone Encounter (Signed)
Referral placed to get a sooner appt and [facility] aware

## 2014-12-11 ENCOUNTER — Other Ambulatory Visit (INDEPENDENT_AMBULATORY_CARE_PROVIDER_SITE_OTHER): Payer: Medicare Other

## 2014-12-11 ENCOUNTER — Ambulatory Visit (INDEPENDENT_AMBULATORY_CARE_PROVIDER_SITE_OTHER): Payer: Medicare Other | Admitting: Gastroenterology

## 2014-12-11 ENCOUNTER — Encounter: Payer: Self-pay | Admitting: Gastroenterology

## 2014-12-11 VITALS — BP 118/60 | HR 86 | Ht 63.58 in | Wt 133.4 lb

## 2014-12-11 DIAGNOSIS — R1084 Generalized abdominal pain: Secondary | ICD-10-CM

## 2014-12-11 DIAGNOSIS — R110 Nausea: Secondary | ICD-10-CM

## 2014-12-11 DIAGNOSIS — R634 Abnormal weight loss: Secondary | ICD-10-CM | POA: Diagnosis not present

## 2014-12-11 LAB — CBC WITH DIFFERENTIAL/PLATELET
BASOS PCT: 0.3 % (ref 0.0–3.0)
Basophils Absolute: 0 10*3/uL (ref 0.0–0.1)
EOS PCT: 2 % (ref 0.0–5.0)
Eosinophils Absolute: 0.3 10*3/uL (ref 0.0–0.7)
HCT: 37.5 % (ref 36.0–46.0)
Hemoglobin: 12.5 g/dL (ref 12.0–15.0)
Lymphocytes Relative: 30.8 % (ref 12.0–46.0)
Lymphs Abs: 3.9 10*3/uL (ref 0.7–4.0)
MCHC: 33.3 g/dL (ref 30.0–36.0)
MCV: 82 fl (ref 78.0–100.0)
MONO ABS: 1.2 10*3/uL — AB (ref 0.1–1.0)
MONOS PCT: 9.3 % (ref 3.0–12.0)
NEUTROS PCT: 57.6 % (ref 43.0–77.0)
Neutro Abs: 7.3 10*3/uL (ref 1.4–7.7)
PLATELETS: 214 10*3/uL (ref 150.0–400.0)
RBC: 4.58 Mil/uL (ref 3.87–5.11)
RDW: 15.5 % (ref 11.5–15.5)
WBC: 12.6 10*3/uL — AB (ref 4.0–10.5)

## 2014-12-11 LAB — COMPREHENSIVE METABOLIC PANEL
ALK PHOS: 66 U/L (ref 39–117)
ALT: 10 U/L (ref 0–35)
AST: 13 U/L (ref 0–37)
Albumin: 3.6 g/dL (ref 3.5–5.2)
BILIRUBIN TOTAL: 0.4 mg/dL (ref 0.2–1.2)
BUN: 39 mg/dL — ABNORMAL HIGH (ref 6–23)
CO2: 29 mEq/L (ref 19–32)
CREATININE: 1.32 mg/dL — AB (ref 0.40–1.20)
Calcium: 8.8 mg/dL (ref 8.4–10.5)
Chloride: 95 mEq/L — ABNORMAL LOW (ref 96–112)
GFR: 39.82 mL/min — ABNORMAL LOW (ref >60.00)
Glucose, Bld: 103 mg/dL — ABNORMAL HIGH (ref 70–99)
Potassium: 4.7 mEq/L (ref 3.5–5.1)
Sodium: 128 mEq/L — ABNORMAL LOW (ref 135–145)
Total Protein: 6.4 g/dL (ref 6.0–8.3)

## 2014-12-11 LAB — LIPASE: Lipase: 83 U/L — ABNORMAL HIGH (ref 11.0–59.0)

## 2014-12-11 MED ORDER — PANTOPRAZOLE SODIUM 40 MG PO TBEC: 40.0000 mg | DELAYED_RELEASE_TABLET | Freq: Every day | ORAL | Status: DC

## 2014-12-11 MED ORDER — ONDANSETRON HCL 4 MG PO TABS: 4.0000 mg | ORAL_TABLET | Freq: Three times a day (TID) | ORAL | Status: DC | PRN

## 2014-12-11 NOTE — Progress Notes (Addendum)
12/11/2014 Lori [name] [id number] 01-Aug-1919   HISTORY OF PRESENT ILLNESS:  This is a very pleasant 81 year old female who is new to our practice and has been referred here by her PCP, Dr. Laurance Flatten, for complaints of abdominal pain, nausea, loss of appetite, and weight loss.  She is here today with her daughter.  The patient reports nausea without vomiting, abdominal pain in epigastrium and mid-abdomen especially when eating, which has resulted in poor appetite and about 10 pound weight loss over the past few weeks.  These symptoms began suddenly 2-3 weeks ago and prior to that she had been feeling well.  Has a lot of belching with and without eating also.  Says that she feels "sick on her stomach" all the time.  Denies heartburn/reflux.  No bowel issues.  Never had EGD or colonoscopy.  Has not had any evaluation at this point for these symptoms.    Does have a history of Cdiff in 08/2014 that was successfully treated with no further issues related to that.       Past Medical History  Diagnosis Date  . Carotid artery stenosis     Left carotid endarterectomy 02/2001  . Hypertension   . Hyperlipidemia   . Thyroid disease     Euthyroid 02/2012  . CAD (coronary artery disease)     a. NSTEMI in 2011 s/p PCI/DES to distal Cx, s/p PCI/DES to OM2 (EF 40%). b. NSTEMI 02/2012 treated conservatively.  . Arthritis   . CHF (congestive heart failure)     a. EF 40% in 2011. b. EF 35-40% by echo 03/12/12.  [name] Kitchen Respiratory failure     02/2012 - requiring bipap - multifactorial in setting of PNA/CHF/NSTEMI  . Pneumonia     02/2012  . Hyponatremia     Noted on 02/2012 admission  . Renal insufficiency     Noted on 02/2012 admission   Past Surgical History  Procedure Laterality Date  . Carotid endarterectomy      left 02/2001  . Cardiac catheterization      reports that she has never smoked. She has never used smokeless tobacco. She reports that she does not drink alcohol or use illicit drugs. family  history includes Coronary artery disease (age of onset: 40) in her brother; Coronary artery disease (age of onset: 76) in her brother; Heart failure in her sister and sister; Heart failure (age of onset: 25) in her mother; Prostate cancer in an other family member; Rheum arthritis in her father. No Known Allergies    Outpatient Encounter Prescriptions as of 12/11/2014  Medication Sig  . aspirin 81 MG tablet Take 81 mg by mouth daily.    . AZOR 5-20 MG per tablet TAKE ONE TABLET BY MOUTH ONE TIME DAILY (Patient taking differently: take one every other day)  . carvedilol (COREG) 3.125 MG tablet TAKE ONE TABLET BY MOUTH TWICE A DAY WITH MEALS  . clopidogrel (PLAVIX) 75 MG tablet TAKE ONE TABLET BY MOUTH ONE TIME DAILY  . fluocinonide-emollient (LIDEX-E) 0.05 % cream Apply sparingly twice daily to affected rash  . furosemide (LASIX) 40 MG tablet TAKE ONE TABLET BY MOUTH ONE   TIME DAILY  . isosorbide mononitrate (IMDUR) 30 MG 24 hr tablet TAKE ONE HALF TABLET BY MOUTH EVERY DAY  . levothyroxine (SYNTHROID, LEVOTHROID) 50 MCG tablet TAKE 1 TABLET (50 MCG TOTAL)   BY MOUTH DAILY BEFORE BREAKFAST.  [name] Kitchen nitroGLYCERIN (NITROSTAT) 0.4 MG SL tablet Place 1 tablet (0.4 mg  total) under the tongue every 5 (five) minutes x 3 doses as needed for chest pain.  [name] Kitchen ondansetron (ZOFRAN) 4 MG tablet Take 1 tablet (4 mg total) by mouth every 8 (eight) hours as needed for nausea or vomiting.  . pantoprazole (PROTONIX) 40 MG tablet Take 1 tablet (40 mg total) by mouth daily.  . polyethylene glycol powder (GLYCOLAX/MIRALAX) powder Take 17 g by mouth daily. (Patient taking differently: Take 17 g by mouth as needed. )  . traMADol (ULTRAM) 50 MG tablet Take 1-2 tablets (50-100 mg total) by mouth every 6 (six) hours as needed (Pain).     REVIEW OF SYSTEMS  : All other systems reviewed and negative except where noted in the History of Present Illness.   PHYSICAL EXAM: BP 118/60 mmHg  Pulse 86  Ht 5' 3.58" (1.615 m)  Wt  133 lb 6.4 oz (60.51 kg)  BMI 23.20 kg/m2 General: Well developed, but elderly white female in no acute distress Head: Normocephalic and atraumatic Eyes:  Sclerae anicteric, conjunctiva pink. Ears: Normal auditory acuity Lungs: Clear throughout to auscultation Heart: Regular rate and rhythm Abdomen: Soft, non-distended.  Normal bowel sounds.  Moderate epigastric TTP without R/R/G. Musculoskeletal: Symmetrical with no gross deformities  Skin: No lesions on visible extremities Extremities: No edema  Neurological: Alert oriented x 4, grossly non-focal Psychological:  Alert and cooperative. Normal mood and affect  ASSESSMENT AND PLAN: -Upper/mid-abdominal pain with nausea, poor appetite, and weight loss of 10 pounds over the past few weeks.  Will start by checking CT scan of the abdomen and pelvis with contrast to evaluate pancreas, etc and to rule out malignancy.  Other considerations include gallbladder issue vs GERD/ulcer disease.  Will check labs including CBC, CMP, and lipase as well.  Will start her on pantoprazole 40 mg daily empirically and will give her zofran to take prn in the interim.  If CT and labs are unremarkable then I think that she could tolerate and EGD, but is on Plavix so we would need to see about holding that.  Daughter is skeptical about colonoscopy so hopefully colon is grossly normal on cross-sectional imaging.    CC:  Chipper Herb, MD   Addendum: Reviewed and agree with initial management. CT without contrast is unrevealing. EGD discussed/recommended and patient and family trying to decide about proceeding. Slot being held for patient on 12/26/2014 Check with patient next week to see if decision made Jerene Bears, MD

## 2014-12-11 NOTE — Patient Instructions (Signed)
Your physician has requested that you go to the basement for the following lab work before leaving today: CBC, CMP, Lipase  You have been scheduled for a  CT scan of the abdomen and pelvis at [place] ([address]---this is in the same building as Press [facility]).   You are scheduled on 12-12-14 at 9:15am. You should arrive 15 minutes prior to your appointment time for registration. Please follow the written instructions below on the day of your exam:  WARNING: IF YOU ARE ALLERGIC TO IODINE/X-RAY DYE, PLEASE NOTIFY RADIOLOGY IMMEDIATELY AT [phone]! YOU WILL BE GIVEN A 13 HOUR PREMEDICATION PREP.  1) Do not eat or drink anything after 5:00am (4 hours prior to your test) 2) You have been given 2 bottles of oral contrast to drink. The solution may taste               better if refrigerated, but do NOT add ice or any other liquid to this solution. Shake             well before drinking.    Drink 1 bottle of contrast @ 7:15am (2 hours prior to your exam)  Drink 1 bottle of contrast @ 8:15am (1 hour prior to your exam)  You may take any medications as prescribed with a small amount of water except for the following: Metformin, Glucophage, Glucovance, Avandamet, Riomet, Fortamet, Actoplus Met, Janumet, Glumetza or Metaglip. The above medications must be held the day of the exam AND 48 hours after the exam.  The purpose of you drinking the oral contrast is to aid in the visualization of your intestinal tract. The contrast solution may cause some diarrhea. Before your exam is started, you will be given a small amount of fluid to drink. Depending on your individual set of symptoms, you may also receive an intravenous injection of x-ray contrast/dye. Plan on being at [facility] for 30 minutes or long, depending on the type of exam you are having performed.  This test typically takes 30-45 minutes to complete.  If you have any questions regarding your exam or if you need  to reschedule, you may call the CT department at [phone] between the hours of 8:00 am and 5:00 pm, Monday-Friday.  ________________________________________________________________________  We have sent the following medications to your pharmacy for you to pick up at your convenience: Pantoprazole, zofran

## 2014-12-12 ENCOUNTER — Ambulatory Visit (INDEPENDENT_AMBULATORY_CARE_PROVIDER_SITE_OTHER)
Admission: RE | Admit: 2014-12-12 | Discharge: 2014-12-12 | Disposition: A | Discharge status: Home / Self Care | Payer: Medicare Other | Source: Ambulatory Visit | Attending: Gastroenterology | Admitting: Gastroenterology

## 2014-12-12 DIAGNOSIS — R109 Unspecified abdominal pain: Secondary | ICD-10-CM | POA: Diagnosis not present

## 2014-12-12 DIAGNOSIS — R1084 Generalized abdominal pain: Secondary | ICD-10-CM | POA: Diagnosis not present

## 2014-12-12 DIAGNOSIS — R110 Nausea: Secondary | ICD-10-CM | POA: Diagnosis not present

## 2014-12-12 DIAGNOSIS — R634 Abnormal weight loss: Secondary | ICD-10-CM

## 2014-12-13 ENCOUNTER — Telehealth: Payer: Self-pay | Admitting: Gastroenterology

## 2014-12-13 DIAGNOSIS — L570 Actinic keratosis: Secondary | ICD-10-CM | POA: Diagnosis not present

## 2014-12-13 DIAGNOSIS — X32XXXD Exposure to sunlight, subsequent encounter: Secondary | ICD-10-CM | POA: Diagnosis not present

## 2014-12-13 NOTE — Telephone Encounter (Signed)
Patient's daughter calling to let us know that patient does not want to do an EGD at this time.

## 2014-12-13 NOTE — Telephone Encounter (Signed)
Per patient's daughter she is trying taking the medication.

## 2014-12-13 NOTE — Telephone Encounter (Signed)
Ok.  I would have her take the pantoprazole 40 mg daily for 4-8 weeks at least and see how she feels on that medication.  Thank you,  Lori [name]

## 2014-12-28 ENCOUNTER — Other Ambulatory Visit: Payer: Self-pay | Admitting: Cardiology

## 2014-12-31 ENCOUNTER — Other Ambulatory Visit: Payer: Self-pay | Admitting: *Deleted

## 2015-01-23 ENCOUNTER — Ambulatory Visit: Payer: Medicare Other | Admitting: Internal Medicine

## 2015-02-20 ENCOUNTER — Other Ambulatory Visit: Payer: Self-pay | Admitting: Family Medicine

## 2015-02-20 NOTE — Telephone Encounter (Signed)
81 y.o. Is hardly ever seen, no TSH since 08/2013

## 2015-02-20 NOTE — Telephone Encounter (Signed)
no more refills without being seen  

## 2015-02-28 DIAGNOSIS — H04123 Dry eye syndrome of bilateral lacrimal glands: Secondary | ICD-10-CM | POA: Diagnosis not present

## 2015-02-28 DIAGNOSIS — Z961 Presence of intraocular lens: Secondary | ICD-10-CM | POA: Diagnosis not present

## 2015-02-28 DIAGNOSIS — H3531 Nonexudative age-related macular degeneration: Secondary | ICD-10-CM | POA: Diagnosis not present

## 2015-02-28 DIAGNOSIS — H26493 Other secondary cataract, bilateral: Secondary | ICD-10-CM | POA: Diagnosis not present

## 2015-03-07 ENCOUNTER — Other Ambulatory Visit: Payer: Self-pay | Admitting: Cardiology

## 2015-03-08 NOTE — Telephone Encounter (Signed)
Rx(s) sent to pharmacy electronically.  

## 2015-03-18 ENCOUNTER — Other Ambulatory Visit: Payer: Self-pay

## 2015-03-19 ENCOUNTER — Ambulatory Visit (INDEPENDENT_AMBULATORY_CARE_PROVIDER_SITE_OTHER): Payer: Medicare Other | Admitting: Family Medicine

## 2015-03-19 ENCOUNTER — Encounter: Payer: Self-pay | Admitting: Family Medicine

## 2015-03-19 VITALS — BP 137/78 | HR 83 | Temp 98.0°F | Ht 62.5 in | Wt 139.0 lb

## 2015-03-19 DIAGNOSIS — L03115 Cellulitis of right lower limb: Secondary | ICD-10-CM | POA: Diagnosis not present

## 2015-03-19 DIAGNOSIS — I8311 Varicose veins of right lower extremity with inflammation: Secondary | ICD-10-CM

## 2015-03-19 DIAGNOSIS — H9193 Unspecified hearing loss, bilateral: Secondary | ICD-10-CM

## 2015-03-19 DIAGNOSIS — I10 Essential (primary) hypertension: Secondary | ICD-10-CM

## 2015-03-19 DIAGNOSIS — I8312 Varicose veins of left lower extremity with inflammation: Secondary | ICD-10-CM

## 2015-03-19 DIAGNOSIS — E039 Hypothyroidism, unspecified: Secondary | ICD-10-CM | POA: Diagnosis not present

## 2015-03-19 DIAGNOSIS — I872 Venous insufficiency (chronic) (peripheral): Secondary | ICD-10-CM

## 2015-03-19 DIAGNOSIS — R350 Frequency of micturition: Secondary | ICD-10-CM

## 2015-03-19 LAB — POCT UA - MICROSCOPIC ONLY
CRYSTALS, UR, HPF, POC: NEGATIVE
Casts, Ur, LPF, POC: NEGATIVE
Mucus, UA: NEGATIVE
RBC, urine, microscopic: NEGATIVE
YEAST UA: NEGATIVE

## 2015-03-19 LAB — POCT CBC
Granulocyte percent: 50.2 %G (ref 37–80)
HCT, POC: 34.8 % — AB (ref 37.7–47.9)
Hemoglobin: 11 g/dL — AB (ref 12.2–16.2)
Lymph, poc: 4 — AB (ref 0.6–3.4)
MCH, POC: 26.8 pg — AB (ref 27–31.2)
MCHC: 31.5 g/dL — AB (ref 31.8–35.4)
MCV: 85 fL (ref 80–97)
MPV: 9.4 fL (ref 0–99.8)
POC Granulocyte: 4.9 (ref 2–6.9)
POC LYMPH PERCENT: 40.5 %L (ref 10–50)
Platelet Count, POC: 225 10*3/uL (ref 142–424)
RBC: 4.09 M/uL (ref 4.04–5.48)
RDW, POC: 14 %
WBC: 9.8 10*3/uL (ref 4.6–10.2)

## 2015-03-19 LAB — POCT URINALYSIS DIPSTICK
Blood, UA: NEGATIVE
Glucose, UA: NEGATIVE
KETONES UA: NEGATIVE
Nitrite, UA: POSITIVE
Protein, UA: NEGATIVE
Spec Grav, UA: <=1.005
Urobilinogen, UA: NEGATIVE
pH, UA: 6.5

## 2015-03-19 MED ORDER — FLUOCINONIDE-E 0.05 % EX CREA: 1.0000 "application " | TOPICAL_CREAM | Freq: Two times a day (BID) | CUTANEOUS | Status: DC

## 2015-03-19 MED ORDER — CIPROFLOXACIN HCL 250 MG PO TABS: 250.0000 mg | ORAL_TABLET | Freq: Two times a day (BID) | ORAL | Status: DC

## 2015-03-19 NOTE — Progress Notes (Signed)
Subjective:  Patient ID: Lori [name], female    DOB: 03-14-1919  Age: 81 y.o. MRN: [id number]  CC: Rash and Urinary Frequency   HPI Lori [name] presents for increasing rash on the right lower extremity. She wears support stockings and is concerned that they may be the cause. However, chart review shows that this was present several months ago with Dr. Laurance Flatten. She has had problems with chronic stasis dermatitis and has been prescribed support stockings to keep down the edema. Additionally she has urinary frequency. However she has been placed on furosemide. She thinks that is the cause. She denies dysuria and urgency.   follow-up of hypertension. Patient has no history of headache chest pain or shortness of breath or recent cough. Patient also denies symptoms of TIA such as numbness weakness lateralizing. Patient checks  blood pressure at home and has not had any elevated readings recently. Patient denies side effects from his medication. States taking it regularly.   Patient presents for follow-up on  thyroid. She has a history of hypothyroidism for many years. It has been stable recently. Pt. denies any change in  voice, loss of hair, heat or cold intolerance. Energy level has been adequate to good. She denies constipation and diarrhea. No myxedema. Medication is as noted below. Verified that pt is taking it daily on an empty stomach. Well tolerated.  Patient states she quit taking her medicine for cholesterol because of multiple side effects including muscle aches.  History Lori [name] has a past medical history of Carotid artery stenosis; Hypertension; Hyperlipidemia; Thyroid disease; CAD (coronary artery disease); Arthritis; CHF (congestive heart failure); Respiratory failure; Pneumonia; Hyponatremia; and Renal insufficiency.   She has past surgical history that includes Carotid endarterectomy and Cardiac catheterization.   Her family history includes Coronary artery disease (age of  onset: 45) in her brother; Coronary artery disease (age of onset: 2) in her brother; Heart failure in her sister and sister; Heart failure (age of onset: 61) in her mother; Prostate cancer in an other family member; Rheum arthritis in her father.She reports that she has never smoked. She has never used smokeless tobacco. She reports that she does not drink alcohol or use illicit drugs.  Outpatient Prescriptions Prior to Visit  Medication Sig Dispense Refill  . amLODipine-olmesartan (AZOR) 5-20 MG per tablet Take 1 tablet by mouth one time daily as needed 30 tablet 4  . aspirin 81 MG tablet Take 81 mg by mouth daily.      . carvedilol (COREG) 3.125 MG tablet TAKE ONE TABLET BY MOUTH TWICE A DAY WITH MEALS 60 tablet 11  . clopidogrel (PLAVIX) 75 MG tablet TAKE ONE TABLET BY MOUTH ONE TIME DAILY 30 tablet 11  . furosemide (LASIX) 40 MG tablet TAKE ONE TABLET BY MOUTH ONE   TIME DAILY 30 tablet 11  . isosorbide mononitrate (IMDUR) 30 MG 24 hr tablet TAKE ONE HALF TABLET BY MOUTH EVERY DAY 30 tablet 5  . levothyroxine (SYNTHROID, LEVOTHROID) 50 MCG tablet TAKE ONE TABLET BY MOUTH ONE TIME DAILY BEFORE BREAKFAST 30 tablet 0  . nitroGLYCERIN (NITROSTAT) 0.4 MG SL tablet Place 1 tablet (0.4 mg total) under the tongue every 5 (five) minutes x 3 doses as needed for chest pain. (Patient not taking: Reported on 03/19/2015) 25 tablet 4  . ondansetron (ZOFRAN) 4 MG tablet Take 1 tablet (4 mg total) by mouth every 8 (eight) hours as needed for nausea or vomiting. (Patient not taking: Reported on 03/19/2015) 30 tablet 0  .  polyethylene glycol powder (GLYCOLAX/MIRALAX) powder Take 17 g by mouth daily. (Patient not taking: Reported on 03/19/2015)    . traMADol (ULTRAM) 50 MG tablet Take 1-2 tablets (50-100 mg total) by mouth every 6 (six) hours as needed (Pain). (Patient not taking: Reported on 03/19/2015) 30 tablet 0  . fluocinonide-emollient (LIDEX-E) 0.05 % cream Apply sparingly twice daily to affected rash (Patient  not taking: Reported on 03/19/2015) 30 g 1  . pantoprazole (PROTONIX) 40 MG tablet Take 1 tablet (40 mg total) by mouth daily. (Patient not taking: Reported on 03/19/2015) 30 tablet 2   No facility-administered medications prior to visit.    ROS Review of Systems  Constitutional: Negative for fever, chills, diaphoresis, appetite change and fatigue.  HENT: Positive for hearing loss. Negative for congestion, ear pain and trouble swallowing.   Eyes: Negative for pain.  Respiratory: Negative for cough, chest tightness and shortness of breath.   Cardiovascular: Negative for chest pain and palpitations.  Gastrointestinal: Negative for nausea, vomiting, abdominal pain, diarrhea and constipation.  Genitourinary: Positive for frequency. Negative for dysuria and menstrual problem.  Musculoskeletal: Negative for joint swelling and arthralgias.  Skin: Negative for rash.  Neurological: Negative for dizziness, numbness and headaches.  Psychiatric/Behavioral: Negative.     Objective:  BP 137/78 mmHg  Pulse 83  Temp(Src) 98 F (36.7 C) (Oral)  Ht 5' 2.5" (1.588 m)  Wt 139 lb (63.05 kg)  BMI 25.00 kg/m2  BP Readings from Last 3 Encounters:  03/19/15 137/78  12/11/14 118/60  10/01/14 116/59    Wt Readings from Last 3 Encounters:  03/19/15 139 lb (63.05 kg)  12/11/14 133 lb 6.4 oz (60.51 kg)  10/01/14 143 lb (64.864 kg)     Physical Exam  Constitutional: She is oriented to person, place, and time. She appears well-developed and well-nourished. No distress.  HENT:  Head: Normocephalic and atraumatic.  Eyes: Right eye exhibits no discharge. Left eye exhibits no discharge. No scleral icterus.  Neck: Normal range of motion. Neck supple. No thyromegaly present.  Cardiovascular: Normal rate, regular rhythm and normal heart sounds.   No murmur heard. Pulmonary/Chest: Effort normal and breath sounds normal. No respiratory distress. She has no wheezes. She has no rales.  Abdominal: Soft. Bowel  sounds are normal. She exhibits no distension. There is no tenderness.  Musculoskeletal:  Stooped posture  Neurological: She is alert and oriented to person, place, and time. She has normal reflexes.  Skin: Skin is warm and dry. Rash (: Typical erythema for stasis dermatitis on both lower extremities. The right lower extremity has more severe erythema spreading from excoriated central erythema.) noted.  Multiple healing bruises on forearms. Multiple seb K. Some actinic change as well  Psychiatric: She has a normal mood and affect. Her behavior is normal. Judgment and thought content normal.    No results found for: HGBA1C  Lab Results  Component Value Date   WBC 12.6* 12/11/2014   HGB 12.5 12/11/2014   HCT 37.5 12/11/2014   PLT 214.0 12/11/2014   GLUCOSE 103* 12/11/2014   CHOL * 08/06/2010    213        ATP III CLASSIFICATION:  <200     mg/dL   Desirable  200-239  mg/dL   Borderline High  >=240    mg/dL   High          TRIG 102 08/06/2010   HDL 49 08/06/2010   LDLCALC * 08/06/2010    144  Total Cholesterol/HDL:CHD Risk Coronary Heart Disease Risk Table                     Men   Women  1/2 Average Risk   3.4   3.3  Average Risk       5.0   4.4  2 X Average Risk   9.6   7.1  3 X Average Risk  23.4   11.0        Use the calculated Patient Ratio above and the CHD Risk Table to determine the patient's CHD Risk.        ATP III CLASSIFICATION (LDL):  <100     mg/dL   Optimal  100-129  mg/dL   Near or Above                    Optimal  130-159  mg/dL   Borderline  160-189  mg/dL   High  >190     mg/dL   Very High   ALT 10 12/11/2014   AST 13 12/11/2014   NA 128* 12/11/2014   K 4.7 12/11/2014   CL 95* 12/11/2014   CREATININE 1.32* 12/11/2014   BUN 39* 12/11/2014   CO2 29 12/11/2014   TSH 1.990 08/28/2013   INR 1.11 03/12/2012    Ct Abdomen Pelvis Wo Contrast  12/12/2014   CLINICAL DATA:  Mid to low abdominal pain.  EXAM: CT ABDOMEN AND PELVIS WITHOUT CONTRAST   TECHNIQUE: Multidetector CT imaging of the abdomen and pelvis was performed following the standard protocol without IV contrast.  COMPARISON:  08/04/2013.  FINDINGS: Lower chest:  There is no pleural effusion.  Lung bases appear clear  Hepatobiliary: There is no focal liver abnormality identified. The gallbladder is normal. No biliary dilatation.  Pancreas: Normal appearance of the pancreas.  Spleen: The spleen is unremarkable.  Adrenals/Urinary Tract: Normal adrenal glands. The kidneys are both on unremarkable. Normal appearance of the urinary bladder.  Stomach/Bowel: The stomach is normal. The small bowel loops have a normal course and caliber. No obstruction. Normal appearance of the proximal colon. There are numerous distal colonic diverticula without acute inflammation.  Vascular/Lymphatic: Calcified atherosclerotic disease involves the abdominal aorta. No aneurysm. Abdominal aortic ectasia measures up to 2.1 cm. No aneurysm. No upper abdominal adenopathy identified. There is no pelvic or inguinal adenopathy identified.  Reproductive: The uterus and the adnexal structures appear normal.  Other: There is no ascites or focal fluid collections within the abdomen or pelvis.  Musculoskeletal: Review of the visualized osseous structures is significant for osteopenia. There is sclerosis around the left SI joint. Multi level degenerative disc disease is noted within the lumbar spine. There is an anterolisthesis of L4 on L5 which measures 9 mm.  IMPRESSION: 1. No acute findings within the abdomen or pelvis. 2. Atherosclerotic disease. 3. Lumbar spondylosis noted.   Electronically Signed   By: Kerby Moors M.D.   On: 12/12/2014 09:24    Assessment & Plan:   Brian was seen today for rash and urinary frequency.  Diagnoses and all orders for this visit:  Urinary frequency Orders: -     POCT urinalysis dipstick -     POCT UA - Microscopic Only -     POCT CBC -     Urine culture  Hypothyroidism, unspecified  hypothyroidism type Orders: -     POCT CBC -     Lipase -     CMP14+EGFR -  TSH -     T4, free  Essential hypertension, benign Orders: -     POCT CBC -     Lipase -     CMP14+EGFR -     TSH -     T4, free  Stasis dermatitis of both legs Orders: -     fluocinonide-emollient (LIDEX-E) 0.05 % cream; Apply 1 application topically 2 (two) times daily.  Cellulitis of right lower extremity Orders: -     ciprofloxacin (CIPRO) 250 MG tablet; Take 1 tablet (250 mg total) by mouth 2 (two) times daily. For 1 week  Hard of hearing, bilateral   I have discontinued Ms. Pedraza's fluocinonide-emollient and pantoprazole. I am also having her start on ciprofloxacin and fluocinonide-emollient. Additionally, I am having her maintain her aspirin, nitroGLYCERIN, traMADol, polyethylene glycol powder, carvedilol, clopidogrel, furosemide, ondansetron, amLODipine-olmesartan, levothyroxine, and isosorbide mononitrate.  Meds ordered this encounter  Medications  . ciprofloxacin (CIPRO) 250 MG tablet    Sig: Take 1 tablet (250 mg total) by mouth 2 (two) times daily. For 1 week    Dispense:  14 tablet    Refill:  0  . fluocinonide-emollient (LIDEX-E) 0.05 % cream    Sig: Apply 1 application topically 2 (two) times daily.    Dispense:  60 g    Refill:  5     Follow-up: Return in about 3 months (around 06/19/2015), or if symptoms worsen or fail to improve.  Claretta Fraise, M.D.

## 2015-03-20 LAB — CMP14+EGFR
A/G RATIO: 1.9 (ref 1.1–2.5)
ALT: 5 IU/L (ref 0–32)
AST: 13 IU/L (ref 0–40)
Albumin: 3.8 g/dL (ref 3.2–4.6)
Alkaline Phosphatase: 76 IU/L (ref 39–117)
BILIRUBIN TOTAL: 0.3 mg/dL (ref 0.0–1.2)
BUN/Creatinine Ratio: 19 (ref 11–26)
BUN: 20 mg/dL (ref 10–36)
CHLORIDE: 97 mmol/L (ref 97–108)
CO2: 24 mmol/L (ref 18–29)
Calcium: 8.7 mg/dL (ref 8.7–10.3)
Creatinine, Ser: 1.05 mg/dL — ABNORMAL HIGH (ref 0.57–1.00)
GFR, EST AFRICAN AMERICAN: 53 mL/min/{1.73_m2} — AB (ref >59)
GFR, EST NON AFRICAN AMERICAN: 46 mL/min/{1.73_m2} — AB (ref >59)
GLUCOSE: 93 mg/dL (ref 65–99)
Globulin, Total: 2 g/dL (ref 1.5–4.5)
POTASSIUM: 5 mmol/L (ref 3.5–5.2)
SODIUM: 135 mmol/L (ref 134–144)
TOTAL PROTEIN: 5.8 g/dL — AB (ref 6.0–8.5)

## 2015-03-20 LAB — LIPASE: Lipase: 59 U/L (ref 0–59)

## 2015-03-20 LAB — T4, FREE: FREE T4: 1.38 ng/dL (ref 0.82–1.77)

## 2015-03-20 LAB — TSH: TSH: 2.18 u[IU]/mL (ref 0.450–4.500)

## 2015-03-21 ENCOUNTER — Other Ambulatory Visit: Payer: Self-pay

## 2015-03-21 ENCOUNTER — Telehealth: Payer: Self-pay | Admitting: *Deleted

## 2015-03-21 LAB — URINE CULTURE

## 2015-03-21 MED ORDER — TRIAMCINOLONE ACETONIDE 0.1 % EX CREA: 1.0000 "application " | TOPICAL_CREAM | Freq: Three times a day (TID) | CUTANEOUS | Status: DC

## 2015-03-21 NOTE — Telephone Encounter (Signed)
Lidex was given for stasis dermatitis and it is too expensive.  Do you have an alternative?

## 2015-03-21 NOTE — Telephone Encounter (Signed)
Triamcinolone prescription sent. He still the patient.

## 2015-03-22 NOTE — Telephone Encounter (Signed)
Patient aware.

## 2015-03-26 ENCOUNTER — Other Ambulatory Visit: Payer: Self-pay | Admitting: Nurse Practitioner

## 2015-03-28 ENCOUNTER — Telehealth: Payer: Self-pay | Admitting: *Deleted

## 2015-03-28 ENCOUNTER — Telehealth: Payer: Self-pay | Admitting: Cardiology

## 2015-03-28 MED ORDER — VALSARTAN 160 MG PO TABS: 160.0000 mg | ORAL_TABLET | Freq: Every day | ORAL | Status: DC | PRN

## 2015-03-28 NOTE — Telephone Encounter (Signed)
Called, goes to VM box not set up to receive msgs.

## 2015-03-28 NOTE — Telephone Encounter (Signed)
2nd attempt to reach caller.

## 2015-03-28 NOTE — Telephone Encounter (Signed)
Lori [name] - can you advise on recommendation for replacement for Azor 5-20?

## 2015-03-28 NOTE — Telephone Encounter (Signed)
Lori [name] spoke w/ patient - reports patient takes BP 3 times daily - no need for office BP check - pt takes Azor EOD - wrote for 90 tabs of valsartan 160mg  daily PRN w/ refill.

## 2015-03-28 NOTE — Telephone Encounter (Signed)
Pt can no longer affordAzor. Is there something else she can take that is less expensive?

## 2015-03-28 NOTE — Telephone Encounter (Signed)
refill 

## 2015-03-28 NOTE — Telephone Encounter (Signed)
Looks like patient isn't taking daily and all Epic BP readings have looked good.  For cost concerns would suggest she switch to valsartan 160 mg.  Doesn't have CCB, but she may not need 2 meds if only taking qod/prn.  Have her go to office for BP check after 2-3 weeks on new med (in [place])

## 2015-07-02 ENCOUNTER — Other Ambulatory Visit: Payer: Self-pay | Admitting: Family Medicine

## 2015-08-13 ENCOUNTER — Other Ambulatory Visit: Payer: Self-pay | Admitting: Cardiology

## 2015-08-24 ENCOUNTER — Other Ambulatory Visit: Payer: Self-pay | Admitting: Cardiology

## 2015-08-26 NOTE — Telephone Encounter (Signed)
REFILL 

## 2015-08-27 ENCOUNTER — Other Ambulatory Visit: Payer: Self-pay | Admitting: Cardiology

## 2015-09-13 ENCOUNTER — Other Ambulatory Visit: Payer: Self-pay | Admitting: Cardiology

## 2015-09-17 ENCOUNTER — Other Ambulatory Visit: Payer: Self-pay | Admitting: Cardiology

## 2015-09-17 MED ORDER — CLOPIDOGREL BISULFATE 75 MG PO TABS: 75.0000 mg | ORAL_TABLET | Freq: Every day | ORAL | Status: DC

## 2015-09-17 NOTE — Telephone Encounter (Signed)
E sent to pharmacy  [facility] CALLED

## 2015-09-17 NOTE — Telephone Encounter (Signed)
°*  STAT* If patient is at the pharmacy, call can be transferred to refill team.   1. Which medications need to be refilled? (please list name of each medication and dose if known) Clopidogrel    2. Which pharmacy/location (including street and city if local pharmacy) is medication to be sent to?[facility] in [place]. Do they need a 30 day or 90 day supply? [place]

## 2015-09-26 ENCOUNTER — Other Ambulatory Visit: Payer: Self-pay | Admitting: Cardiology

## 2015-09-26 NOTE — Telephone Encounter (Signed)
Rx request sent to pharmacy.  

## 2015-10-01 ENCOUNTER — Other Ambulatory Visit: Payer: Self-pay | Admitting: Pharmacist Clinician (PhC)/ Clinical Pharmacy Specialist

## 2015-10-01 NOTE — Telephone Encounter (Signed)
Rx request sent to pharmacy.  

## 2015-10-08 ENCOUNTER — Ambulatory Visit (INDEPENDENT_AMBULATORY_CARE_PROVIDER_SITE_OTHER): Payer: Medicare Other | Admitting: Family Medicine

## 2015-10-08 ENCOUNTER — Encounter: Payer: Self-pay | Admitting: Family Medicine

## 2015-10-08 VITALS — BP 184/74 | HR 84 | Temp 97.0°F | Ht 63.0 in | Wt 133.0 lb

## 2015-10-08 DIAGNOSIS — M47812 Spondylosis without myelopathy or radiculopathy, cervical region: Secondary | ICD-10-CM | POA: Diagnosis not present

## 2015-10-08 DIAGNOSIS — G9001 Carotid sinus syncope: Secondary | ICD-10-CM | POA: Diagnosis not present

## 2015-10-08 DIAGNOSIS — I2510 Atherosclerotic heart disease of native coronary artery without angina pectoris: Secondary | ICD-10-CM

## 2015-10-08 DIAGNOSIS — N3090 Cystitis, unspecified without hematuria: Secondary | ICD-10-CM | POA: Diagnosis not present

## 2015-10-08 DIAGNOSIS — R350 Frequency of micturition: Secondary | ICD-10-CM | POA: Diagnosis not present

## 2015-10-08 DIAGNOSIS — I10 Essential (primary) hypertension: Secondary | ICD-10-CM | POA: Diagnosis not present

## 2015-10-08 DIAGNOSIS — E039 Hypothyroidism, unspecified: Secondary | ICD-10-CM

## 2015-10-08 DIAGNOSIS — G450 Vertebro-basilar artery syndrome: Secondary | ICD-10-CM | POA: Insufficient documentation

## 2015-10-08 LAB — POCT UA - MICROSCOPIC ONLY
CASTS, UR, LPF, POC: NEGATIVE
CRYSTALS, UR, HPF, POC: NEGATIVE
Mucus, UA: NEGATIVE
YEAST UA: NEGATIVE

## 2015-10-08 LAB — POCT URINALYSIS DIPSTICK
BILIRUBIN UA: NEGATIVE
Glucose, UA: NEGATIVE
KETONES UA: NEGATIVE
Nitrite, UA: NEGATIVE
PH UA: 5
SPEC GRAV UA: 1.015
Urobilinogen, UA: NEGATIVE

## 2015-10-08 MED ORDER — SULFAMETHOXAZOLE-TRIMETHOPRIM 800-160 MG PO TABS: 1.0000 | ORAL_TABLET | Freq: Two times a day (BID) | ORAL | Status: DC

## 2015-10-08 MED ORDER — METOPROLOL SUCCINATE ER 25 MG PO TB24: 25.0000 mg | ORAL_TABLET | Freq: Every day | ORAL | Status: DC

## 2015-10-08 NOTE — Progress Notes (Signed)
Subjective:  Patient ID: Lori [name], female    DOB: [birth year]  Age: 82 y.o. MRN: [id number]  CC: Hypertension   HPI Lori [name] presents for  follow-up of hypertension. Patient has no history of headache chest pain or shortness of breath or recent cough. Patient also denies symptoms of TIA such as numbness weakness lateralizing. Patient checks  blood pressure at home and has not had any elevated readings recently. Patient denies side effects from his medication. States taking it regularly.  Patient states that she gets dizzy a lot. She relates it primarily when she turns her head to the side. Also if she bends her head backward. She has known arthritis of the neck as well. This dizziness gets better after a few moments particularly if she returns her head to neutral position. On a few occasions she has had near syncopal episode where vision went black but came back before she collapsed.  burning with urination and frequency for several days. Denies fever . No flank pain. No nausea, vomiting.   History Lori [name] has a past medical history of Carotid artery stenosis; Hypertension; Hyperlipidemia; Thyroid disease; CAD (coronary artery disease); Arthritis; CHF (congestive heart failure) ([place]); Respiratory failure ([place]); Pneumonia; Hyponatremia; and Renal insufficiency.   She has past surgical history that includes Carotid endarterectomy and Cardiac catheterization.   Her family history includes Coronary artery disease (age of onset: 38) in her brother; Coronary artery disease (age of onset: 18) in her brother; Heart failure in her sister and sister; Heart failure (age of onset: 93) in her mother; Rheum arthritis in her father.She reports that she has never smoked. She has never used smokeless tobacco. She reports that she does not drink alcohol or use illicit drugs.  Outpatient Prescriptions Prior to Visit  Medication Sig Dispense Refill  . aspirin 81 MG tablet Take 81 mg by mouth daily.       . clopidogrel (PLAVIX) 75 MG tablet Take 1 tablet (75 mg total) by mouth daily. 90 tablet 0  . furosemide (LASIX) 40 MG tablet TAKE ONE TABLET BY MOUTH ONE TIME DAILY 30 tablet 2  . isosorbide mononitrate (IMDUR) 30 MG 24 hr tablet TAKE ONE HALF TABLET BY MOUTH EVERY DAY 30 tablet 5  . levothyroxine (SYNTHROID, LEVOTHROID) 50 MCG tablet TAKE ONE TABLET BY MOUTH ONE TIME DAILY BEFORE BREAKFAST 30 tablet 11  . valsartan (DIOVAN) 160 MG tablet TAKE ONE TABLET BY MOUTH ONE TIME DAILY 30 tablet 2  . carvedilol (COREG) 3.125 MG tablet TAKE ONE TABLET BY MOUTH TWICE A DAY WITH MEALS 60 tablet 2  . ondansetron (ZOFRAN) 4 MG tablet Take 1 tablet (4 mg total) by mouth every 8 (eight) hours as needed for nausea or vomiting. 30 tablet 0  . nitroGLYCERIN (NITROSTAT) 0.4 MG SL tablet Place 1 tablet (0.4 mg total) under the tongue every 5 (five) minutes x 3 doses as needed for chest pain. (Patient not taking: Reported on 03/19/2015) 25 tablet 4  . polyethylene glycol powder (GLYCOLAX/MIRALAX) powder Take 17 g by mouth daily. (Patient not taking: Reported on 10/08/2015)    . ciprofloxacin (CIPRO) 250 MG tablet Take 1 tablet (250 mg total) by mouth 2 (two) times daily. For 1 week 14 tablet 0  . traMADol (ULTRAM) 50 MG tablet Take 1-2 tablets (50-100 mg total) by mouth every 6 (six) hours as needed (Pain). (Patient not taking: Reported on 03/19/2015) 30 tablet 0  . triamcinolone cream (KENALOG) 0.1 % APPLY 1 APPLICATION TOPICALLY 3 (THREE)  TIMES DAILY. TO AFFECTED AREAS  OF  LEGS 80 g 0   No facility-administered medications prior to visit.    ROS Review of Systems  Constitutional: Negative for fever, chills, diaphoresis, activity change and appetite change.  HENT: Negative for congestion, rhinorrhea and sore throat.   Eyes: Negative for visual disturbance.  Respiratory: Negative for cough and shortness of breath.   Cardiovascular: Negative for chest pain and palpitations.  Gastrointestinal: Negative for  nausea, abdominal pain, diarrhea and constipation.  Genitourinary: Positive for dysuria, urgency and frequency. Negative for hematuria, flank pain, decreased urine volume, menstrual problem and pelvic pain.  Musculoskeletal: Negative for myalgias, joint swelling and arthralgias.  Skin: Negative for rash.  Neurological: Negative for dizziness and numbness.    Objective:  BP 184/74 mmHg  Pulse 84  Temp(Src) 97 F (36.1 C) (Oral)  Ht '5\' 3"'$  (1.6 m)  Wt 133 lb (60.328 kg)  BMI 23.57 kg/m2  SpO2 96%  BP Readings from Last 3 Encounters:  10/08/15 184/74  03/19/15 137/78  12/11/14 118/60    Wt Readings from Last 3 Encounters:  10/08/15 133 lb (60.328 kg)  03/19/15 139 lb (63.05 kg)  12/11/14 133 lb 6.4 oz (60.51 kg)     Physical Exam  Constitutional: She is oriented to person, place, and time. She appears well-developed and well-nourished. No distress.  HENT:  Head: Normocephalic and atraumatic.  Right Ear: External ear normal.  Left Ear: External ear normal.  Nose: Nose normal.  Mouth/Throat: Oropharynx is clear and moist.  Eyes: Conjunctivae and EOM are normal. Pupils are equal, round, and reactive to light.  Neck: Normal range of motion. Neck supple. No thyromegaly present.  Cardiovascular: Normal rate, regular rhythm and normal heart sounds.   No murmur heard. Pulmonary/Chest: Effort normal and breath sounds normal. No respiratory distress. She has no wheezes. She has no rales.  Abdominal: Soft. Bowel sounds are normal. She exhibits no distension. There is no tenderness.  Lymphadenopathy:    She has no cervical adenopathy.  Neurological: She is alert and oriented to person, place, and time. She has normal reflexes.  Skin: Skin is warm and dry.  Psychiatric: She has a normal mood and affect. Her behavior is normal. Judgment and thought content normal.     Lab Results  Component Value Date   WBC 10.2 10/08/2015   HGB 11.0* 03/19/2015   HCT 36.6 10/08/2015   PLT 268  10/08/2015   GLUCOSE 101* 10/08/2015   CHOL * 08/06/2010    213        ATP III CLASSIFICATION:  <200     mg/dL   Desirable  200-239  mg/dL   Borderline High  >=240    mg/dL   High          TRIG 102 08/06/2010   HDL 49 08/06/2010   LDLCALC * 08/06/2010    144        Total Cholesterol/HDL:CHD Risk Coronary Heart Disease Risk Table                     Men   Women  1/2 Average Risk   3.4   3.3  Average Risk       5.0   4.4  2 X Average Risk   9.6   7.1  3 X Average Risk  23.4   11.0        Use the calculated Patient Ratio above and the CHD Risk Table to determine the patient's CHD  Risk.        ATP III CLASSIFICATION (LDL):  <100     mg/dL   Optimal  100-129  mg/dL   Near or Above                    Optimal  130-159  mg/dL   Borderline  160-189  mg/dL   High  >190     mg/dL   Very High   ALT 12 10/08/2015   AST 17 10/08/2015   NA 140 10/08/2015   K 5.6* 10/08/2015   CL 100 10/08/2015   CREATININE 1.08* 10/08/2015   BUN 25 10/08/2015   CO2 25 10/08/2015   TSH 1.990 10/08/2015   INR 1.11 03/12/2012   Results for orders placed or performed in visit on 10/08/15  CBC with Differential/Platelet  Result Value Ref Range   WBC 10.2 3.4 - 10.8 x10E3/uL   RBC 4.37 3.77 - 5.28 x10E6/uL   Hemoglobin 11.9 11.1 - 15.9 g/dL   Hematocrit 36.6 34.0 - 46.6 %   MCV 84 79 - 97 fL   MCH 27.2 26.6 - 33.0 pg   MCHC 32.5 31.5 - 35.7 g/dL   RDW 14.6 12.3 - 15.4 %   Platelets 268 150 - 379 x10E3/uL   Neutrophils 47 %   Lymphs 36 %   Monocytes 11 %   Eos 5 %   Basos 1 %   Neutrophils Absolute 4.9 1.4 - 7.0 x10E3/uL   Lymphocytes Absolute 3.6 (H) 0.7 - 3.1 x10E3/uL   Monocytes Absolute 1.1 (H) 0.1 - 0.9 x10E3/uL   EOS (ABSOLUTE) 0.5 (H) 0.0 - 0.4 x10E3/uL   Basophils Absolute 0.1 0.0 - 0.2 x10E3/uL   Immature Granulocytes 0 %   Immature Grans (Abs) 0.0 0.0 - 0.1 x10E3/uL  CMP14+EGFR  Result Value Ref Range   Glucose 101 (H) 65 - 99 mg/dL   BUN 25 10 - 36 mg/dL   Creatinine, Ser  1.08 (H) 0.57 - 1.00 mg/dL   GFR calc non Af Amer 44 (L) >59 mL/min/1.73   GFR calc Af Amer 51 (L) >59 mL/min/1.73   BUN/Creatinine Ratio 23 11 - 26   Sodium 140 134 - 144 mmol/L   Potassium 5.6 (H) 3.5 - 5.2 mmol/L   Chloride 100 96 - 106 mmol/L   CO2 25 18 - 29 mmol/L   Calcium 8.9 8.7 - 10.3 mg/dL   Total Protein 6.3 6.0 - 8.5 g/dL   Albumin 4.0 3.2 - 4.6 g/dL   Globulin, Total 2.3 1.5 - 4.5 g/dL   Albumin/Globulin Ratio 1.7 1.1 - 2.5   Bilirubin Total 0.3 0.0 - 1.2 mg/dL   Alkaline Phosphatase 78 39 - 117 IU/L   AST 17 0 - 40 IU/L   ALT 12 0 - 32 IU/L  TSH  Result Value Ref Range   TSH 1.990 0.450 - 4.500 uIU/mL  POCT urinalysis dipstick  Result Value Ref Range   Color, UA gold    Clarity, UA clear    Glucose, UA negative    Bilirubin, UA negative    Ketones, UA negative    Spec Grav, UA 1.015    Blood, UA trace    pH, UA 5.0    Protein, UA 2+    Urobilinogen, UA negative    Nitrite, UA negative    Leukocytes, UA large (3+) (A) Negative  POCT UA - Microscopic Only  Result Value Ref Range   WBC, Ur, HPF, POC 5-10  RBC, urine, microscopic occ    Bacteria, U Microscopic occ    Mucus, UA negative    Epithelial cells, urine per micros few    Crystals, Ur, HPF, POC negative    Casts, Ur, LPF, POC negative    Yeast, UA negative       Assessment & Plan:   Lori [name] was seen today for hypertension.  Diagnoses and all orders for this visit:  Cystitis  Urinary frequency -     POCT urinalysis dipstick -     POCT UA - Microscopic Only  Atherosclerosis of native coronary artery of native heart without angina pectoris -     CBC with Differential/Platelet  Accelerated hypertension -     CBC with Differential/Platelet -     CMP14+EGFR  Vertebrobasilar insufficiency -     CBC with Differential/Platelet -     Ambulatory referral to Neurology  Cervical spondylosis without myelopathy -     CBC with Differential/Platelet  Hypothyroidism, unspecified  hypothyroidism type -     TSH  Carotid sinus syncope  Other orders -     metoprolol succinate (TOPROL-XL) 25 MG 24 hr tablet; Take 1 tablet (25 mg total) by mouth daily. For heart and blood pressure -     sulfamethoxazole-trimethoprim (BACTRIM DS,SEPTRA DS) 800-160 MG tablet; Take 1 tablet by mouth 2 (two) times daily.   I have discontinued Lori [name] traMADol, ondansetron, ciprofloxacin, triamcinolone cream, and carvedilol. I am also having her start on metoprolol succinate and sulfamethoxazole-trimethoprim. Additionally, I am having her maintain her aspirin, nitroGLYCERIN, polyethylene glycol powder, isosorbide mononitrate, levothyroxine, clopidogrel, furosemide, and valsartan.  Meds ordered this encounter  Medications  . metoprolol succinate (TOPROL-XL) 25 MG 24 hr tablet    Sig: Take 1 tablet (25 mg total) by mouth daily. For heart and blood pressure    Dispense:  90 tablet    Refill:  3  . sulfamethoxazole-trimethoprim (BACTRIM DS,SEPTRA DS) 800-160 MG tablet    Sig: Take 1 tablet by mouth 2 (two) times daily.    Dispense:  14 tablet    Refill:  0     Follow-up: Return in about 1 month (around 11/08/2015), or if symptoms worsen or fail to improve.  Claretta Fraise, M.D.

## 2015-10-09 LAB — CBC WITH DIFFERENTIAL/PLATELET
BASOS: 1 %
Basophils Absolute: 0.1 10*3/uL (ref 0.0–0.2)
EOS (ABSOLUTE): 0.5 10*3/uL — ABNORMAL HIGH (ref 0.0–0.4)
EOS: 5 %
HEMATOCRIT: 36.6 % (ref 34.0–46.6)
Hemoglobin: 11.9 g/dL (ref 11.1–15.9)
Immature Grans (Abs): 0 10*3/uL (ref 0.0–0.1)
Immature Granulocytes: 0 %
LYMPHS ABS: 3.6 10*3/uL — AB (ref 0.7–3.1)
Lymphs: 36 %
MCH: 27.2 pg (ref 26.6–33.0)
MCHC: 32.5 g/dL (ref 31.5–35.7)
MCV: 84 fL (ref 79–97)
MONOS ABS: 1.1 10*3/uL — AB (ref 0.1–0.9)
Monocytes: 11 %
Neutrophils Absolute: 4.9 10*3/uL (ref 1.4–7.0)
Neutrophils: 47 %
Platelets: 268 10*3/uL (ref 150–379)
RBC: 4.37 x10E6/uL (ref 3.77–5.28)
RDW: 14.6 % (ref 12.3–15.4)
WBC: 10.2 10*3/uL (ref 3.4–10.8)

## 2015-10-09 LAB — CMP14+EGFR
A/G RATIO: 1.7 (ref 1.1–2.5)
ALK PHOS: 78 IU/L (ref 39–117)
ALT: 12 IU/L (ref 0–32)
AST: 17 IU/L (ref 0–40)
Albumin: 4 g/dL (ref 3.2–4.6)
BUN/Creatinine Ratio: 23 (ref 11–26)
BUN: 25 mg/dL (ref 10–36)
Bilirubin Total: 0.3 mg/dL (ref 0.0–1.2)
CO2: 25 mmol/L (ref 18–29)
Calcium: 8.9 mg/dL (ref 8.7–10.3)
Chloride: 100 mmol/L (ref 96–106)
Creatinine, Ser: 1.08 mg/dL — ABNORMAL HIGH (ref 0.57–1.00)
GFR calc Af Amer: 51 mL/min/{1.73_m2} — ABNORMAL LOW (ref >59)
GFR calc non Af Amer: 44 mL/min/{1.73_m2} — ABNORMAL LOW (ref >59)
GLOBULIN, TOTAL: 2.3 g/dL (ref 1.5–4.5)
Glucose: 101 mg/dL — ABNORMAL HIGH (ref 65–99)
POTASSIUM: 5.6 mmol/L — AB (ref 3.5–5.2)
SODIUM: 140 mmol/L (ref 134–144)
Total Protein: 6.3 g/dL (ref 6.0–8.5)

## 2015-10-09 LAB — TSH: TSH: 1.99 u[IU]/mL (ref 0.450–4.500)

## 2015-10-14 ENCOUNTER — Telehealth: Payer: Self-pay | Admitting: Family Medicine

## 2015-10-21 ENCOUNTER — Encounter: Payer: Self-pay | Admitting: Cardiology

## 2015-10-30 ENCOUNTER — Ambulatory Visit (INDEPENDENT_AMBULATORY_CARE_PROVIDER_SITE_OTHER): Payer: Medicare Other | Admitting: Cardiology

## 2015-10-30 ENCOUNTER — Encounter: Payer: Self-pay | Admitting: Cardiology

## 2015-10-30 VITALS — BP 141/58 | HR 74 | Ht 63.0 in | Wt 137.0 lb

## 2015-10-30 DIAGNOSIS — R55 Syncope and collapse: Secondary | ICD-10-CM | POA: Diagnosis not present

## 2015-10-30 DIAGNOSIS — I2510 Atherosclerotic heart disease of native coronary artery without angina pectoris: Secondary | ICD-10-CM

## 2015-10-30 NOTE — Progress Notes (Signed)
HPI The patient presents for evaluation of syncope.  Since I last saw her she had apparently a syncopal episode on December 24. She didn't go to the emergency room. She said she did fall. She was standing at that time. She says she didn't have prodrome. She's had a couple of other walls but without syncope. She does get dizzy. She says most of this is when she's lying down and turns her head in a certain way. There might be some mild orthostatic symptoms as well. She's not really describing palpitations. She does have some chest discomfort when she exerts herself but this has been a stable and exertional pattern only. She doesn't have any resting symptoms.ad rib fracture or pneumothorax.    No Known Allergies  Current Outpatient Prescriptions  Medication Sig Dispense Refill  . aspirin 81 MG tablet Take 81 mg by mouth daily.      . carvedilol (COREG) 3.125 MG tablet Take 3.125 mg by mouth 2 (two) times daily with a meal.    . clopidogrel (PLAVIX) 75 MG tablet Take 1 tablet (75 mg total) by mouth daily. 90 tablet 0  . furosemide (LASIX) 40 MG tablet TAKE ONE TABLET BY MOUTH ONE TIME DAILY 30 tablet 2  . isosorbide mononitrate (IMDUR) 30 MG 24 hr tablet TAKE ONE HALF TABLET BY MOUTH EVERY DAY 30 tablet 5  . levothyroxine (SYNTHROID, LEVOTHROID) 50 MCG tablet TAKE ONE TABLET BY MOUTH ONE TIME DAILY BEFORE BREAKFAST 30 tablet 11  . metoprolol succinate (TOPROL-XL) 25 MG 24 hr tablet Take 1 tablet (25 mg total) by mouth daily. For heart and blood pressure 90 tablet 3  . polyethylene glycol powder (GLYCOLAX/MIRALAX) powder Take 17 g by mouth daily.    . nitroGLYCERIN (NITROSTAT) 0.4 MG SL tablet Place 1 tablet (0.4 mg total) under the tongue every 5 (five) minutes x 3 doses as needed for chest pain. (Patient not taking: Reported on 03/19/2015) 25 tablet 4   No current facility-administered medications for this visit.    Past Medical History  Diagnosis Date  . Carotid artery stenosis     Left  carotid endarterectomy 02/2001  . Hypertension   . Hyperlipidemia   . Thyroid disease     Euthyroid 02/2012  . CAD (coronary artery disease)     a. NSTEMI in 2011 s/p PCI/DES to distal Cx, s/p PCI/DES to OM2 (EF 40%). b. NSTEMI 02/2012 treated conservatively.  . Arthritis   . CHF (congestive heart failure) ([place])     a. EF 40% in 2011. b. EF 35-40% by echo 03/12/12.  [name] Kitchen Respiratory failure ([place])     02/2012 - requiring bipap - multifactorial in setting of PNA/CHF/NSTEMI  . Pneumonia     02/2012  . Hyponatremia     Noted on 02/2012 admission  . Renal insufficiency     Noted on 02/2012 admission    Past Surgical History  Procedure Laterality Date  . Carotid endarterectomy      left 02/2001  . Cardiac catheterization      ROS  As stated in the HPI and negative for all other systems except  PHYSICAL EXAM BP 141/58 mmHg  Pulse 74  Ht 5\' 3"  (1.6 m)  Wt 137 lb (62.143 kg)  BMI 24.27 kg/m2  SpO2 99% NECK:  No jugular venous distention, waveform within normal limits, carotid upstroke brisk and symmetric, left bruit and CEA scar, no thyromegaly LUNGS:  Clear to auscultation bilaterally BACK:  No CVA tenderness CHEST:  Unremarkable HEART:  PMI not displaced or sustained,,S1 and S2 within normal limits, no S3, no S4, no clicks, no rubs, no murmurs ABD:  Flat, positive bowel sounds normal in frequency in pitch, no bruits, no rebound, no guarding, no midline pulsatile mass, no hepatomegaly, no splenomegaly EXT:  2 plus pulses upper, diminished bilateral DP/PT, mild right leg edema with some venous stasis changes, no cyanosis no clubbing SKIN:  Mild diffuse rash on her arms and upper chest  EKG:  NSR, rate 72, WNL, intervals WNL, no acute ST T wave changes.  PACs.  10/30/2015  ASSESSMENT AND PLAN  Coronary artery disease  She does have some stable exertional chest pain.[name] Kitchen  No change in therapy is indicated.   She and I previously had the discussion and I would like for her to remain on her  Plavix and ASA.  I think we should continue dual agents because she has had NSTEMI that we are managing medically. We discussed the risks benefits of stopping this medication if she does have some bruising.  Ischemic cardiomyopathy  She is euvolemic. We will continue with the meds as listed.   Chronic kidney disease  This is followed by Redge Gainer, MD  Hypertension  She stopped taking her Azor because of cough. Her medicines actually are fewer than she was on previously and I think this is reasonable given her advanced age.  Syncope It sounds like she had a syncopal episode. I'm going to put a 24-hour monitor on. I think some of her symptoms are vertiginous and less likely to be arrhythmogenic. However, we will evaluate with a monitor.

## 2015-10-30 NOTE — Patient Instructions (Signed)
Medication Instructions:  The current medical regimen is effective;  continue present plan and medications.  Testing/Procedures: Your physician has recommended that you wear a holter monitor for 24 hours. Holter monitors are medical devices that record the heart's electrical activity. Doctors most often use these monitors to diagnose arrhythmias. Arrhythmias are problems with the speed or rhythm of the heartbeat. The monitor is a small, portable device. You can wear one while you do your normal daily activities. This is usually used to diagnose what is causing palpitations/syncope (passing out).  Follow-Up: Follow up in 2 months with Dr Percival Spanish in [place].  If you need a refill on your cardiac medications before your next appointment, please call your pharmacy.  Thank you for choosing [facility]!!

## 2015-10-31 ENCOUNTER — Ambulatory Visit (INDEPENDENT_AMBULATORY_CARE_PROVIDER_SITE_OTHER): Payer: Medicare Other | Admitting: *Deleted

## 2015-10-31 ENCOUNTER — Encounter: Payer: Self-pay | Admitting: Neurology

## 2015-10-31 ENCOUNTER — Encounter: Payer: Self-pay | Admitting: Cardiology

## 2015-10-31 ENCOUNTER — Ambulatory Visit (INDEPENDENT_AMBULATORY_CARE_PROVIDER_SITE_OTHER): Payer: Medicare Other | Admitting: Neurology

## 2015-10-31 VITALS — BP 140/60 | HR 72 | Ht 66.0 in | Wt 135.0 lb

## 2015-10-31 DIAGNOSIS — I2510 Atherosclerotic heart disease of native coronary artery without angina pectoris: Secondary | ICD-10-CM | POA: Diagnosis not present

## 2015-10-31 DIAGNOSIS — R42 Dizziness and giddiness: Secondary | ICD-10-CM | POA: Diagnosis not present

## 2015-10-31 DIAGNOSIS — R55 Syncope and collapse: Secondary | ICD-10-CM | POA: Diagnosis not present

## 2015-10-31 NOTE — Progress Notes (Signed)
Pt came in today for 24 hour holter monitor per Dr.Hochrein.

## 2015-10-31 NOTE — Progress Notes (Signed)
NEUROLOGY CONSULTATION NOTE  Lori [name] MRN: [id number] DOB: 11/12/1918  Referring provider: Dr. Livia Snellen Primary care provider: Dr. Laurance Flatten  Reason for consult:  Vertebrobasilar insufficiency  HISTORY OF PRESENT ILLNESS: Lori [name] is a 82 year old right-handed female with hypertension, hypothyroidism and cervical arthritis who presents for vertebrobasilar insufficiency.  History obtained by patient, her daughter, cardiology note and PCP note.    On 09/14/15, she was standing when suddenly she felt lightheaded and just dropped to the floor.  She thinks she may have passed out.  She got up and went to bed.  In bed, she experienced spinning sensation triggered by head movement but lasting only a brief time.  It would subside if she stayed still.  Since then, she has had recurrent spells.  It is difficult for her to elaborate.  She reports suddenly experiencing a feeling or heaviness over her head and lightheadedness, as if she is going to pass out.  It occurs usually when just laying in bed.  She denies spinning sensation with these spells.  She denies double vision, slurred speech, facial droop, focal numbness and weakness or headache. She notes some chest discomfort.  She has prior history of vertigo in the past, but this is different.  She takes ASA 81mg  daily and Plavix 75mg  daily.   She has multilevel degeneratic disc disease involving the cervical spine, as demonstrated on CT from November 2014.  CT of head at that time revealed no significant chronic white matter disease.  PAST MEDICAL HISTORY: Past Medical History  Diagnosis Date  . Carotid artery stenosis     Left carotid endarterectomy 02/2001  . Hypertension   . Hyperlipidemia   . Thyroid disease     Euthyroid 02/2012  . CAD (coronary artery disease)     a. NSTEMI in 2011 s/p PCI/DES to distal Cx, s/p PCI/DES to OM2 (EF 40%). b. NSTEMI 02/2012 treated conservatively.  . Arthritis   . CHF (congestive heart failure)  ([place])     a. EF 40% in 2011. b. EF 35-40% by echo 03/12/12.  [name] Kitchen Respiratory failure ([place])     02/2012 - requiring bipap - multifactorial in setting of PNA/CHF/NSTEMI  . Pneumonia     02/2012  . Hyponatremia     Noted on 02/2012 admission  . Renal insufficiency     Noted on 02/2012 admission    PAST SURGICAL HISTORY: Past Surgical History  Procedure Laterality Date  . Carotid endarterectomy      left 02/2001  . Cardiac catheterization      MEDICATIONS: Current Outpatient Prescriptions on File Prior to Visit  Medication Sig Dispense Refill  . aspirin 81 MG tablet Take 81 mg by mouth daily.      . carvedilol (COREG) 3.125 MG tablet Take 3.125 mg by mouth 2 (two) times daily with a meal.    . clopidogrel (PLAVIX) 75 MG tablet Take 1 tablet (75 mg total) by mouth daily. 90 tablet 0  . furosemide (LASIX) 40 MG tablet TAKE ONE TABLET BY MOUTH ONE TIME DAILY 30 tablet 2  . isosorbide mononitrate (IMDUR) 30 MG 24 hr tablet TAKE ONE HALF TABLET BY MOUTH EVERY DAY 30 tablet 5  . levothyroxine (SYNTHROID, LEVOTHROID) 50 MCG tablet TAKE ONE TABLET BY MOUTH ONE TIME DAILY BEFORE BREAKFAST 30 tablet 11  . metoprolol succinate (TOPROL-XL) 25 MG 24 hr tablet Take 1 tablet (25 mg total) by mouth daily. For heart and blood pressure 90 tablet 3  . polyethylene glycol  powder (GLYCOLAX/MIRALAX) powder Take 17 g by mouth daily.    . nitroGLYCERIN (NITROSTAT) 0.4 MG SL tablet Place 1 tablet (0.4 mg total) under the tongue every 5 (five) minutes x 3 doses as needed for chest pain. (Patient not taking: Reported on 03/19/2015) 25 tablet 4   No current facility-administered medications on file prior to visit.    ALLERGIES: No Known Allergies  FAMILY HISTORY: Family History  Problem Relation Age of Onset  . Prostate cancer    . Heart failure Mother 64  . Coronary artery disease Brother 61    CABG, alive at 56  . Coronary artery disease Brother 30    Died  . Heart failure Sister   . Heart failure Sister     . Rheum arthritis Father     prostate    SOCIAL HISTORY: Social History   Social History  . Marital Status: Married    Spouse Name: N/A  . Number of Children: N/A  . Years of Education: N/A   Occupational History  . RETIRED     [employer]   Social History Main Topics  . Smoking status: Never Smoker   . Smokeless tobacco: Never Used  . Alcohol Use: No  . Drug Use: No  . Sexual Activity: Not Currently   Other Topics Concern  . Not on file   Social History Narrative   Lives at home with husband.      REVIEW OF SYSTEMS: Constitutional: No fevers, chills, or sweats, no generalized fatigue, change in appetite Eyes: No visual changes, double vision, eye pain Ear, nose and throat: No hearing loss, ear pain, nasal congestion, sore throat Cardiovascular: No chest pain, palpitations Respiratory:  No shortness of breath at rest or with exertion, wheezes GastrointestinaI: No nausea, vomiting, diarrhea, abdominal pain, fecal incontinence Genitourinary:  No dysuria, urinary retention or frequency Musculoskeletal:  No neck pain, back pain Integumentary: weeping rash on legs Neurological: as above Psychiatric: No depression, insomnia, anxiety Endocrine: No palpitations, fatigue, diaphoresis, mood swings, change in appetite, change in weight, increased thirst Hematologic/Lymphatic:  No anemia, purpura, petechiae. Allergic/Immunologic: no itchy/runny eyes, nasal congestion, recent allergic reactions, rashes  PHYSICAL EXAM: Filed Vitals:   10/31/15 1222  BP: 140/60  Pulse: 72   General: No acute distress.  Patient appears well-groomed.  Head:  Normocephalic/atraumatic Eyes:  fundi unremarkable, without vessel changes, exudates, hemorrhages or papilledema. Neck: supple, no paraspinal tenderness, full range of motion Back: No paraspinal tenderness Heart: regular rate and rhythm Lungs: Clear to auscultation bilaterally. Vascular: No carotid bruits. Neurological  Exam: Mental status: alert and oriented to person, place, and time, recent and remote memory intact, fund of knowledge intact, attention and concentration intact, speech fluent and not dysarthric, language intact. Cranial nerves: CN I: not tested CN II: pupils equal, round and reactive to light, visual fields intact, fundi unremarkable, without vessel changes, exudates, hemorrhages or papilledema. CN III, IV, VI:  full range of motion, no nystagmus, no ptosis CN V: facial sensation intact CN VII: upper and lower face symmetric CN VIII: hearing intact CN IX, X: gag intact, uvula midline CN XI: sternocleidomastoid and trapezius muscles intact CN XII: tongue midline Bulk & Tone: normal, no fasciculations. Motor:  5/5 throughout Sensation:  Decreased temperature and vibration sensation in lower extremities. Deep Tendon Reflexes:  trace throughout, except absent in ankles, toes downgoing.  Finger to nose testing:  Without dysmetria.  Heel to shin:  Without dysmetria.  Gait:  Cautious, wide-based.  Not ataxic.  Unable to tandem walk.  Romberg negative  IMPRESSION: The event in December sounds like syncope.  She exhibited pure positional vertigo afterwards, which may have been triggered by the fall Recurrent near-syncopal spells.  She is adamant to me that she does not have any sense of spinning or movement with these spells.  Rather, it is a sense of heaviness and lightheadedness.  PLAN: 1.  We will get CT and CTA of head to assess for vertebrobasilar insufficiency, since that is the specific question.  This is being done to help get an answer, however, I told them that it will not change management. 2.  She is already on Plavix 3.  She has an upcoming 24 hour monitor with cardiology 4.  Will contact patient or daughter with results and further recommendations  Thank you for allowing me to take part in the care of this patient.  Metta Clines, DO  CC:  Claretta Fraise, MD  Redge Gainer,  MD

## 2015-10-31 NOTE — Patient Instructions (Signed)
1.  We will get CT and CTA of head to assess for stroke or blockage of arteries in the head.  It would not change management, however, but rather help rule in or out diagnoses 2.  Will contact you with results

## 2015-11-01 DIAGNOSIS — R42 Dizziness and giddiness: Secondary | ICD-10-CM | POA: Diagnosis not present

## 2015-11-11 ENCOUNTER — Other Ambulatory Visit: Payer: Medicare Other

## 2015-12-03 ENCOUNTER — Other Ambulatory Visit: Payer: Self-pay | Admitting: *Deleted

## 2015-12-03 MED ORDER — FUROSEMIDE 40 MG PO TABS: 40.0000 mg | ORAL_TABLET | Freq: Every day | ORAL | Status: DC

## 2015-12-03 NOTE — Telephone Encounter (Signed)
REFILL 

## 2015-12-10 ENCOUNTER — Telehealth: Payer: Self-pay | Admitting: *Deleted

## 2015-12-10 NOTE — Telephone Encounter (Signed)
Spoke with pt about her monitor result, pt voice understanding.

## 2015-12-19 ENCOUNTER — Other Ambulatory Visit: Payer: Self-pay | Admitting: *Deleted

## 2015-12-19 MED ORDER — CLOPIDOGREL BISULFATE 75 MG PO TABS: 75.0000 mg | ORAL_TABLET | Freq: Every day | ORAL | Status: DC

## 2015-12-24 NOTE — Progress Notes (Signed)
HPI The patient presents for evaluation of syncope.  Since I last saw her she had apparently a syncopal episode on December 24. She didn't go to the emergency room.   This event is described in a previous note.  Since that time she has had no further syncope or presyncope.  The patient denies any new symptoms such as chest discomfort, neck or arm discomfort. There has been no new shortness of breath, PND or orthopnea. There have been no reported palpitations, presyncope or syncope.  She did see a neurologist.  There was consideration of having a CT and CTA to rule out vertebrobasilar insufficiency but the patient decided not to have this study. She gets around slowly with a cane.  Of note she did wear a Holter monitor. I reviewed this with her today. There were no bradycardia arrhythmias to explain any syncope.   No Known Allergies  Current Outpatient Prescriptions  Medication Sig Dispense Refill  . aspirin 81 MG tablet Take 81 mg by mouth daily.      . carvedilol (COREG) 3.125 MG tablet Take 3.125 mg by mouth 2 (two) times daily with a meal.    . clopidogrel (PLAVIX) 75 MG tablet Take 1 tablet (75 mg total) by mouth daily. 90 tablet 0  . furosemide (LASIX) 40 MG tablet Take 1 tablet (40 mg total) by mouth daily. 30 tablet 0  . isosorbide mononitrate (IMDUR) 30 MG 24 hr tablet TAKE ONE HALF TABLET BY MOUTH EVERY DAY 30 tablet 5  . levothyroxine (SYNTHROID, LEVOTHROID) 50 MCG tablet TAKE ONE TABLET BY MOUTH ONE TIME DAILY BEFORE BREAKFAST 30 tablet 11  . metoprolol succinate (TOPROL-XL) 25 MG 24 hr tablet Take 1 tablet (25 mg total) by mouth daily. For heart and blood pressure 90 tablet 3  . polyethylene glycol powder (GLYCOLAX/MIRALAX) powder Take 17 g by mouth daily.    . nitroGLYCERIN (NITROSTAT) 0.4 MG SL tablet Place 1 tablet (0.4 mg total) under the tongue every 5 (five) minutes x 3 doses as needed for chest pain. (Patient not taking: Reported on 03/19/2015) 25 tablet 4   No current  facility-administered medications for this visit.    Past Medical History  Diagnosis Date  . Carotid artery stenosis     Left carotid endarterectomy 02/2001  . Hypertension   . Hyperlipidemia   . Thyroid disease     Euthyroid 02/2012  . CAD (coronary artery disease)     a. NSTEMI in 2011 s/p PCI/DES to distal Cx, s/p PCI/DES to OM2 (EF 40%). b. NSTEMI 02/2012 treated conservatively.  . Arthritis   . CHF (congestive heart failure) ([place])     a. EF 40% in 2011. b. EF 35-40% by echo 03/12/12.  [name] Kitchen Respiratory failure ([place])     02/2012 - requiring bipap - multifactorial in setting of PNA/CHF/NSTEMI  . Pneumonia     02/2012  . Hyponatremia     Noted on 02/2012 admission  . Renal insufficiency     Noted on 02/2012 admission    Past Surgical History  Procedure Laterality Date  . Carotid endarterectomy      left 02/2001  . Cardiac catheterization      ROS  As stated in the HPI and negative for all other systems except  PHYSICAL EXAM BP 150/60 mmHg  Pulse 78  Ht 5\' 4"  (1.626 m)  Wt 135 lb (61.236 kg)  BMI 23.16 kg/m2 NECK:  No jugular venous distention, waveform within normal limits, carotid upstroke brisk and symmetric,  left bruit and CEA scar, no thyromegaly LUNGS:  Clear to auscultation bilaterally BACK:  No CVA tenderness CHEST:  Unremarkable HEART:  PMI not displaced or sustained,,S1 and S2 within normal limits, no S3, no S4, no clicks, no rubs, no murmurs ABD:  Flat, positive bowel sounds normal in frequency in pitch, no bruits, no rebound, no guarding, no midline pulsatile mass, no hepatomegaly, no splenomegaly EXT:  2 plus pulses upper, diminished bilateral DP/PT, mild right leg edema with some venous stasis changes, no cyanosis no clubbing SKIN:  Mild diffuse rash on her arms and upper chest   ASSESSMENT AND PLAN  Coronary artery disease  She does have some stable exertional chest pain.[name] Kitchen  However, this is rare. No change in therapy is indicated.   She and I previously had  the discussion and I would like for her to remain on her Plavix and ASA.  I think we should continue dual agents because she has had NSTEMI that we are managing medically. We discussed the risks benefits of stopping this medication if she does have some bruising.  Ischemic cardiomyopathy  She is euvolemic. We will continue with the meds as listed.   Chronic kidney disease  This is followed by Redge Gainer, MD  Hypertension  Her blood pressure is slightly elevated but I'm going to actually allow for some permissive hypertension given her syncope and advanced age.  Syncope The etiology of this was not clear though possibly vagal. No further workup or change in therapy is indicated.

## 2015-12-25 ENCOUNTER — Encounter: Payer: Self-pay | Admitting: Cardiology

## 2015-12-25 ENCOUNTER — Ambulatory Visit (INDEPENDENT_AMBULATORY_CARE_PROVIDER_SITE_OTHER): Payer: Medicare Other | Admitting: Cardiology

## 2015-12-25 VITALS — BP 150/60 | HR 78 | Ht 64.0 in | Wt 135.0 lb

## 2015-12-25 DIAGNOSIS — R55 Syncope and collapse: Secondary | ICD-10-CM

## 2015-12-25 DIAGNOSIS — I2510 Atherosclerotic heart disease of native coronary artery without angina pectoris: Secondary | ICD-10-CM

## 2015-12-25 MED ORDER — TRIAMCINOLONE ACETONIDE 0.1 % EX CREA: 1.0000 "application " | TOPICAL_CREAM | Freq: Two times a day (BID) | CUTANEOUS | Status: DC

## 2015-12-25 NOTE — Patient Instructions (Signed)
Medication Instructions:  The current medical regimen is effective;  continue present plan and medications.  Follow-Up: Follow up in 6 months with Dr. Hochrein.  You will receive a letter in the mail 2 months before you are due.  Please call us when you receive this letter to schedule your follow up appointment.  If you need a refill on your cardiac medications before your next appointment, please call your pharmacy.  Thank you for choosing [facility]!!       

## 2016-01-09 DIAGNOSIS — X32XXXD Exposure to sunlight, subsequent encounter: Secondary | ICD-10-CM | POA: Diagnosis not present

## 2016-01-09 DIAGNOSIS — L820 Inflamed seborrheic keratosis: Secondary | ICD-10-CM | POA: Diagnosis not present

## 2016-01-09 DIAGNOSIS — L570 Actinic keratosis: Secondary | ICD-10-CM | POA: Diagnosis not present

## 2016-01-24 ENCOUNTER — Other Ambulatory Visit: Payer: Self-pay | Admitting: Cardiology

## 2016-01-24 NOTE — Telephone Encounter (Signed)
Rx Refill

## 2016-02-05 ENCOUNTER — Other Ambulatory Visit: Payer: Self-pay

## 2016-02-05 MED ORDER — CARVEDILOL 3.125 MG PO TABS: 3.1250 mg | ORAL_TABLET | Freq: Two times a day (BID) | ORAL | Status: DC

## 2016-02-13 ENCOUNTER — Other Ambulatory Visit: Payer: Self-pay | Admitting: Cardiology

## 2016-03-17 ENCOUNTER — Other Ambulatory Visit: Payer: Self-pay | Admitting: Cardiology

## 2016-03-19 ENCOUNTER — Ambulatory Visit (INDEPENDENT_AMBULATORY_CARE_PROVIDER_SITE_OTHER): Payer: Medicare Other | Admitting: Pediatrics

## 2016-03-19 ENCOUNTER — Encounter: Payer: Self-pay | Admitting: Pediatrics

## 2016-03-19 VITALS — BP 140/60 | HR 78 | Temp 97.3°F | Ht 64.0 in | Wt 133.0 lb

## 2016-03-19 DIAGNOSIS — I5022 Chronic systolic (congestive) heart failure: Secondary | ICD-10-CM | POA: Diagnosis not present

## 2016-03-19 DIAGNOSIS — I872 Venous insufficiency (chronic) (peripheral): Secondary | ICD-10-CM | POA: Diagnosis not present

## 2016-03-19 DIAGNOSIS — I2510 Atherosclerotic heart disease of native coronary artery without angina pectoris: Secondary | ICD-10-CM | POA: Diagnosis not present

## 2016-03-19 DIAGNOSIS — I10 Essential (primary) hypertension: Secondary | ICD-10-CM | POA: Diagnosis not present

## 2016-03-19 NOTE — Patient Instructions (Addendum)
Use gentle soap to clean shins such as cetaphil  Use non-fragrance moisturizer twice a day to legs such as ceravae, eucerin, vasoline, aquaphor.  Keep legs elevated as much as you can.  If swelling increases let me know.  If legs start to hurt or get more red or appear infected, let me know.  Use compression hose during day when up. Take off every night.

## 2016-03-19 NOTE — Progress Notes (Signed)
    Subjective:    Patient ID: Lori [name], female    DOB: 02-28-1919, 82 y.o.   MRN: [id number]  CC: Leg Pain   HPI: Lori [name] is a 82 y.o. female presenting for Leg Pain  Past few weeks has had itching, red shins b/l.  Does not hurt Itches a lot Same on both shins Putting moisturizer on it, helps some No pain in legs or feet No increase in swelling, though says usually leg swelling is worse in hot weather Legs are always very small in morning when she wakes up    Depression screen [facility] 2/9 03/19/2016 10/08/2015 03/19/2015 10/01/2014 09/06/2014  Decreased Interest 0 0 0 0 0  Down, Depressed, Hopeless 0 0 0 0 0  PHQ - 2 Score 0 0 0 0 0     Relevant past medical, surgical, family and social history reviewed and updated as indicated.  Interim medical history since our last visit reviewed. Allergies and medications reviewed and updated.  ROS: Per HPI unless specifically indicated above  History  Smoking status  . Never Smoker   Smokeless tobacco  . Never Used       Objective:    BP 160/76 mmHg, 140/60 at recheck  Pulse 78  Temp(Src) 97.3 F (36.3 C) (Oral)  Ht 5\' 4"  (1.626 m)  Wt 133 lb (60.328 kg)  BMI 22.82 kg/m2  Wt Readings from Last 3 Encounters:  03/19/16 133 lb (60.328 kg)  12/25/15 135 lb (61.236 kg)  10/31/15 135 lb (61.236 kg)     Gen: NAD, alert, cooperative with exam, NCAT EYES: EOMI, no scleral injection or icterus CV: distal pulses 2+ b/l Resp: CTABL, no wheezes, normal WOB Ext: No edema, warm Neuro: Alert and oriented, strength equal b/l UE and LE, coordination grossly normal Skin: b/l anterior shins with excoriations b/l, minimal surrounding redness. No crusting or discharge.     Assessment & Plan:    Hadia was seen today for leg rash due to venous insufficiency.  Diagnoses and all orders for this visit:  Venous (peripheral) insufficiency Discussed symptomatic care, elevate legs, compression hose, emollient use, gentle  cleanser. Do not appear infected today. Return precautions given.  Accelerated hypertension BP 140/60 at recheck Cont current meds Cardiology following, ok with permissive hypertension given age and h/o syncope  Chronic systolic heart failure ([place]) H/o ischemic cardiomyopathy Euvolemic today On beta blocker, imdur, ASA.   Follow up plan: Return in about 3 months (around 06/19/2016) for med follow up.  Assunta Found, MD [facility] 03/19/2016, 11:46 AM

## 2016-04-01 ENCOUNTER — Other Ambulatory Visit: Payer: Self-pay | Admitting: Family Medicine

## 2016-04-27 DIAGNOSIS — R112 Nausea with vomiting, unspecified: Secondary | ICD-10-CM | POA: Diagnosis not present

## 2016-04-27 DIAGNOSIS — Z79899 Other long term (current) drug therapy: Secondary | ICD-10-CM | POA: Diagnosis not present

## 2016-04-27 DIAGNOSIS — R109 Unspecified abdominal pain: Secondary | ICD-10-CM | POA: Diagnosis not present

## 2016-04-27 DIAGNOSIS — Z7982 Long term (current) use of aspirin: Secondary | ICD-10-CM | POA: Diagnosis not present

## 2016-04-27 DIAGNOSIS — I10 Essential (primary) hypertension: Secondary | ICD-10-CM | POA: Diagnosis not present

## 2016-04-27 DIAGNOSIS — N390 Urinary tract infection, site not specified: Secondary | ICD-10-CM | POA: Diagnosis not present

## 2016-04-27 DIAGNOSIS — I252 Old myocardial infarction: Secondary | ICD-10-CM | POA: Diagnosis not present

## 2016-04-27 DIAGNOSIS — R1012 Left upper quadrant pain: Secondary | ICD-10-CM | POA: Diagnosis not present

## 2016-04-27 DIAGNOSIS — K859 Acute pancreatitis without necrosis or infection, unspecified: Secondary | ICD-10-CM | POA: Diagnosis not present

## 2016-04-27 DIAGNOSIS — E7800 Pure hypercholesterolemia, unspecified: Secondary | ICD-10-CM | POA: Diagnosis not present

## 2016-05-01 ENCOUNTER — Telehealth: Payer: Self-pay | Admitting: Cardiology

## 2016-05-01 MED ORDER — ISOSORBIDE MONONITRATE ER 30 MG PO TB24
15.0000 mg | ORAL_TABLET | Freq: Every day | ORAL | 1 refills | Status: DC
Start: 2016-05-01 — End: 2016-07-01

## 2016-05-01 NOTE — Telephone Encounter (Signed)
Called patient and informed her I sent Rx in to preferred pharmacy - she voiced acknowledgment.

## 2016-05-01 NOTE — Telephone Encounter (Signed)
Per pt's daughter's call:   Pt has no more  ISOSORBIDE  MONONITRATE (IMDUR)         And needs it called in ASAP PLEASE.  Pt is out of it.  Per daughter Lori [name] in [place]

## 2016-05-08 ENCOUNTER — Other Ambulatory Visit: Payer: Self-pay | Admitting: *Deleted

## 2016-05-08 MED ORDER — CLOPIDOGREL BISULFATE 75 MG PO TABS: 75.0000 mg | ORAL_TABLET | Freq: Every day | ORAL | 0 refills | Status: DC

## 2016-05-11 ENCOUNTER — Other Ambulatory Visit: Payer: Self-pay | Admitting: *Deleted

## 2016-05-11 MED ORDER — CARVEDILOL 3.125 MG PO TABS: 3.1250 mg | ORAL_TABLET | Freq: Two times a day (BID) | ORAL | 2 refills | Status: DC

## 2016-05-11 NOTE — Telephone Encounter (Signed)
Rx request sent to pharmacy.  

## 2016-05-11 NOTE — Telephone Encounter (Signed)
Per patients daughter's Kathrine Cords) request. Last refilled by Dr Livia Snellen, but per [name], Dr Percival Spanish started patient on this medication. If there is any problem or if she needs to call Dr Livia Snellen please let her know at [phone]. Thanks, MI

## 2016-05-12 ENCOUNTER — Other Ambulatory Visit: Payer: Self-pay

## 2016-05-12 ENCOUNTER — Telehealth: Payer: Self-pay | Admitting: Family Medicine

## 2016-05-12 ENCOUNTER — Telehealth: Payer: Self-pay

## 2016-05-12 MED ORDER — CARVEDILOL 3.125 MG PO TABS: 3.1250 mg | ORAL_TABLET | Freq: Two times a day (BID) | ORAL | 2 refills | Status: DC

## 2016-05-12 NOTE — Telephone Encounter (Signed)
Call given to nurse °

## 2016-05-12 NOTE — Telephone Encounter (Signed)
Received call from [place] stating that patient was taking carvedilol 3.125 BID and also  Metoprolol 25 1 QD. Should she be taking both? Please advise and route to Pool A to contact pharmacy

## 2016-05-12 NOTE — Telephone Encounter (Signed)
Good question, I cannot see clearly in the chart which is preferred and I have never actually seen her.   Will ask nursing to call and discuss, if there is any doubt will ask pt to come in for med rec with nursing tomorrow.   Laroy Apple, MD [facility] 05/12/2016, 5:09 PM

## 2016-05-13 NOTE — Telephone Encounter (Signed)
Metoprolol and carvedilol were in question per pharmacy. Patient taking both?

## 2016-05-13 NOTE — Telephone Encounter (Signed)
Patient says she will bring all medications to office for review.

## 2016-05-15 ENCOUNTER — Telehealth: Payer: Self-pay

## 2016-05-15 NOTE — Telephone Encounter (Signed)
Just FYI: [facility] called back to let us know that patient filled both rxs the coreg and metoprolol. Advised pharmacy that nurse had contacted patient and she was asked to bring all medicines by the office for review. Pharmacist just wanted to let us know that patient did pick up both.

## 2016-05-15 NOTE — Telephone Encounter (Signed)
Thank you. Lets have her take only metoprolol until she can follow up with one of [place] in the  Next week or two.    Laroy Apple, MD [facility] 05/15/2016, 12:41 PM

## 2016-06-18 NOTE — Telephone Encounter (Signed)
Pharmacy aware

## 2016-06-24 ENCOUNTER — Encounter: Payer: Self-pay | Admitting: Cardiology

## 2016-07-01 ENCOUNTER — Other Ambulatory Visit: Payer: Self-pay | Admitting: Cardiology

## 2016-07-07 NOTE — Progress Notes (Signed)
HPIhas The patient presents for evaluation of syncope.  This happened in Dec of last year.  She didn't go to the emergency room.   This event is described in a previous note.  She did see a neurologist.  There was consideration of having a CT and CTA to rule out vertebrobasilar insufficiency but the patient decided not to have this study. Since that time she has had no further syncope.  She denies any cardiovascular symptoms.  The patient denies any new symptoms such as chest discomfort, neck or arm discomfort. There has been no new shortness of breath, PND or orthopnea. There have been no reported palpitations, presyncope or syncope.  No Known Allergies  Current Outpatient Prescriptions  Medication Sig Dispense Refill  . aspirin 81 MG tablet Take 81 mg by mouth daily.      . carvedilol (COREG) 3.125 MG tablet Take 1 tablet (3.125 mg total) by mouth 2 (two) times daily with a meal. 60 tablet 2  . clopidogrel (PLAVIX) 75 MG tablet Take 1 tablet (75 mg total) by mouth daily. 90 tablet 3  . furosemide (LASIX) 40 MG tablet Take 1 tablet (40 mg total) by mouth daily. 90 tablet 3  . isosorbide mononitrate (IMDUR) 30 MG 24 hr tablet Take 0.5 tablets (15 mg total) by mouth daily. 45 tablet 3  . levothyroxine (SYNTHROID, LEVOTHROID) 50 MCG tablet TAKE ONE TABLET BY MOUTH ONCE DAILY BEFORE BREAKFAST 30 tablet 5  . metoprolol succinate (TOPROL-XL) 25 MG 24 hr tablet Take 1 tablet (25 mg total) by mouth daily. For heart and blood pressure 90 tablet 3  . polyethylene glycol powder (GLYCOLAX/MIRALAX) powder Take 17 g by mouth daily.    [name] Kitchen triamcinolone cream (KENALOG) 0.1 % Apply 1 application topically 2 (two) times daily. 80 g 0  . nitroGLYCERIN (NITROSTAT) 0.4 MG SL tablet Place 1 tablet (0.4 mg total) under the tongue every 5 (five) minutes x 3 doses as needed for chest pain. (Patient not taking: Reported on 03/19/2015) 25 tablet 4   No current facility-administered medications for this visit.     Past  Medical History:  Diagnosis Date  . Arthritis   . CAD (coronary artery disease)    a. NSTEMI in 2011 s/p PCI/DES to distal Cx, s/p PCI/DES to OM2 (EF 40%). b. NSTEMI 02/2012 treated conservatively.  . Carotid artery stenosis    Left carotid endarterectomy 02/2001  . CHF (congestive heart failure) ([place])    a. EF 40% in 2011. b. EF 35-40% by echo 03/12/12.  [name] Kitchen Hyperlipidemia   . Hypertension   . Hyponatremia    Noted on 02/2012 admission  . Pneumonia    02/2012  . Renal insufficiency    Noted on 02/2012 admission  . Respiratory failure ([place])    02/2012 - requiring bipap - multifactorial in setting of PNA/CHF/NSTEMI  . Thyroid disease    Euthyroid 02/2012    Past Surgical History:  Procedure Laterality Date  . CARDIAC CATHETERIZATION    . CAROTID ENDARTERECTOMY     left 02/2001    ROS   Some left lower abdominal pains.  Otherwise as stated in the HPI and negative for all other systems except  PHYSICAL EXAM BP 130/64   Pulse 78   Ht 5\' 2"  (1.575 m)   Wt 130 lb (59 kg)   BMI 23.78 kg/m  NECK:  No jugular venous distention, waveform within normal limits, carotid upstroke brisk and symmetric, left bruit and CEA scar, no thyromegaly LUNGS:  Clear to auscultation bilaterally BACK:  No CVA tenderness CHEST:  Unremarkable HEART:  PMI not displaced or sustained,,S1 and S2 within normal limits, no S3, no S4, no clicks, no rubs, no murmurs ABD:  Flat, positive bowel sounds normal in frequency in pitch, no bruits, no rebound, no guarding, no midline pulsatile mass, no hepatomegaly, no splenomegaly EXT:  2 plus pulses upper, diminished bilateral DP/PT, mild right leg edema with some venous stasis changes, no cyanosis no clubbing SKIN:  Mild diffuse rash on her arms and upper chest  EKG: Sinus rhythm, rate 73, axis within normal limits, intervals within normal limits, premature ectopic complexes. 07/08/16   ASSESSMENT AND PLAN  Coronary artery disease  She is having no chest pain.   She  and I previously had the discussion and I would like for her to remain on her Plavix and ASA.  I think we should continue dual agents because she has had NSTEMI that we are managing medically. We discussed the risks benefits of stopping this medication.  Ischemic cardiomyopathy  She is euvolemic. We will continue with the meds as listed.   Chronic kidney disease  This is followed by Kenn File, MD  Hypertension  The blood pressure is at target. No change in medications is indicated. We will continue with therapeutic lifestyle changes (TLC).  Syncope The etiology of this was not clear though possibly vagal. No further workup or change in therapy is indicated.

## 2016-07-08 ENCOUNTER — Ambulatory Visit (INDEPENDENT_AMBULATORY_CARE_PROVIDER_SITE_OTHER): Payer: Medicare Other | Admitting: Cardiology

## 2016-07-08 ENCOUNTER — Encounter: Payer: Self-pay | Admitting: Cardiology

## 2016-07-08 VITALS — BP 130/64 | HR 78 | Ht 62.0 in | Wt 130.0 lb

## 2016-07-08 DIAGNOSIS — R55 Syncope and collapse: Secondary | ICD-10-CM

## 2016-07-08 DIAGNOSIS — I10 Essential (primary) hypertension: Secondary | ICD-10-CM

## 2016-07-08 DIAGNOSIS — I2510 Atherosclerotic heart disease of native coronary artery without angina pectoris: Secondary | ICD-10-CM

## 2016-07-08 MED ORDER — CLOPIDOGREL BISULFATE 75 MG PO TABS: 75.0000 mg | ORAL_TABLET | Freq: Every day | ORAL | 3 refills | Status: DC

## 2016-07-08 MED ORDER — ISOSORBIDE MONONITRATE ER 30 MG PO TB24: 15.0000 mg | ORAL_TABLET | Freq: Every day | ORAL | 3 refills | Status: DC

## 2016-07-08 MED ORDER — FUROSEMIDE 40 MG PO TABS: 40.0000 mg | ORAL_TABLET | Freq: Every day | ORAL | 3 refills | Status: DC

## 2016-07-08 NOTE — Patient Instructions (Signed)

## 2016-07-09 ENCOUNTER — Other Ambulatory Visit: Payer: Self-pay | Admitting: Family Medicine

## 2016-07-10 ENCOUNTER — Telehealth: Payer: Self-pay | Admitting: *Deleted

## 2016-07-10 NOTE — Telephone Encounter (Signed)
Please let pharmacy and pt know she should stop the metoprolol, continue the carvedilol. Needs to have follow up appt here to go over meds.

## 2016-07-10 NOTE — Telephone Encounter (Signed)
TC to pharmacy about medications, then TC to pt, appt made for Mon 11/13

## 2016-07-13 DIAGNOSIS — L309 Dermatitis, unspecified: Secondary | ICD-10-CM | POA: Diagnosis not present

## 2016-07-13 DIAGNOSIS — L03119 Cellulitis of unspecified part of limb: Secondary | ICD-10-CM | POA: Diagnosis not present

## 2016-08-03 ENCOUNTER — Ambulatory Visit (INDEPENDENT_AMBULATORY_CARE_PROVIDER_SITE_OTHER): Payer: Medicare Other | Admitting: Pediatrics

## 2016-08-03 ENCOUNTER — Encounter: Payer: Self-pay | Admitting: Pediatrics

## 2016-08-03 VITALS — BP 140/64 | HR 79 | Temp 97.3°F | Ht 62.0 in | Wt 132.0 lb

## 2016-08-03 DIAGNOSIS — H9190 Unspecified hearing loss, unspecified ear: Secondary | ICD-10-CM | POA: Diagnosis not present

## 2016-08-03 DIAGNOSIS — I10 Essential (primary) hypertension: Secondary | ICD-10-CM | POA: Diagnosis not present

## 2016-08-03 DIAGNOSIS — E039 Hypothyroidism, unspecified: Secondary | ICD-10-CM | POA: Diagnosis not present

## 2016-08-03 DIAGNOSIS — I2510 Atherosclerotic heart disease of native coronary artery without angina pectoris: Secondary | ICD-10-CM | POA: Diagnosis not present

## 2016-08-03 MED ORDER — CLOPIDOGREL BISULFATE 75 MG PO TABS: 75.0000 mg | ORAL_TABLET | Freq: Every day | ORAL | 3 refills | Status: DC

## 2016-08-03 NOTE — Progress Notes (Signed)
  Subjective:   Patient ID: Lori [name], female    DOB: [birth year], 82 y.o.   MRN: [id number] CC: Med check  HPI: Lori [name] is a 82 y.o. female presenting for Med check  Generally doing well Mostly good days now Bad days has some abd pain If she eats anything with seeds can start to get abd pain Goes to bed then gets better No recent abdominal pain Normal stooling, no constipation  HTN: at home 120s/50s  Rarely feels pressure in her chest with activity, she stops and rests and pressure goes away and she is able to continue her activities Not needing NTG Taking imdur daily  No swelling in LE No SOB Able to do normal activities that she wants to  Relevant past medical, surgical, family and social history reviewed. Allergies and medications reviewed and updated. History  Smoking Status  . Never Smoker  Smokeless Tobacco  . Never Used   ROS: Per HPI   Objective:    BP 140/64   Pulse 79   Temp 97.3 F (36.3 C) (Oral)   Ht '5\' 2"'$  (1.575 m)   Wt 132 lb (59.9 kg)   BMI 24.14 kg/m   Wt Readings from Last 3 Encounters:  08/03/16 132 lb (59.9 kg)  07/08/16 130 lb (59 kg)  03/19/16 133 lb (60.3 kg)   Gen: NAD, alert, cooperative with exam, NCAT, does have some difficulty hearing EYES: EOMI, no conjunctival injection, or no icterus ENT:  TMs pearly gray b/l, OP without erythema LYMPH: no cervical LAD CV: NRRR, normal S1/S2 Resp: CTABL, no wheezes, normal WOB Abd: +BS, soft, NTND. no guarding or organomegaly Ext: No edema, warm Neuro: Alert and oriented, strength equal b/l UE and LE, coordination grossly normal MSK: normal muscle bulk  Assessment & Plan:  Laquisha was seen today for med check.  Diagnoses and all orders for this visit:  Atherosclerosis of native coronary artery of native heart without angina pectoris Symptoms adequately controlled Cont imdur, ASA, plavix per cardiology -     clopidogrel (PLAVIX) 75 MG tablet; Take 1 tablet (75 mg total) by  mouth daily.  Essential hypertension Slightly elevated today, cont to check at home Asymptomatic -     BMP8+EGFR  Hypothyroidism, unspecified type Cont levothyroxine -     TSH  Hearing difficulty, unspecified laterality Has had hearing aids in the past Has one that "half works" now Not interested in audiology referral for hearing aids now, will let us know if that changes  Follow up plan: Return in about 6 months (around 01/31/2017) for med follow up. Assunta Found, MD [place]

## 2016-08-04 LAB — BMP8+EGFR
BUN/Creatinine Ratio: 17 (ref 12–28)
BUN: 18 mg/dL (ref 10–36)
CALCIUM: 8.5 mg/dL — AB (ref 8.7–10.3)
CHLORIDE: 97 mmol/L (ref 96–106)
CO2: 24 mmol/L (ref 18–29)
Creatinine, Ser: 1.04 mg/dL — ABNORMAL HIGH (ref 0.57–1.00)
GFR calc non Af Amer: 46 mL/min/{1.73_m2} — ABNORMAL LOW (ref >59)
GFR, EST AFRICAN AMERICAN: 53 mL/min/{1.73_m2} — AB (ref >59)
Glucose: 93 mg/dL (ref 65–99)
POTASSIUM: 4.8 mmol/L (ref 3.5–5.2)
SODIUM: 136 mmol/L (ref 134–144)

## 2016-08-04 LAB — TSH: TSH: 2.55 u[IU]/mL (ref 0.450–4.500)

## 2016-09-08 ENCOUNTER — Other Ambulatory Visit: Payer: Self-pay | Admitting: Family Medicine

## 2016-10-06 ENCOUNTER — Other Ambulatory Visit: Payer: Self-pay | Admitting: Pediatrics

## 2016-10-20 ENCOUNTER — Encounter: Payer: Self-pay | Admitting: Family

## 2016-10-20 ENCOUNTER — Ambulatory Visit (INDEPENDENT_AMBULATORY_CARE_PROVIDER_SITE_OTHER): Payer: Medicare Other | Admitting: Family

## 2016-10-20 VITALS — BP 164/67 | HR 88 | Temp 97.0°F | Ht 62.0 in | Wt 126.0 lb

## 2016-10-20 DIAGNOSIS — J020 Streptococcal pharyngitis: Secondary | ICD-10-CM

## 2016-10-20 DIAGNOSIS — J029 Acute pharyngitis, unspecified: Secondary | ICD-10-CM | POA: Diagnosis not present

## 2016-10-20 LAB — RAPID STREP SCREEN (MED CTR MEBANE ONLY): STREP GP A AG, IA W/REFLEX: POSITIVE — AB

## 2016-10-20 MED ORDER — AMOXICILLIN 875 MG PO TABS: 875.0000 mg | ORAL_TABLET | Freq: Two times a day (BID) | ORAL | 0 refills | Status: DC

## 2016-10-20 NOTE — Progress Notes (Signed)
   Subjective:    Patient ID: Lori [name], female    DOB: 09/10/1919, 83 y.o.   MRN: [id number]  Sore Throat   Associated symptoms include coughing, headaches and shortness of breath. Pertinent negatives include no ear pain.  Cough  This is a new problem. The current episode started yesterday. The problem has been gradually worsening. The problem occurs every few minutes. The cough is non-productive. Associated symptoms include chills, headaches, nasal congestion, postnasal drip, rhinorrhea, a sore throat and shortness of breath. Pertinent negatives include no ear congestion, ear pain, fever or myalgias. The symptoms are aggravated by lying down. She has tried rest for the symptoms. The treatment provided no relief.      Review of Systems  Constitutional: Positive for chills. Negative for fever.  HENT: Positive for postnasal drip, rhinorrhea and sore throat. Negative for ear pain.   Respiratory: Positive for cough and shortness of breath.   Musculoskeletal: Negative for myalgias.  Neurological: Positive for headaches.  All other systems reviewed and are negative.      Objective:   Physical Exam  Constitutional: She is oriented to person, place, and time. She appears well-developed and well-nourished. No distress.  HENT:  Head: Normocephalic and atraumatic.  Right Ear: External ear normal.  Left Ear: External ear normal.  Nose: Mucosal edema and rhinorrhea present.  Mouth/Throat: Posterior oropharyngeal erythema present.  Eyes: Pupils are equal, round, and reactive to light.  Neck: Normal range of motion. Neck supple. No thyromegaly present.  Cardiovascular: Normal rate, regular rhythm, normal heart sounds and intact distal pulses.   No murmur heard. Pulmonary/Chest: Effort normal. No respiratory distress. She has decreased breath sounds. She has no wheezes.  Dry non productive cough   Abdominal: Soft. Bowel sounds are normal. She exhibits no distension. There is no  tenderness.  Musculoskeletal: Normal range of motion. She exhibits no edema or tenderness.  Neurological: She is alert and oriented to person, place, and time. She has normal reflexes. No cranial nerve deficit.  Skin: Skin is warm and dry.  Psychiatric: She has a normal mood and affect. Her behavior is normal. Judgment and thought content normal.  Vitals reviewed.    BP (!) 164/67   Pulse 88   Temp 97 F (36.1 C) (Oral)   Ht 5\' 2"  (1.575 m)   Wt 126 lb (57.2 kg)   BMI 23.05 kg/m      Assessment & Plan:  1. Sore throat - Rapid strep screen (not at [facility])  2. Strep throat - Take meds as prescribed - Use a cool mist humidifier  -Use saline nose sprays frequently -Saline irrigations of the nose can be very helpful if done frequently.  * 4X daily for 1 week*  * Use of a nettie pot can be helpful with this. Follow directions with this* -Force fluids -For any cough or congestion  Use plain Mucinex- regular strength or max strength is fine   * Children- consult with Pharmacist for dosing -For fever or aces or pains- take tylenol or ibuprofen appropriate for age and weight.  * for fevers greater than 101 orally you may alternate ibuprofen and tylenol every  3 hours. -Throat lozenges if help -New toothbrush in 3 days - amoxicillin (AMOXIL) 875 MG tablet; Take 1 tablet (875 mg total) by mouth 2 (two) times daily.  Dispense: 20 tablet; Refill: 0   Evelina Dun, FNP

## 2016-10-20 NOTE — Patient Instructions (Signed)
Strep Throat Strep throat is a bacterial infection of the throat. Your health care provider may call the infection tonsillitis or pharyngitis, depending on whether there is swelling in the tonsils or at the back of the throat. Strep throat is most common during the cold months of the year in children who are 5-81 years of age, but it can happen during any season in people of any age. This infection is spread from person to person (contagious) through coughing, sneezing, or close contact. What are the causes? Strep throat is caused by the bacteria called Streptococcus pyogenes. What increases the risk? This condition is more likely to develop in:  People who spend time in crowded places where the infection can spread easily.  People who have close contact with someone who has strep throat.  What are the signs or symptoms? Symptoms of this condition include:  Fever or chills.  Redness, swelling, or pain in the tonsils or throat.  Pain or difficulty when swallowing.  White or yellow spots on the tonsils or throat.  Swollen, tender glands in the neck or under the jaw.  Red rash all over the body (rare).  How is this diagnosed? This condition is diagnosed by performing a rapid strep test or by taking a swab of your throat (throat culture test). Results from a rapid strep test are usually ready in a few minutes, but throat culture test results are available after one or two days. How is this treated? This condition is treated with antibiotic medicine. Follow these instructions at home: Medicines  Take over-the-counter and prescription medicines only as told by your health care provider.  Take your antibiotic as told by your health care provider. Do not stop taking the antibiotic even if you start to feel better.  Have family members who also have a sore throat or fever tested for strep throat. They may need antibiotics if they have the strep infection. Eating and drinking  Do not  share food, drinking cups, or personal items that could cause the infection to spread to other people.  If swallowing is difficult, try eating soft foods until your sore throat feels better.  Drink enough fluid to keep your urine clear or pale yellow. General instructions  Gargle with a salt-water mixture 3-4 times per day or as needed. To make a salt-water mixture, completely dissolve -1 tsp of salt in 1 cup of warm water.  Make sure that all household members wash their hands well.  Get plenty of rest.  Stay home from school or work until you have been taking antibiotics for 24 hours.  Keep all follow-up visits as told by your health care provider. This is important. Contact a health care provider if:  The glands in your neck continue to get bigger.  You develop a rash, cough, or earache.  You cough up a thick liquid that is green, yellow-brown, or bloody.  You have pain or discomfort that does not get better with medicine.  Your problems seem to be getting worse rather than better.  You have a fever. Get help right away if:  You have new symptoms, such as vomiting, severe headache, stiff or painful neck, chest pain, or shortness of breath.  You have severe throat pain, drooling, or changes in your voice.  You have swelling of the neck, or the skin on the neck becomes red and tender.  You have signs of dehydration, such as fatigue, dry mouth, and decreased urination.  You become increasingly sleepy, or   you cannot wake up completely.  Your joints become red or painful. This information is not intended to replace advice given to you by your health care provider. Make sure you discuss any questions you have with your health care provider. Document Released: 09/04/2000 Document Revised: 05/06/2016 Document Reviewed: 12/31/2014 Elsevier Interactive Patient Education  2017 [employer] Inc.  

## 2016-10-21 ENCOUNTER — Telehealth: Payer: Self-pay | Admitting: Family

## 2016-10-21 NOTE — Telephone Encounter (Signed)
Pt's daughter calling to confirm pt dxed with Strep

## 2016-10-25 ENCOUNTER — Encounter (HOSPITAL_COMMUNITY): Payer: Self-pay | Admitting: Emergency Medicine

## 2016-10-25 ENCOUNTER — Emergency Department (HOSPITAL_COMMUNITY)
Admission: EM | Admit: 2016-10-25 | Discharge: 2016-10-25 | Disposition: A | Discharge status: Home / Self Care | Payer: Medicare Other | Attending: Emergency Medicine | Admitting: Emergency Medicine

## 2016-10-25 ENCOUNTER — Emergency Department (HOSPITAL_COMMUNITY): Payer: Medicare Other

## 2016-10-25 DIAGNOSIS — I130 Hypertensive heart and chronic kidney disease with heart failure and stage 1 through stage 4 chronic kidney disease, or unspecified chronic kidney disease: Secondary | ICD-10-CM | POA: Insufficient documentation

## 2016-10-25 DIAGNOSIS — I509 Heart failure, unspecified: Secondary | ICD-10-CM | POA: Insufficient documentation

## 2016-10-25 DIAGNOSIS — R05 Cough: Secondary | ICD-10-CM | POA: Diagnosis not present

## 2016-10-25 DIAGNOSIS — E039 Hypothyroidism, unspecified: Secondary | ICD-10-CM | POA: Diagnosis not present

## 2016-10-25 DIAGNOSIS — Z79899 Other long term (current) drug therapy: Secondary | ICD-10-CM | POA: Diagnosis not present

## 2016-10-25 DIAGNOSIS — I2510 Atherosclerotic heart disease of native coronary artery without angina pectoris: Secondary | ICD-10-CM | POA: Insufficient documentation

## 2016-10-25 DIAGNOSIS — J101 Influenza due to other identified influenza virus with other respiratory manifestations: Secondary | ICD-10-CM | POA: Diagnosis not present

## 2016-10-25 DIAGNOSIS — Z7982 Long term (current) use of aspirin: Secondary | ICD-10-CM | POA: Diagnosis not present

## 2016-10-25 DIAGNOSIS — N189 Chronic kidney disease, unspecified: Secondary | ICD-10-CM | POA: Insufficient documentation

## 2016-10-25 DIAGNOSIS — R059 Cough, unspecified: Secondary | ICD-10-CM

## 2016-10-25 DIAGNOSIS — J1001 Influenza due to other identified influenza virus with the same other identified influenza virus pneumonia: Secondary | ICD-10-CM | POA: Diagnosis not present

## 2016-10-25 LAB — CBC WITH DIFFERENTIAL/PLATELET
BASOS PCT: 0 %
Basophils Absolute: 0 10*3/uL (ref 0.0–0.1)
EOS ABS: 0.3 10*3/uL (ref 0.0–0.7)
EOS PCT: 4 %
HCT: 32.2 % — ABNORMAL LOW (ref 36.0–46.0)
Hemoglobin: 10.7 g/dL — ABNORMAL LOW (ref 12.0–15.0)
Lymphocytes Relative: 39 %
Lymphs Abs: 2.4 10*3/uL (ref 0.7–4.0)
MCH: 27.2 pg (ref 26.0–34.0)
MCHC: 33.2 g/dL (ref 30.0–36.0)
MCV: 81.7 fL (ref 78.0–100.0)
MONOS PCT: 11 %
Monocytes Absolute: 0.7 10*3/uL (ref 0.1–1.0)
NEUTROS PCT: 46 %
Neutro Abs: 2.9 10*3/uL (ref 1.7–7.7)
PLATELETS: 158 10*3/uL (ref 150–400)
RBC: 3.94 MIL/uL (ref 3.87–5.11)
RDW: 14.6 % (ref 11.5–15.5)
WBC: 6.3 10*3/uL (ref 4.0–10.5)

## 2016-10-25 LAB — COMPREHENSIVE METABOLIC PANEL
ALBUMIN: 2.9 g/dL — AB (ref 3.5–5.0)
ALK PHOS: 54 U/L (ref 38–126)
ALT: 10 U/L — ABNORMAL LOW (ref 14–54)
ANION GAP: 9 (ref 5–15)
AST: 18 U/L (ref 15–41)
BUN: 15 mg/dL (ref 6–20)
CALCIUM: 7.7 mg/dL — AB (ref 8.9–10.3)
CHLORIDE: 97 mmol/L — AB (ref 101–111)
CO2: 23 mmol/L (ref 22–32)
Creatinine, Ser: 1.04 mg/dL — ABNORMAL HIGH (ref 0.44–1.00)
GFR calc Af Amer: 51 mL/min — ABNORMAL LOW (ref >60)
GFR calc non Af Amer: 44 mL/min — ABNORMAL LOW (ref >60)
GLUCOSE: 98 mg/dL (ref 65–99)
POTASSIUM: 4.4 mmol/L (ref 3.5–5.1)
SODIUM: 129 mmol/L — AB (ref 135–145)
Total Bilirubin: 0.3 mg/dL (ref 0.3–1.2)
Total Protein: 5.4 g/dL — ABNORMAL LOW (ref 6.5–8.1)

## 2016-10-25 LAB — URINALYSIS, ROUTINE W REFLEX MICROSCOPIC
Bilirubin Urine: NEGATIVE
GLUCOSE, UA: NEGATIVE mg/dL
Ketones, ur: NEGATIVE mg/dL
NITRITE: NEGATIVE
PH: 6 (ref 5.0–8.0)
Protein, ur: NEGATIVE mg/dL
SPECIFIC GRAVITY, URINE: 1.009 (ref 1.005–1.030)

## 2016-10-25 LAB — I-STAT CHEM 8, ED
BUN: 14 mg/dL (ref 6–20)
CHLORIDE: 98 mmol/L — AB (ref 101–111)
CREATININE: 1 mg/dL (ref 0.44–1.00)
Calcium, Ion: 1.04 mmol/L — ABNORMAL LOW (ref 1.15–1.40)
Glucose, Bld: 97 mg/dL (ref 65–99)
HEMATOCRIT: 29 % — AB (ref 36.0–46.0)
HEMOGLOBIN: 9.9 g/dL — AB (ref 12.0–15.0)
Potassium: 4.5 mmol/L (ref 3.5–5.1)
Sodium: 130 mmol/L — ABNORMAL LOW (ref 135–145)
TCO2: 23 mmol/L (ref 0–100)

## 2016-10-25 LAB — INFLUENZA PANEL BY PCR (TYPE A & B)
INFLAPCR: NEGATIVE
Influenza B By PCR: POSITIVE — AB

## 2016-10-25 MED ORDER — OSELTAMIVIR PHOSPHATE 75 MG PO CAPS
75.0000 mg | ORAL_CAPSULE | Freq: Once | ORAL | Status: AC
  Administered 2016-10-25: 75 mg via ORAL
  Filled 2016-10-25: qty 1

## 2016-10-25 MED ORDER — SODIUM CHLORIDE 0.9 % IV SOLN: Freq: Once | INTRAVENOUS | Status: DC

## 2016-10-25 MED ORDER — SODIUM CHLORIDE 0.9 % IV SOLN
Freq: Once | INTRAVENOUS | Status: AC
  Administered 2016-10-25: 17:00:00 via INTRAVENOUS

## 2016-10-25 MED ORDER — OSELTAMIVIR PHOSPHATE 75 MG PO CAPS: 75.0000 mg | ORAL_CAPSULE | Freq: Two times a day (BID) | ORAL | 0 refills | Status: DC

## 2016-10-25 MED ORDER — SODIUM CHLORIDE 0.9 % IV BOLUS (SEPSIS)
500.0000 mL | Freq: Once | INTRAVENOUS | Status: AC
  Administered 2016-10-25: 500 mL via INTRAVENOUS

## 2016-10-25 NOTE — ED Provider Notes (Signed)
[facility] DEPT Provider Note   CSN: [id number] Arrival date & time: 10/25/16  1507     History   Chief Complaint Chief Complaint  Patient presents with  . Cough    HPI Lori [name] is a 83 y.o. female.  HPI 83 year old female who presents with cough. She has a history of coronary artery disease, chronic systolic and diastolic heart failure with EF of 35-40% in 2013, hypertension, hyperlipidemia, and renal insufficiency. Patient lives at home with husband. States that 1 week ago she developed sore throat, chest congestion, cough productive of clear sputum. Was seen by patient's primary care doctor on Wednesday and diagnosed with strep pharyngitis. Was started on amoxicillin which she started taking. She still has about 4-5 days left. Family concerned as she has had decreased appetite and not eating or drinking well. Patient has felt very tired and fatigued. Coughing and sore throat has improved. She has not had fever, chest pain, difficulty breathing, lower extremity edema, orthopnea, or PND. She has not had diarrhea, dysuria, urinary frequency, nausea or vomiting.  Past Medical History:  Diagnosis Date  . Arthritis   . CAD (coronary artery disease)    a. NSTEMI in 2011 s/p PCI/DES to distal Cx, s/p PCI/DES to OM2 (EF 40%). b. NSTEMI 02/2012 treated conservatively.  . Carotid artery stenosis    Left carotid endarterectomy 02/2001  . CHF (congestive heart failure) ([place])    a. EF 40% in 2011. b. EF 35-40% by echo 03/12/12.  [name] Kitchen Hyperlipidemia   . Hypertension   . Hyponatremia    Noted on 02/2012 admission  . Pneumonia    02/2012  . Renal insufficiency    Noted on 02/2012 admission  . Respiratory failure ([place])    02/2012 - requiring bipap - multifactorial in setting of PNA/CHF/NSTEMI  . Thyroid disease    Euthyroid 02/2012    Patient Active Problem List   Diagnosis Date Noted  . Hypothyroidism 08/03/2016  . Venous (peripheral) insufficiency 03/19/2016  . Vertebrobasilar  insufficiency 10/08/2015  . Cervical spondylosis without myelopathy 10/08/2015  . Carotid sinus syncope 10/08/2015  . History of Clostridium difficile 10/01/2014  . Hemorrhoids 06/11/2014  . Ileus ([place]) 08/08/2013  . Spinal stenosis of lumbar region 05/26/2013  . Chronic systolic heart failure ([place]) 03/21/2012  . Ischemic cardiomyopathy 03/21/2012  . CKD (chronic kidney disease) 03/21/2012  . NSTEMI (non-ST elevated myocardial infarction) ([place]) 03/12/2012  . Dyslipidemia 12/10/2010  . CORONARY ATHEROSCLEROSIS NATIVE CORONARY ARTERY 08/27/2010  . Accelerated hypertension 08/26/2010  . CAROTID STENOSIS 08/26/2010    Past Surgical History:  Procedure Laterality Date  . CARDIAC CATHETERIZATION    . CAROTID ENDARTERECTOMY     left 02/2001    OB History    Gravida Para Term Preterm AB Living   2 2 2     2    SAB TAB Ectopic Multiple Live Births                   Home Medications    Prior to Admission medications   Medication Sig Start Date End Date Taking? Authorizing Provider  amoxicillin (AMOXIL) 875 MG tablet Take 1 tablet (875 mg total) by mouth 2 (two) times daily. 10/20/16  Yes Sharion Balloon, FNP  aspirin 81 MG tablet Take 81 mg by mouth daily.     Yes Historical Provider, MD  carvedilol (COREG) 3.125 MG tablet Take 1 tablet by mouth 2 times a day with meals - Stop taking metoprolol Patient taking differently: Take 1  tablet by mouth 2 times a day with meals - 10/06/16  Yes Sharion Balloon, FNP  clopidogrel (PLAVIX) 75 MG tablet Take 1 tablet (75 mg total) by mouth daily. 08/03/16  Yes Eustaquio Maize, MD  furosemide (LASIX) 40 MG tablet Take 1 tablet (40 mg total) by mouth daily. Patient taking differently: Take 40 mg by mouth daily. Patient takes as needed. 07/08/16  Yes Minus Breeding, MD  isosorbide mononitrate (IMDUR) 30 MG 24 hr tablet Take 0.5 tablets (15 mg total) by mouth daily. 07/08/16  Yes Minus Breeding, MD  levothyroxine (SYNTHROID, LEVOTHROID) 50 MCG tablet  Take 1 Tablet by mouth once daily before breakfast 09/08/16  Yes Sharion Balloon, FNP  nitroGLYCERIN (NITROSTAT) 0.4 MG SL tablet Place 1 tablet (0.4 mg total) under the tongue every 5 (five) minutes x 3 doses as needed for chest pain. 03/15/12 10/25/16 Yes Dayna N Dunn, PA-C  oseltamivir (TAMIFLU) 75 MG capsule Take 1 capsule (75 mg total) by mouth every 12 (twelve) hours. 10/25/16   Forde Dandy, MD    Family History Family History  Problem Relation Age of Onset  . Heart failure Mother 19  . Rheum arthritis Father     prostate  . Prostate cancer    . Coronary artery disease Brother 76    CABG, alive at 10  . Coronary artery disease Brother 97    Died  . Heart failure Sister   . Heart failure Sister     Social History Social History  Substance Use Topics  . Smoking status: Never Smoker  . Smokeless tobacco: Never Used  . Alcohol use No     Allergies   Patient has no known allergies.   Review of Systems Review of Systems 10/14 systems reviewed and are negative other than those stated in the HPI   Physical Exam Updated Vital Signs BP 152/55   Pulse 71   Temp 97.7 F (36.5 C) (Oral)   Resp 22   Ht 5\' 2"  (1.575 m)   Wt 125 lb (56.7 kg)   SpO2 98%   BMI 22.86 kg/m   Physical Exam Physical Exam  Nursing note and vitals reviewed. Constitutional: elderly woman, non-toxic, and in no acute distress Head: Normocephalic and atraumatic.  Mouth/Throat: Oropharynx is clear and dry mucous membranes.  Neck: Normal range of motion. Neck supple.  Cardiovascular: Normal rate and regular rhythm.  No edema Pulmonary/Chest: Effort normal and breath sounds normal.  Abdominal: Soft. There is no tenderness. There is no rebound and no guarding.  Musculoskeletal: No deformities Neurological: Alert, no facial droop, fluent speech, moves all extremities symmetrically Skin: Skin is warm and dry.  Psychiatric: Cooperative   ED Treatments / Results  Labs (all labs ordered are listed,  but only abnormal results are displayed) Labs Reviewed  CBC WITH DIFFERENTIAL/PLATELET - Abnormal; Notable for the following:       Result Value   Hemoglobin 10.7 (*)    HCT 32.2 (*)    All other components within normal limits  COMPREHENSIVE METABOLIC PANEL - Abnormal; Notable for the following:    Sodium 129 (*)    Chloride 97 (*)    Creatinine, Ser 1.04 (*)    Calcium 7.7 (*)    Total Protein 5.4 (*)    Albumin 2.9 (*)    ALT 10 (*)    GFR calc non Af Amer 44 (*)    GFR calc Af Amer 51 (*)    All other components within normal  limits  URINALYSIS, ROUTINE W REFLEX MICROSCOPIC - Abnormal; Notable for the following:    APPearance HAZY (*)    Hgb urine dipstick SMALL (*)    Leukocytes, UA SMALL (*)    Bacteria, UA MANY (*)    All other components within normal limits  INFLUENZA PANEL BY PCR (TYPE A & B) - Abnormal; Notable for the following:    Influenza B By PCR POSITIVE (*)    All other components within normal limits  I-STAT CHEM 8, ED - Abnormal; Notable for the following:    Sodium 130 (*)    Chloride 98 (*)    Calcium, Ion 1.04 (*)    Hemoglobin 9.9 (*)    HCT 29.0 (*)    All other components within normal limits  URINE CULTURE    EKG  EKG Interpretation None       Radiology Dg Chest 2 View  Result Date: 10/25/2016 CLINICAL DATA:  Cough. EXAM: CHEST  2 VIEW COMPARISON:  April 27, 2016 FINDINGS: Cardiomegaly. The hila and mediastinum are normal. No pulmonary nodules, masses, or focal infiltrates. IMPRESSION: No active cardiopulmonary disease. Electronically Signed   By: Dorise Bullion III M.D   On: 10/25/2016 15:55    Procedures Procedures (including critical care time)  Medications Ordered in ED Medications  sodium chloride 0.9 % bolus 500 mL (0 mLs Intravenous Stopped 10/25/16 1723)  0.9 %  sodium chloride infusion ( Intravenous Stopped 10/25/16 1815)  oseltamivir (TAMIFLU) capsule 75 mg (75 mg Oral Given 10/25/16 1722)     Initial Impression / Assessment  and Plan / ED Course  I have reviewed the triage vital signs and the nursing notes.  Pertinent labs & imaging results that were available during my care of the patient were reviewed by me and considered in my medical decision making (see chart for details).     83 year old female who presents with with persistent cough and decreased appetite with treatment of antibiotics for strep throat. She is afebrile and with Non-concerning vital signs. Distal mildly dry on exam, but is nontoxic and in no acute distress. Chest x-ray visualized and she does not have pneumonia or other acute cardiopulmonary processes. She is positive for influenza B, this is likely cause of her persistent symptoms.  She has some hyponatremia 129 with symptoms. Given IVF. Repeat sodium 130. Mentating well. Tolerating Po. Ambulating steadily. I do not think she requires hospitalization at this time for flu. Discussed supportive care. Will give tamiflu. Will increased fluids. Will see PCP in 2-3 days for recheck on sodium and status.   Discussed strict return instructions w/ patient and daughter. They expressed understanding of all discharge instructions and felt comfortable with the plan of care.   Final Clinical Impressions(s) / ED Diagnoses   Final diagnoses:  Cough  Influenza B    New Prescriptions New Prescriptions   OSELTAMIVIR (TAMIFLU) 75 MG CAPSULE    Take 1 capsule (75 mg total) by mouth every 12 (twelve) hours.     Forde Dandy, MD 10/25/16 [phone]

## 2016-10-25 NOTE — Discharge Instructions (Signed)
You tested positive for the flu. Please take tamiflu as prescribed. Please follow-up with your primary care doctor in 1-3 days for recheck and please have them check a repeat sodium on you.  Drink plenty of fluids, and try to keep well nourished.  Return without fail for worsening symptoms, including fever, confusion, vomiting, difficulty breathing, or any other symptoms concerning to you.

## 2016-10-25 NOTE — ED Triage Notes (Signed)
Patient c/o productive cough, chest congestion, and possible dehydration. Per family diagnosed with Strep on Wednesday by PCP and given amoxicillin. Per family cough has improved but patient not drinking or eating well. Patient denies any pain in throat now.

## 2016-10-27 LAB — URINE CULTURE: Culture: NO GROWTH

## 2016-10-30 ENCOUNTER — Encounter: Payer: Self-pay | Admitting: Pediatrics

## 2016-10-30 ENCOUNTER — Ambulatory Visit (INDEPENDENT_AMBULATORY_CARE_PROVIDER_SITE_OTHER): Payer: Medicare Other | Admitting: Pediatrics

## 2016-10-30 VITALS — BP 157/70 | HR 80 | Temp 97.4°F | Ht 62.0 in | Wt 124.0 lb

## 2016-10-30 DIAGNOSIS — E871 Hypo-osmolality and hyponatremia: Secondary | ICD-10-CM | POA: Diagnosis not present

## 2016-10-30 DIAGNOSIS — H6123 Impacted cerumen, bilateral: Secondary | ICD-10-CM | POA: Diagnosis not present

## 2016-10-30 DIAGNOSIS — J101 Influenza due to other identified influenza virus with other respiratory manifestations: Secondary | ICD-10-CM | POA: Diagnosis not present

## 2016-10-30 DIAGNOSIS — L219 Seborrheic dermatitis, unspecified: Secondary | ICD-10-CM

## 2016-10-30 DIAGNOSIS — I2510 Atherosclerotic heart disease of native coronary artery without angina pectoris: Secondary | ICD-10-CM

## 2016-10-30 DIAGNOSIS — I5022 Chronic systolic (congestive) heart failure: Secondary | ICD-10-CM

## 2016-10-30 MED ORDER — KETOCONAZOLE 2 % EX CREA: 1.0000 "application " | TOPICAL_CREAM | Freq: Every day | CUTANEOUS | 1 refills | Status: DC

## 2016-10-30 NOTE — Progress Notes (Signed)
  Subjective:   Patient ID: Lori [name], female    DOB: 06-16-1919, 83 y.o.   MRN: [id number] CC: Hospitalization Follow-up  HPI: Lori [name] is a 83 y.o. female presenting for Hospitalization Follow-up  Recently seen in ED, diagnosed with influenza B Feeling back to normal self Sleepy still during the day No fevers Minimal coughing Minimal sore throat now One pill left of the amoxicillin for strep throat Stopped the tamiflu because was feeling well Appetite almost back to normal, she thinks the antibiotic is throwing it off slightly Eating regularly throughout day Wants to get back to normal activities, Wed night prayer group  Ears get sore sometimes behind the ear, puts lotion on it and after a few days will get better Hearing has been decreasing  No swelling in LE, no CP, no palpitations Not feeling SOB Has not needed nitroglycerin in 2-3 years She says she threw away Rx because was out of date and doesn't want a new Rx for it  Relevant past medical, surgical, family and social history reviewed. Allergies and medications reviewed and updated. History  Smoking Status  . Never Smoker  Smokeless Tobacco  . Never Used   ROS: Per HPI   Objective:    BP (!) 157/70   Pulse 80   Temp 97.4 F (36.3 C) (Oral)   Ht '5\' 2"'$  (1.575 m)   Wt 124 lb (56.2 kg)   BMI 22.68 kg/m   Wt Readings from Last 3 Encounters:  10/30/16 124 lb (56.2 kg)  10/25/16 125 lb (56.7 kg)  10/20/16 126 lb (57.2 kg)    Gen: NAD, alert, cooperative with exam, NCAT EYES: EOMI, no conjunctival injection, or no icterus ENT:  TMs with cerumen impaction b/l, OP without erythema LYMPH: no cervical LAD CV: NRRR, normal S1/S2, no murmur, distal pulses 2+ b/l Resp: CTABL, no wheezes, normal WOB Abd: +BS, soft, NTND.  Ext: No edema, warm Neuro: Alert and oriented Skin: behind ears slight redness, yellow crust b/l  Assessment & Plan:  Antavia was seen today for hospitalization  follow-up.  Diagnoses and all orders for this visit:  Influenza B Dx with flu B 4 days ago Feeling much better now, mostly back to normal Cont symptom care as needed  Seborrheic dermatitis Trial below behind ears -     ketoconazole (NIZORAL) 2 % cream; Apply 1 application topically daily. Behind ears when sore  Hyponatremia Na 129, recheck 130 in ED earlier this week Usually normal Na levels Recheck today -     BMP8+EGFR  Atherosclerosis of native coronary artery of native heart without angina pectoris Symptoms stable, no chest pain Doesn't want new nitroglycerin Rx Knows she needs medical care with any chest pain  Chronic systolic heart failure ([place]) Euvolemic  Bilateral impacted cerumen Discussed options, hearing is affected, pt wants to try irrigation Has had before, helped  Impacted cerumen removal: After procedure described to patient and patient agreed with proceeding, cerumen impaction was removed from both ears using irrigation. TMs pearly gray following procedure. Pt tolerated procedure well.    Follow up plan: Return in about 3 months (around 01/27/2017). Assunta Found, MD [place]

## 2016-12-08 ENCOUNTER — Other Ambulatory Visit: Payer: Self-pay | Admitting: Family

## 2016-12-14 DIAGNOSIS — L570 Actinic keratosis: Secondary | ICD-10-CM | POA: Diagnosis not present

## 2016-12-14 DIAGNOSIS — B078 Other viral warts: Secondary | ICD-10-CM | POA: Diagnosis not present

## 2016-12-14 DIAGNOSIS — L820 Inflamed seborrheic keratosis: Secondary | ICD-10-CM | POA: Diagnosis not present

## 2016-12-14 DIAGNOSIS — X32XXXD Exposure to sunlight, subsequent encounter: Secondary | ICD-10-CM | POA: Diagnosis not present

## 2017-02-01 ENCOUNTER — Encounter: Payer: Self-pay | Admitting: Pediatrics

## 2017-02-01 ENCOUNTER — Ambulatory Visit (INDEPENDENT_AMBULATORY_CARE_PROVIDER_SITE_OTHER): Payer: Medicare Other | Admitting: Pediatrics

## 2017-02-01 VITALS — BP 139/68 | HR 90 | Temp 98.3°F | Ht 62.0 in | Wt 127.2 lb

## 2017-02-01 DIAGNOSIS — L309 Dermatitis, unspecified: Secondary | ICD-10-CM

## 2017-02-01 DIAGNOSIS — E871 Hypo-osmolality and hyponatremia: Secondary | ICD-10-CM

## 2017-02-01 DIAGNOSIS — H9193 Unspecified hearing loss, bilateral: Secondary | ICD-10-CM

## 2017-02-01 DIAGNOSIS — I2510 Atherosclerotic heart disease of native coronary artery without angina pectoris: Secondary | ICD-10-CM

## 2017-02-01 MED ORDER — TRIAMCINOLONE ACETONIDE 0.025 % EX OINT: 1.0000 "application " | TOPICAL_OINTMENT | Freq: Two times a day (BID) | CUTANEOUS | 0 refills | Status: DC

## 2017-02-01 NOTE — Addendum Note (Signed)
Addended by: Liliane Bade on: 02/01/2017 02:43 PM   Modules accepted: Orders

## 2017-02-01 NOTE — Progress Notes (Signed)
  Subjective:   Patient ID: Lori [name], female    DOB: [birth year], 83 y.o.   MRN: [id number] CC: Follow-up (6 month) multiple med problems HPI: Lori [name] is a 83 y.o. female presenting for Follow-up (49 month)  Feeling well Not able to hear well Lost her one hearing aid Interested in getting them replaced  Most mornings will feel tired after getting up and walking around, has to sit down and the feeling goes away H/o CAD, on plavix, ASA, BB  Checked BP at home last week and was 160s SBP No CP, symptoms with that Says once since last visit when she checked BP her HR read in the 40s Usually 60s-70s, rarely up to 80s-90s when she is upset  No dizziness, no falls  Continues to have itching R anterior leg  Appetite has been fine Still with some pain around abd where she had shingles several months ago  Relevant past medical, surgical, family and social history reviewed. Allergies and medications reviewed and updated. History  Smoking Status  . Never Smoker  Smokeless Tobacco  . Never Used   ROS: Per HPI   Objective:    BP 139/68   Pulse 90   Temp 98.3 F (36.8 C) (Oral)   Ht _0  (1.575 m)   Wt 127 lb 3.2 oz (57.7 kg)   BMI 23.27 kg/m   Wt Readings from Last 3 Encounters:  02/01/17 127 lb 3.2 oz (57.7 kg)  10/30/16 124 lb (56.2 kg)  10/25/16 125 lb (56.7 kg)    Gen: NAD, alert, cooperative with exam, NCAT EYES: EOMI, no conjunctival injection, or no icterus ENT:  TMs pearly gray b/l, OP without erythema LYMPH: no cervical LAD CV: NRRR, normal S1/S2, no murmur, distal pulses 2+ b/l Resp: CTABL, no wheezes, normal WOB Abd: +BS, soft, NTND. no guarding or organomegaly Ext: No edema, warm Neuro: Alert and oriented Skin: excoriations present R anterior shin, small healing papules over L side of abd in band  Assessment & Plan:  Lori [name] was seen today for follow-up multiple med problems  Diagnoses and all orders for this visit:  Eczema, unspecified  type Trial of triamcinolone, avoid intensive therapy lotions, can cause itching -     triamcinolone (KENALOG) 0.025 % ointment; Apply 1 application topically 2 (two) times daily.  HTN Cont to follow at home Adequate control today On carvedilol 3.179m BID  Hearing problem of both ears Wants referral for hearing aid eval -     Ambulatory referral to Audiology  Hyponatremia Repeat BMP -     BMP8+EGFR; Future  Atherosclerosis of native coronary artery of native heart without angina pectoris Stable Cont meds Has f/u upcoming with cardiology   Follow up plan: Return in about 3 months (around 05/04/2017). CAssunta Found MD [place]

## 2017-02-02 LAB — BMP8+EGFR
BUN / CREAT RATIO: 17 (ref 12–28)
BUN: 18 mg/dL (ref 10–36)
CO2: 24 mmol/L (ref 18–29)
CREATININE: 1.08 mg/dL — AB (ref 0.57–1.00)
Calcium: 8.7 mg/dL (ref 8.7–10.3)
Chloride: 99 mmol/L (ref 96–106)
GFR calc non Af Amer: 43 mL/min/{1.73_m2} — ABNORMAL LOW (ref >59)
GFR, EST AFRICAN AMERICAN: 50 mL/min/{1.73_m2} — AB (ref >59)
GLUCOSE: 93 mg/dL (ref 65–99)
Potassium: 5.3 mmol/L — ABNORMAL HIGH (ref 3.5–5.2)
SODIUM: 139 mmol/L (ref 134–144)

## 2017-04-07 DIAGNOSIS — H353131 Nonexudative age-related macular degeneration, bilateral, early dry stage: Secondary | ICD-10-CM | POA: Diagnosis not present

## 2017-04-07 DIAGNOSIS — H04123 Dry eye syndrome of bilateral lacrimal glands: Secondary | ICD-10-CM | POA: Diagnosis not present

## 2017-04-07 DIAGNOSIS — H43813 Vitreous degeneration, bilateral: Secondary | ICD-10-CM | POA: Diagnosis not present

## 2017-04-07 DIAGNOSIS — H43393 Other vitreous opacities, bilateral: Secondary | ICD-10-CM | POA: Diagnosis not present

## 2017-04-12 ENCOUNTER — Telehealth: Payer: Self-pay | Admitting: Audiology

## 2017-04-12 ENCOUNTER — Other Ambulatory Visit: Payer: Self-pay | Admitting: *Deleted

## 2017-04-12 DIAGNOSIS — H9193 Unspecified hearing loss, bilateral: Secondary | ICD-10-CM

## 2017-04-21 ENCOUNTER — Ambulatory Visit (INDEPENDENT_AMBULATORY_CARE_PROVIDER_SITE_OTHER): Payer: Medicare Other | Admitting: Pediatrics

## 2017-04-21 ENCOUNTER — Other Ambulatory Visit: Payer: Self-pay | Admitting: Pediatrics

## 2017-04-21 ENCOUNTER — Encounter: Payer: Self-pay | Admitting: Pediatrics

## 2017-04-21 VITALS — BP 161/69 | HR 76 | Temp 98.0°F | Ht 62.0 in | Wt 126.2 lb

## 2017-04-21 DIAGNOSIS — K219 Gastro-esophageal reflux disease without esophagitis: Secondary | ICD-10-CM | POA: Diagnosis not present

## 2017-04-21 DIAGNOSIS — E039 Hypothyroidism, unspecified: Secondary | ICD-10-CM | POA: Diagnosis not present

## 2017-04-21 DIAGNOSIS — L219 Seborrheic dermatitis, unspecified: Secondary | ICD-10-CM

## 2017-04-21 DIAGNOSIS — N189 Chronic kidney disease, unspecified: Secondary | ICD-10-CM

## 2017-04-21 DIAGNOSIS — I10 Essential (primary) hypertension: Secondary | ICD-10-CM

## 2017-04-21 DIAGNOSIS — I2510 Atherosclerotic heart disease of native coronary artery without angina pectoris: Secondary | ICD-10-CM | POA: Diagnosis not present

## 2017-04-21 DIAGNOSIS — D649 Anemia, unspecified: Secondary | ICD-10-CM | POA: Diagnosis not present

## 2017-04-21 MED ORDER — FAMOTIDINE 20 MG PO TABS: 20.0000 mg | ORAL_TABLET | Freq: Two times a day (BID) | ORAL | 2 refills | Status: DC

## 2017-04-21 NOTE — Patient Instructions (Signed)
   A referral was sent to [facility] Lonzo Candy, Au[facility]   telephone [phone]

## 2017-04-21 NOTE — Progress Notes (Signed)
  Subjective:   Patient ID: Lori [name], female    DOB: [birth year], 83 y.o.   MRN: [id number] CC: Follow-up (3 month) and Hip Pain  HPI: Lori [name] is a 83 y.o. female presenting for Follow-up (3 month) and Hip Pain  Here today with daughter Lori [name] has been feeling well, stays fairly active  Cooks about once a week Mopping and sweeping when needed Denies any dizziness Sometimes gets a little SOB if she gets very active, has to sit a few minutes to feel better then restarts what she was doing No CP  Has hip pain if she stands for too long Gets better with rest  Some ankle swelling if she lets her feet dangle for periods of time  Sometimes has epigastric pain, worse with certain foods, she isnt sure what foods make it worse  HTN: checks BP at home 130s-140s  Relevant past medical, surgical, family and social history reviewed. Allergies and medications reviewed and updated. History  Smoking Status  . Never Smoker  Smokeless Tobacco  . Never Used   ROS: Per HPI   Objective:    BP (!) 161/69   Pulse 76   Temp 98 F (36.7 C) (Oral)   Ht '5\' 2"'$  (1.575 m)   Wt 126 lb 3.2 oz (57.2 kg)   BMI 23.08 kg/m   Wt Readings from Last 3 Encounters:  04/21/17 126 lb 3.2 oz (57.2 kg)  02/01/17 127 lb 3.2 oz (57.7 kg)  10/30/16 124 lb (56.2 kg)    Gen: NAD, alert, cooperative with exam, NCAT EYES: EOMI, no conjunctival injection, or no icterus ENT:  TMs pearly gray b/l, non-obstructive cerumen present, OP without erythema LYMPH: no cervical LAD CV: NRRR, normal S1/S2 Resp: CTABL, no wheezes, normal WOB Abd: +BS, soft, minimal ttp epigastric area, ND. no guarding  Ext: No pitting edema, warm Neuro: Alert and oriented, strength equal b/l UE and LE, coordination grossly normal MSK: no pain with straight leg hip internal/external rotation b/l, no hip pain with weight bearing  Assessment & Plan:  Lori [name] was seen today for follow-up med problems.  Diagnoses and all  orders for this visit:  Anemia, unspecified type -     CBC with Differential/Platelet  Chronic kidney disease, unspecified CKD stage -     BMP8+EGFR  Hypothyroidism, unspecified type Stable, cont current med -     TSH  Gastroesophageal reflux disease, esophagitis presence not specified Not every day Trial of below, avoid foods that exacerbate symptoms Can try tums If abd pain worsening needs to be seen -     famotidine (PEPCID) 20 MG tablet; Take 1 tablet (20 mg total) by mouth 2 (two) times daily.  HTN Cont to check BP at home, let me know if stays elevated  Follow up plan: Return in about 6 months (around 10/22/2017). Assunta Found, MD [place]

## 2017-04-22 LAB — BMP8+EGFR
BUN/Creatinine Ratio: 16 (ref 12–28)
BUN: 14 mg/dL (ref 10–36)
CALCIUM: 8.7 mg/dL (ref 8.7–10.3)
CHLORIDE: 96 mmol/L (ref 96–106)
CO2: 25 mmol/L (ref 20–29)
Creatinine, Ser: 0.88 mg/dL (ref 0.57–1.00)
GFR, EST AFRICAN AMERICAN: 64 mL/min/{1.73_m2} (ref >59)
GFR, EST NON AFRICAN AMERICAN: 56 mL/min/{1.73_m2} — AB (ref >59)
Glucose: 85 mg/dL (ref 65–99)
POTASSIUM: 5.1 mmol/L (ref 3.5–5.2)
SODIUM: 135 mmol/L (ref 134–144)

## 2017-04-22 LAB — CBC WITH DIFFERENTIAL/PLATELET
Basophils Absolute: 0.1 10*3/uL (ref 0.0–0.2)
Basos: 1 %
EOS (ABSOLUTE): 0.4 10*3/uL (ref 0.0–0.4)
Eos: 5 %
Hematocrit: 34.6 % (ref 34.0–46.6)
Hemoglobin: 11.4 g/dL (ref 11.1–15.9)
IMMATURE GRANS (ABS): 0 10*3/uL (ref 0.0–0.1)
Immature Granulocytes: 0 %
LYMPHS ABS: 3.2 10*3/uL — AB (ref 0.7–3.1)
LYMPHS: 41 %
MCH: 25.7 pg — AB (ref 26.6–33.0)
MCHC: 32.9 g/dL (ref 31.5–35.7)
MCV: 78 fL — AB (ref 79–97)
MONOS ABS: 0.8 10*3/uL (ref 0.1–0.9)
Monocytes: 11 %
NEUTROS ABS: 3.3 10*3/uL (ref 1.4–7.0)
Neutrophils: 42 %
PLATELETS: 237 10*3/uL (ref 150–379)
RBC: 4.44 x10E6/uL (ref 3.77–5.28)
RDW: 16.5 % — AB (ref 12.3–15.4)
WBC: 7.8 10*3/uL (ref 3.4–10.8)

## 2017-04-22 LAB — TSH: TSH: 2.52 u[IU]/mL (ref 0.450–4.500)

## 2017-05-13 ENCOUNTER — Ambulatory Visit: Payer: Medicare Other | Admitting: Audiology

## 2017-05-20 ENCOUNTER — Other Ambulatory Visit: Payer: Self-pay | Admitting: Pediatrics

## 2017-06-30 ENCOUNTER — Inpatient Hospital Stay (HOSPITAL_COMMUNITY)
Admission: EM | Admit: 2017-06-30 | Discharge: 2017-07-03 | DRG: 302 | Disposition: A | Discharge status: Home / Self Care | Payer: Medicare Other | Attending: Cardiovascular Disease | Admitting: Cardiovascular Disease

## 2017-06-30 ENCOUNTER — Emergency Department (HOSPITAL_COMMUNITY): Payer: Medicare Other

## 2017-06-30 DIAGNOSIS — I5022 Chronic systolic (congestive) heart failure: Secondary | ICD-10-CM | POA: Diagnosis present

## 2017-06-30 DIAGNOSIS — I5023 Acute on chronic systolic (congestive) heart failure: Secondary | ICD-10-CM

## 2017-06-30 DIAGNOSIS — I5043 Acute on chronic combined systolic (congestive) and diastolic (congestive) heart failure: Secondary | ICD-10-CM | POA: Diagnosis present

## 2017-06-30 DIAGNOSIS — Z9861 Coronary angioplasty status: Secondary | ICD-10-CM

## 2017-06-30 DIAGNOSIS — R079 Chest pain, unspecified: Secondary | ICD-10-CM | POA: Diagnosis not present

## 2017-06-30 DIAGNOSIS — I2510 Atherosclerotic heart disease of native coronary artery without angina pectoris: Secondary | ICD-10-CM | POA: Diagnosis not present

## 2017-06-30 DIAGNOSIS — I200 Unstable angina: Secondary | ICD-10-CM | POA: Diagnosis not present

## 2017-06-30 DIAGNOSIS — N189 Chronic kidney disease, unspecified: Secondary | ICD-10-CM | POA: Diagnosis present

## 2017-06-30 DIAGNOSIS — L8990 Pressure ulcer of unspecified site, unspecified stage: Secondary | ICD-10-CM | POA: Insufficient documentation

## 2017-06-30 DIAGNOSIS — I493 Ventricular premature depolarization: Secondary | ICD-10-CM | POA: Diagnosis not present

## 2017-06-30 DIAGNOSIS — N183 Chronic kidney disease, stage 3 (moderate): Secondary | ICD-10-CM | POA: Diagnosis present

## 2017-06-30 DIAGNOSIS — R0602 Shortness of breath: Secondary | ICD-10-CM | POA: Diagnosis not present

## 2017-06-30 DIAGNOSIS — L89151 Pressure ulcer of sacral region, stage 1: Secondary | ICD-10-CM | POA: Diagnosis present

## 2017-06-30 DIAGNOSIS — I130 Hypertensive heart and chronic kidney disease with heart failure and stage 1 through stage 4 chronic kidney disease, or unspecified chronic kidney disease: Secondary | ICD-10-CM | POA: Diagnosis present

## 2017-06-30 DIAGNOSIS — I255 Ischemic cardiomyopathy: Secondary | ICD-10-CM | POA: Diagnosis present

## 2017-06-30 DIAGNOSIS — M1990 Unspecified osteoarthritis, unspecified site: Secondary | ICD-10-CM | POA: Diagnosis present

## 2017-06-30 DIAGNOSIS — I272 Pulmonary hypertension, unspecified: Secondary | ICD-10-CM | POA: Diagnosis present

## 2017-06-30 DIAGNOSIS — I10 Essential (primary) hypertension: Secondary | ICD-10-CM | POA: Diagnosis present

## 2017-06-30 DIAGNOSIS — I25110 Atherosclerotic heart disease of native coronary artery with unstable angina pectoris: Principal | ICD-10-CM | POA: Diagnosis present

## 2017-06-30 DIAGNOSIS — I252 Old myocardial infarction: Secondary | ICD-10-CM

## 2017-06-30 DIAGNOSIS — Z7902 Long term (current) use of antithrombotics/antiplatelets: Secondary | ICD-10-CM

## 2017-06-30 DIAGNOSIS — Z8249 Family history of ischemic heart disease and other diseases of the circulatory system: Secondary | ICD-10-CM

## 2017-06-30 DIAGNOSIS — E039 Hypothyroidism, unspecified: Secondary | ICD-10-CM | POA: Diagnosis present

## 2017-06-30 DIAGNOSIS — Z7982 Long term (current) use of aspirin: Secondary | ICD-10-CM

## 2017-06-30 DIAGNOSIS — E785 Hyperlipidemia, unspecified: Secondary | ICD-10-CM | POA: Diagnosis present

## 2017-06-30 DIAGNOSIS — Z8261 Family history of arthritis: Secondary | ICD-10-CM

## 2017-06-30 LAB — CBC WITH DIFFERENTIAL/PLATELET
BASOS ABS: 0.1 10*3/uL (ref 0.0–0.1)
BASOS PCT: 0 %
Eosinophils Absolute: 0.4 10*3/uL (ref 0.0–0.7)
Eosinophils Relative: 4 %
HCT: 39.8 % (ref 36.0–46.0)
HEMOGLOBIN: 12.5 g/dL (ref 12.0–15.0)
Lymphocytes Relative: 24 %
Lymphs Abs: 2.8 10*3/uL (ref 0.7–4.0)
MCH: 25.9 pg — ABNORMAL LOW (ref 26.0–34.0)
MCHC: 31.4 g/dL (ref 30.0–36.0)
MCV: 82.6 fL (ref 78.0–100.0)
MONO ABS: 0.9 10*3/uL (ref 0.1–1.0)
Monocytes Relative: 8 %
NEUTROS ABS: 7.5 10*3/uL (ref 1.7–7.7)
NEUTROS PCT: 64 %
Platelets: 204 10*3/uL (ref 150–400)
RBC: 4.82 MIL/uL (ref 3.87–5.11)
RDW: 15.4 % (ref 11.5–15.5)
WBC: 11.6 10*3/uL — AB (ref 4.0–10.5)

## 2017-06-30 LAB — TROPONIN I: TROPONIN I: 0.05 ng/mL — AB (ref <0.03)

## 2017-06-30 LAB — COMPREHENSIVE METABOLIC PANEL
ALBUMIN: 3.5 g/dL (ref 3.5–5.0)
ALT: 24 U/L (ref 14–54)
AST: 41 U/L (ref 15–41)
Alkaline Phosphatase: 65 U/L (ref 38–126)
Anion gap: 10 (ref 5–15)
BILIRUBIN TOTAL: 0.8 mg/dL (ref 0.3–1.2)
BUN: 19 mg/dL (ref 6–20)
CO2: 24 mmol/L (ref 22–32)
CREATININE: 1.12 mg/dL — AB (ref 0.44–1.00)
Calcium: 8.6 mg/dL — ABNORMAL LOW (ref 8.9–10.3)
Chloride: 101 mmol/L (ref 101–111)
GFR calc Af Amer: 47 mL/min — ABNORMAL LOW (ref >60)
GFR calc non Af Amer: 40 mL/min — ABNORMAL LOW (ref >60)
GLUCOSE: 107 mg/dL — AB (ref 65–99)
POTASSIUM: 4.5 mmol/L (ref 3.5–5.1)
Sodium: 135 mmol/L (ref 135–145)
Total Protein: 6 g/dL — ABNORMAL LOW (ref 6.5–8.1)

## 2017-06-30 LAB — MAGNESIUM: Magnesium: 1.8 mg/dL (ref 1.7–2.4)

## 2017-06-30 LAB — BRAIN NATRIURETIC PEPTIDE: B Natriuretic Peptide: 665.4 pg/mL — ABNORMAL HIGH (ref 0.0–100.0)

## 2017-06-30 LAB — HEPARIN LEVEL (UNFRACTIONATED): Heparin Unfractionated: 0.27 IU/mL — ABNORMAL LOW (ref 0.30–0.70)

## 2017-06-30 LAB — I-STAT TROPONIN, ED: TROPONIN I, POC: 0.01 ng/mL (ref 0.00–0.08)

## 2017-06-30 MED ORDER — CLOPIDOGREL BISULFATE 75 MG PO TABS
75.0000 mg | ORAL_TABLET | Freq: Every day | ORAL | Status: DC
  Administered 2017-06-30 – 2017-07-03 (×4): 75 mg via ORAL
  Filled 2017-06-30 (×4): qty 1

## 2017-06-30 MED ORDER — ASPIRIN EC 81 MG PO TBEC
81.0000 mg | DELAYED_RELEASE_TABLET | Freq: Every day | ORAL | Status: DC
  Administered 2017-07-01 – 2017-07-03 (×3): 81 mg via ORAL
  Filled 2017-06-30 (×3): qty 1

## 2017-06-30 MED ORDER — ASPIRIN 81 MG PO CHEW
324.0000 mg | CHEWABLE_TABLET | Freq: Once | ORAL | Status: DC
  Filled 2017-06-30: qty 4

## 2017-06-30 MED ORDER — NITROGLYCERIN 0.4 MG SL SUBL
0.4000 mg | SUBLINGUAL_TABLET | SUBLINGUAL | Status: DC | PRN
  Administered 2017-07-02: 0.4 mg via SUBLINGUAL
  Filled 2017-06-30: qty 1

## 2017-06-30 MED ORDER — ACETAMINOPHEN 325 MG PO TABS: 650.0000 mg | ORAL_TABLET | ORAL | Status: DC | PRN

## 2017-06-30 MED ORDER — NITROGLYCERIN IN D5W 200-5 MCG/ML-% IV SOLN
0.0000 ug/min | INTRAVENOUS | Status: DC
  Administered 2017-06-30: 5 ug/min via INTRAVENOUS

## 2017-06-30 MED ORDER — NITROGLYCERIN IN D5W 200-5 MCG/ML-% IV SOLN
0.0000 ug/min | Freq: Once | INTRAVENOUS | Status: AC
  Administered 2017-06-30: 5 ug/min via INTRAVENOUS
  Filled 2017-06-30: qty 250

## 2017-06-30 MED ORDER — ISOSORBIDE MONONITRATE ER 30 MG PO TB24
15.0000 mg | ORAL_TABLET | Freq: Every day | ORAL | Status: DC
  Filled 2017-06-30: qty 1

## 2017-06-30 MED ORDER — HEPARIN BOLUS VIA INFUSION
3000.0000 [IU] | Freq: Once | INTRAVENOUS | Status: AC
  Administered 2017-06-30: 3000 [IU] via INTRAVENOUS
  Filled 2017-06-30: qty 3000

## 2017-06-30 MED ORDER — LEVOTHYROXINE SODIUM 50 MCG PO TABS
50.0000 ug | ORAL_TABLET | Freq: Every day | ORAL | Status: DC
  Administered 2017-07-01 – 2017-07-03 (×3): 50 ug via ORAL
  Filled 2017-06-30 (×3): qty 1

## 2017-06-30 MED ORDER — ATORVASTATIN CALCIUM 10 MG PO TABS
10.0000 mg | ORAL_TABLET | Freq: Every day | ORAL | Status: DC
  Administered 2017-06-30 – 2017-07-02 (×3): 10 mg via ORAL
  Filled 2017-06-30 (×3): qty 1

## 2017-06-30 MED ORDER — CARVEDILOL 3.125 MG PO TABS
3.1250 mg | ORAL_TABLET | Freq: Two times a day (BID) | ORAL | Status: DC
  Administered 2017-06-30 – 2017-07-03 (×6): 3.125 mg via ORAL
  Filled 2017-06-30 (×6): qty 1

## 2017-06-30 MED ORDER — FUROSEMIDE 10 MG/ML IJ SOLN
40.0000 mg | Freq: Once | INTRAMUSCULAR | Status: AC
  Administered 2017-06-30: 40 mg via INTRAVENOUS
  Filled 2017-06-30: qty 4

## 2017-06-30 MED ORDER — HEPARIN (PORCINE) IN NACL 100-0.45 UNIT/ML-% IJ SOLN
800.0000 [IU]/h | INTRAMUSCULAR | Status: DC
  Administered 2017-06-30: 700 [IU]/h via INTRAVENOUS
  Administered 2017-07-01: 800 [IU]/h via INTRAVENOUS
  Filled 2017-06-30 (×2): qty 250

## 2017-06-30 MED ORDER — ASPIRIN EC 81 MG PO TBEC: 81.0000 mg | DELAYED_RELEASE_TABLET | Freq: Every day | ORAL | Status: DC

## 2017-06-30 MED ORDER — ONDANSETRON HCL 4 MG/2ML IJ SOLN: 4.0000 mg | Freq: Four times a day (QID) | INTRAMUSCULAR | Status: DC | PRN

## 2017-06-30 NOTE — ED Provider Notes (Signed)
[facility] DEPT Provider Note   CSN: [id number] Arrival date & time: 06/30/17  0715     History   Chief Complaint Chief Complaint  Patient presents with  . Chest Pain    HPI Scott T Pelot is a 83 y.o. female.  HPI   83 yo F with h/o CAD s/p PCI/DDES to distal Cx, OM2 here with CP and SOB. Pt reports intermittent CP x 1 week. She describes pain as an aching, dull, substernal CP with SOB. It occurs at rest but is worse with exertion. This AM, she developed worsening dull, aching pressure and called EMS. Per EMS reoprt, pt hypertensive to 200s on arrival with diaphoresis, was given ntiro x 1 with improvement in EKG and resolution of pain. En route, just prior to arrival, she developed mild recurrence of her CP. She describes an ongoing dull pressure. No weakness. She does have nausea. No vomiting. No diarrhea. No known recent weight gain/loss. She has been adherent with her Plavix.  Past Medical History:  Diagnosis Date  . Arthritis   . CAD (coronary artery disease)    a. NSTEMI in 2011 s/p PCI/DES to distal Cx, s/p PCI/DES to OM2 (EF 40%). b. NSTEMI 02/2012 treated conservatively.  . Carotid artery stenosis    Left carotid endarterectomy 02/2001  . CHF (congestive heart failure) ([place])    a. EF 40% in 2011. b. EF 35-40% by echo 03/12/12.  [name] Kitchen Hyperlipidemia   . Hypertension   . Hyponatremia    Noted on 02/2012 admission  . Pneumonia    02/2012  . Renal insufficiency    Noted on 02/2012 admission  . Respiratory failure ([place])    02/2012 - requiring bipap - multifactorial in setting of PNA/CHF/NSTEMI  . Thyroid disease    Euthyroid 02/2012    Patient Active Problem List   Diagnosis Date Noted  . Unstable angina ([place]) 06/30/2017  . Hypothyroidism 08/03/2016  . Venous (peripheral) insufficiency 03/19/2016  . Vertebrobasilar insufficiency 10/08/2015  . Cervical spondylosis without myelopathy 10/08/2015  . Carotid sinus syncope 10/08/2015  . History of Clostridium difficile  10/01/2014  . Hemorrhoids 06/11/2014  . Ileus ([place]) 08/08/2013  . Spinal stenosis of lumbar region 05/26/2013  . Chronic systolic heart failure ([place]) 03/21/2012  . Ischemic cardiomyopathy 03/21/2012  . CKD (chronic kidney disease) 03/21/2012  . NSTEMI (non-ST elevated myocardial infarction) ([place]) 03/12/2012  . Dyslipidemia 12/10/2010  . CORONARY ATHEROSCLEROSIS NATIVE CORONARY ARTERY 08/27/2010  . Accelerated hypertension 08/26/2010  . CAROTID STENOSIS 08/26/2010    Past Surgical History:  Procedure Laterality Date  . CARDIAC CATHETERIZATION    . CAROTID ENDARTERECTOMY     left 02/2001    OB History    Gravida Para Term Preterm AB Living   2 2 2     2    SAB TAB Ectopic Multiple Live Births                   Home Medications    Prior to Admission medications   Medication Sig Start Date End Date Taking? Authorizing Provider  aspirin 81 MG tablet Take 81 mg by mouth daily.     Yes [provider]  carvedilol (COREG) 3.125 MG tablet Take 1 tablet by mouth 2 times a day with meals - Stop taking metoprolol 05/21/17  Yes Eustaquio Maize, MD  clopidogrel (PLAVIX) 75 MG tablet Take 1 tablet (75 mg total) by mouth daily. 08/03/16  Yes Eustaquio Maize, MD  isosorbide mononitrate (IMDUR) 30 MG 24  hr tablet Take 0.5 tablets (15 mg total) by mouth daily. 07/08/16  Yes Minus Breeding, MD  levothyroxine (SYNTHROID, LEVOTHROID) 50 MCG tablet Take 1 Tablet by mouth once daily before breakfast 09/08/16  Yes Hawks, Christy A, FNP  famotidine (PEPCID) 20 MG tablet Take 1 tablet (20 mg total) by mouth 2 (two) times daily. Patient not taking: Reported on 06/30/2017 04/21/17   Eustaquio Maize, MD  furosemide (LASIX) 40 MG tablet Take 1 tablet (40 mg total) by mouth daily. Patient not taking: Reported on 04/21/2017 07/08/16   Minus Breeding, MD  ketoconazole (NIZORAL) 2 % cream Apply topically daily behind ears when sore, as directed 04/22/17   Eustaquio Maize, MD  triamcinolone (KENALOG)  0.025 % ointment Apply 1 application topically 2 (two) times daily. 02/01/17   Eustaquio Maize, MD    Family History Family History  Problem Relation Age of Onset  . Heart failure Mother 56  . Rheum arthritis Father        prostate  . Prostate cancer Unknown   . Coronary artery disease Brother 76       CABG, alive at 55  . Coronary artery disease Brother 66       Died  . Heart failure Sister   . Heart failure Sister     Social History Social History  Substance Use Topics  . Smoking status: Never Smoker  . Smokeless tobacco: Never Used  . Alcohol use No     Allergies   Patient has no known allergies.   Review of Systems Review of Systems  Constitutional: Positive for fatigue.  Respiratory: Positive for chest tightness and shortness of breath.   Gastrointestinal: Positive for nausea.  All other systems reviewed and are negative.    Physical Exam Updated Vital Signs BP (!) 147/89   Pulse 87   Temp 97.7 F (36.5 C) (Oral)   Resp 17   Ht 5\' 4"  (1.626 m)   Wt 54 kg (119 lb)   SpO2 94%   BMI 20.43 kg/m   Physical Exam  Constitutional: She is oriented to person, place, and time. She appears well-developed and well-nourished. She appears distressed.  HENT:  Head: Normocephalic and atraumatic.  Eyes: Conjunctivae are normal.  Neck: Neck supple.  Cardiovascular: Normal rate, regular rhythm and normal heart sounds.  Exam reveals no friction rub.   No murmur heard. Pulmonary/Chest: Effort normal. Tachypnea noted. No respiratory distress. She has wheezes. She has no rales.  Abdominal: She exhibits no distension.  Musculoskeletal: She exhibits no edema.  Neurological: She is alert and oriented to person, place, and time. She exhibits normal muscle tone.  Skin: Skin is warm. Capillary refill takes less than 2 seconds.  Psychiatric: She has a normal mood and affect.  Nursing note and vitals reviewed.    ED Treatments / Results  Labs (all labs ordered are  listed, but only abnormal results are displayed) Labs Reviewed  CBC WITH DIFFERENTIAL/PLATELET - Abnormal; Notable for the following:       Result Value   WBC 11.6 (*)    MCH 25.9 (*)    All other components within normal limits  COMPREHENSIVE METABOLIC PANEL - Abnormal; Notable for the following:    Glucose, Bld 107 (*)    Creatinine, Ser 1.12 (*)    Calcium 8.6 (*)    Total Protein 6.0 (*)    GFR calc non Af Amer 40 (*)    GFR calc Af Amer 47 (*)  All other components within normal limits  BRAIN NATRIURETIC PEPTIDE - Abnormal; Notable for the following:    B Natriuretic Peptide 665.4 (*)    All other components within normal limits  TROPONIN I  HEPARIN LEVEL (UNFRACTIONATED)  MAGNESIUM  TROPONIN I  TROPONIN I  TROPONIN I  HEPARIN LEVEL (UNFRACTIONATED)  CBC  BASIC METABOLIC PANEL  LIPID PANEL  I-STAT TROPONIN, ED    EKG  EKG Interpretation  Date/Time:  Wednesday June 30 2017 07:39:47 EDT Ventricular Rate:  87 PR Interval:    QRS Duration: 88 QT Interval:  389 QTC Calculation: 468 R Axis:   59 Text Interpretation:  Sinus rhythm Borderline prolonged PR interval Probable LVH with secondary repol abnrm Anterior Q waves, possibly due to LVH No significant change since last tracing Confirmed by Duffy Bruce [phone]) on 06/30/2017 7:42:18 AM       Radiology Dg Chest Portable 1 View  Result Date: 06/30/2017 CLINICAL DATA:  Short of breath, wheezing EXAM: PORTABLE CHEST 1 VIEW COMPARISON:  10/25/2016 FINDINGS: Normal cardiac silhouette. Small bilateral pleural effusions. Chronic bronchitic markings and hyperinflated lungs. Skin fold noted along the LEFT lateral chest wall. Linear peripheral interstitial markings. IMPRESSION: Interstitial edema with bilateral small effusions. Electronically Signed   By: Suzy Bouchard M.D.   On: 06/30/2017 08:09    Procedures .Critical Care Performed by: Duffy Bruce Authorized by: Duffy Bruce   Critical care provider  statement:    Critical care time (minutes):  35   Critical care time was exclusive of:  Separately billable procedures and treating other patients and teaching time   Critical care was necessary to treat or prevent imminent or life-threatening deterioration of the following conditions:  Circulatory failure, cardiac failure and respiratory failure   Critical care was time spent personally by me on the following activities:  Development of treatment plan with patient or surrogate, discussions with consultants, evaluation of patient's response to treatment, examination of patient, obtaining history from patient or surrogate, ordering and performing treatments and interventions, ordering and review of laboratory studies, ordering and review of radiographic studies, pulse oximetry, re-evaluation of patient's condition and review of old charts   I assumed direction of critical care for this patient from another provider in my specialty: no      (including critical care time)  Medications Ordered in ED Medications  aspirin chewable tablet 324 mg (324 mg Oral Not Given 06/30/17 0744)  nitroGLYCERIN (NITROSTAT) SL tablet 0.4 mg (not administered)  heparin ADULT infusion 100 units/mL (25000 units/229mL sodium chloride 0.45%) (700 Units/hr Intravenous Rate/Dose Verify 06/30/17 1803)  carvedilol (COREG) tablet 3.125 mg (3.125 mg Oral Given 06/30/17 1839)  levothyroxine (SYNTHROID, LEVOTHROID) tablet 50 mcg (50 mcg Oral Not Given 06/30/17 1800)  clopidogrel (PLAVIX) tablet 75 mg (75 mg Oral Given 06/30/17 1839)  isosorbide mononitrate (IMDUR) 24 hr tablet 15 mg (not administered)  aspirin EC tablet 81 mg (not administered)  acetaminophen (TYLENOL) tablet 650 mg (not administered)  ondansetron (ZOFRAN) injection 4 mg (not administered)  nitroGLYCERIN 50 mg in dextrose 5 % 250 mL (0.2 mg/mL) infusion (5 mcg/min Intravenous Rate/Dose Verify 06/30/17 1803)  atorvastatin (LIPITOR) tablet 10 mg (10 mg Oral Given  06/30/17 1839)  nitroGLYCERIN 50 mg in dextrose 5 % 250 mL (0.2 mg/mL) infusion (5 mcg/min Intravenous New Bag/Given 06/30/17 0744)  heparin bolus via infusion 3,000 Units (3,000 Units Intravenous Bolus from Bag 06/30/17 0801)  furosemide (LASIX) injection 40 mg (40 mg Intravenous Given 06/30/17 0844)     Initial  Impression / Assessment and Plan / ED Course  I have reviewed the triage vital signs and the nursing notes.  Pertinent labs & imaging results that were available during my care of the patient were reviewed by me and considered in my medical decision making (see chart for details).     83 yo F with PMHx known CAD here with substernal chest pressure. EKG from EMS is c/f possible initially ST elevation in V1-3 with reciprocal changes in V5-6; now improved s/p nitro but I am concerned for ongoing ACS/NSTEMI. Will give ASA, nitro and consult cards emergently for evaluation. CXR pending. Pt with new Q waves in anterior leads compared to last EKG in 06/2016. Will also start heparin gtt empirically 2/2 high concern for ACS. DDx includes HTN emergency, CHF exacerbation.  Labs, imaging as above. CXR c/f pulmonary edema. Trop neg which is reassuring. Sx may be 2/2 demand in setting of HTN from CHF exacerbation. Lasix given. Admit to Cardiology. CP resolved.  Final Clinical Impressions(s) / ED Diagnoses   Final diagnoses:  Acute on chronic systolic congestive heart failure ([place])  Chest pain with high risk for cardiac etiology      Duffy Bruce, MD 06/30/17 2004

## 2017-06-30 NOTE — ED Notes (Signed)
Pt meal at bedside. Family remains with pt.

## 2017-06-30 NOTE — ED Notes (Signed)
Cardiology at bedside.

## 2017-06-30 NOTE — H&P (Addendum)
Cardiology H and P   Patient ID: Lori [name]; [id number]; 14-Dec-1918   Admit date: 06/30/2017 Date of Consult: 06/30/2017  Primary Care Provider: Eustaquio Maize, MD Primary Cardiologist: Dr. Percival Spanish Primary Electrophysiologist:     Patient Profile:   Lori [name] is a 83 y.o. female with a hx of CAD s/p NSTEMI x 2 with PCI and DES to distal Cx and OM2 (2011), NSTEMI in 2013 treated conservatively, HTN, HLD, hx of chronic systolic heart failure, carotid artery stenosis s/p LCEA (2002) who is being seen today for the evaluation of chest pain.  History of Present Illness:   Lori [name] is known to our service and last saw Dr. Percival Spanish in clinic on 07/08/16. She was being followed for previous syncopal episode. Holter monitor did not reveal arrhythmias. She was continued on ASA and plavix for known CAD and NSTEMI that is being managed medically.  She is not compliant on her lasix secondary to frequent bathroom trips. She has lasix that she can use PRN. She is compliant on other medications. She limits her fluid intake.  She woke up this morning at 0500 to use the bathroom. She began to have substernal chest pain that felt like a pressure. The pain was worse than usual and didn't go away with rest. She was very diaphoretic, but denies shortness of breath, palpitations, dizziness, and syncope.  She called her daughter who then called EMS. She vomited one time with EMS on the way to the ED. She received nitro which relieved her pain. She states that she normally has chest pain on the way to the bathroom each morning, but the pain is manageable and goes away with rest.   Initial troponin was negative, continue to trend. EKG with ST elevation in V1-3 and depression in V5/V6. CXR negative for infectious process, but with interstitial edema and bilateral small pleural effusion.   Past Medical History:  Diagnosis Date  . Arthritis   . CAD (coronary artery disease)    a. NSTEMI in  2011 s/p PCI/DES to distal Cx, s/p PCI/DES to OM2 (EF 40%). b. NSTEMI 02/2012 treated conservatively.  . Carotid artery stenosis    Left carotid endarterectomy 02/2001  . CHF (congestive heart failure) ([place])    a. EF 40% in 2011. b. EF 35-40% by echo 03/12/12.  [name] Kitchen Hyperlipidemia   . Hypertension   . Hyponatremia    Noted on 02/2012 admission  . Pneumonia    02/2012  . Renal insufficiency    Noted on 02/2012 admission  . Respiratory failure ([place])    02/2012 - requiring bipap - multifactorial in setting of PNA/CHF/NSTEMI  . Thyroid disease    Euthyroid 02/2012    Past Surgical History:  Procedure Laterality Date  . CARDIAC CATHETERIZATION    . CAROTID ENDARTERECTOMY     left 02/2001     Home Medications:  Prior to Admission medications   Medication Sig Start Date End Date Taking? Authorizing Provider  aspirin 81 MG tablet Take 81 mg by mouth daily.     Yes [provider]  carvedilol (COREG) 3.125 MG tablet Take 1 tablet by mouth 2 times a day with meals - Stop taking metoprolol 05/21/17  Yes Eustaquio Maize, MD  clopidogrel (PLAVIX) 75 MG tablet Take 1 tablet (75 mg total) by mouth daily. 08/03/16  Yes Eustaquio Maize, MD  isosorbide mononitrate (IMDUR) 30 MG 24 hr tablet Take 0.5 tablets (15 mg total) by mouth daily. 07/08/16  Yes  Minus Breeding, MD  levothyroxine (SYNTHROID, LEVOTHROID) 50 MCG tablet Take 1 Tablet by mouth once daily before breakfast 09/08/16  Yes Hawks, Christy A, FNP  famotidine (PEPCID) 20 MG tablet Take 1 tablet (20 mg total) by mouth 2 (two) times daily. Patient not taking: Reported on 06/30/2017 04/21/17   Eustaquio Maize, MD  furosemide (LASIX) 40 MG tablet Take 1 tablet (40 mg total) by mouth daily. Patient not taking: Reported on 04/21/2017 07/08/16   Minus Breeding, MD  ketoconazole (NIZORAL) 2 % cream Apply topically daily behind ears when sore, as directed 04/22/17   Eustaquio Maize, MD  triamcinolone (KENALOG) 0.025 % ointment Apply 1 application  topically 2 (two) times daily. 02/01/17   Eustaquio Maize, MD    Inpatient Medications: Scheduled Meds: . aspirin  324 mg Oral Once   Continuous Infusions: . heparin 700 Units/hr (06/30/17 0801)   PRN Meds: nitroGLYCERIN  Allergies:   No Known Allergies  Social History:   Social History   Social History  . Marital status: Married    Spouse name: N/A  . Number of children: N/A  . Years of education: N/A   Occupational History  . RETIRED Retired    [employer]   Social History Main Topics  . Smoking status: Never Smoker  . Smokeless tobacco: Never Used  . Alcohol use No  . Drug use: No  . Sexual activity: Not Currently   Other Topics Concern  . Not on file   Social History Narrative   Lives at home with husband.      Family History:    Family History  Problem Relation Age of Onset  . Heart failure Mother 32  . Rheum arthritis Father        prostate  . Prostate cancer Unknown   . Coronary artery disease Brother 1       CABG, alive at 15  . Coronary artery disease Brother 51       Died  . Heart failure Sister   . Heart failure Sister      ROS:  Please see the history of present illness.  ROS  All other ROS reviewed and negative.     Physical Exam/Data:   Vitals:   06/30/17 0745 06/30/17 0800 06/30/17 0830 06/30/17 0900  BP: (!) 156/69 140/65 128/76 (!) 149/62  Pulse: 91 82 83 85  Resp: (!) 35 20 19 (!) 22  Temp:      TempSrc:      SpO2: 98% 98% 98% 100%  Weight:      Height:       No intake or output data in the 24 hours ending 06/30/17 0918 Filed Weights   06/30/17 0729  Weight: 120 lb (54.4 kg)   Body mass index is 20.6 kg/m.  General:  Well nourished, well developed, elderly female in no acute distress HEENT: normal Neck: no JVD Vascular: No carotid bruits Cardiac:  normal S1, S2; RRR; no murmur Lungs:  clear to auscultation bilaterally, no wheezing, rhonchi or rales  Abd: soft, nontender, no hepatomegaly  Ext: trace  edema Musculoskeletal:  No deformities, BUE and BLE strength normal and equal Skin: warm and dry  Neuro:  CNs 2-12 intact, no focal abnormalities noted Psych:  Normal affect   EKG:  The EKG was personally reviewed and demonstrates:  Sinus with ST elevation in V1-3, ST depression in V5/6 Telemetry:  Telemetry was personally reviewed and demonstrates:  sinus  Relevant CV Studies:  Echocardiogram  03/12/12: Study Conclusions - Left ventricle: The cavity size was normal. Wall thickness was increased in a pattern of mild LVH. There appears to be severe hypokinesis of the apex and the apical anterior/septal/lateral/inferior segments. Systolic function was moderately reduced. The estimated ejection fraction was in the range of 35% to 40%. Doppler parameters are consistent with abnormal left ventricular relaxation (grade 1 diastolic dysfunction). - Aortic valve: There was no stenosis. Trivial regurgitation. - Mitral valve: Mild regurgitation. - Left atrium: The atrium was mildly to moderately dilated. - Right ventricle: The cavity size was normal. Systolic function was normal. - Tricuspid valve: Peak RV-RA gradient: 62mm Hg (S). - Pulmonary arteries: PA peak pressure: 77mm Hg (S). - Systemic veins: IVC measured 2.3 cm with some respirophasic variation, suggesting RA pressure 10 mmHg.  Impressions: - Normal LV size with mild LV hypertrophy. EF 35-40% with peri-apical hypokinesis. Normal RV size and systolic function. Mild pulmonary hypertension.   Laboratory Data:  Chemistry Recent Labs Lab 06/30/17 0714  NA 135  K 4.5  CL 101  CO2 24  GLUCOSE 107*  BUN 19  CREATININE 1.12*  CALCIUM 8.6*  GFRNONAA 40*  GFRAA 47*  ANIONGAP 10     Recent Labs Lab 06/30/17 0714  PROT 6.0*  ALBUMIN 3.5  AST 41  ALT 24  ALKPHOS 65  BILITOT 0.8   Hematology Recent Labs Lab 06/30/17 0714  WBC 11.6*  RBC 4.82  HGB 12.5  HCT 39.8  MCV 82.6  MCH 25.9*    MCHC 31.4  RDW 15.4  PLT 204   Cardiac Enzymes Recent Labs Lab 06/30/17 0714  TROPONINI <0.03    Recent Labs Lab 06/30/17 0757  TROPIPOC 0.01    BNP Recent Labs Lab 06/30/17 0714  BNP 665.4*    DDimer No results for input(s): DDIMER in the last 168 hours.  Radiology/Studies:  Dg Chest Portable 1 View  Result Date: 06/30/2017 CLINICAL DATA:  Short of breath, wheezing EXAM: PORTABLE CHEST 1 VIEW COMPARISON:  10/25/2016 FINDINGS: Normal cardiac silhouette. Small bilateral pleural effusions. Chronic bronchitic markings and hyperinflated lungs. Skin fold noted along the LEFT lateral chest wall. Linear peripheral interstitial markings. IMPRESSION: Interstitial edema with bilateral small effusions. Electronically Signed   By: Suzy Bouchard M.D.   On: 06/30/2017 08:09    Assessment and Plan:   1. CAD, hx of NSTEMI 2011 s/p PCI/DES to distal Cx, s/p PCI/DES to OM2; NSTEMI 2013 treated conservatively - troponin x 2 negative - EKG with ST depression in V5/V6 that is new, mild ST elevation in V1-3 Pt has known coronary disease that has been treated medically - on ASA and plavix daily. I have high suspicion that this is ACS. She is on heparin drip and nitro drip. She is currently chest pain fee. Will discuss with attending utility of heart catheterization in a patient that is already being managed medially. Imdur might be a good option for her since she is having frequent chest pain that is relieved with rest.    2. Chronic systolic and diastolic heart failure - pt does not take lasix at home, but uses it as PRN - she restricts her fluid intake - she does not appear volume overloaded on exam and is not SOB - continue home meds of coreg, imdur   3. HTN - continue home meds - on nitro drip   Plan to admit patient to cardiology. Continue heparin drip for 48 hrs. Will add imdur and titrate off nitro drip. Restart coreg and titrate  as tolerated.   For questions or updates,  please contact [place] Please consult [email or web address] for contact info under Cardiology/STEMI.   Signed, Tami Lin [place], [place]  06/30/2017 9:18 AM   Attending Note:   The patient was seen and examined.  Agree with assessment and plan as noted above.  Changes made to the above note as needed.  Patient seen and independently examined with Doreene Adas  PA .   We discussed all aspects of the encounter. I agree with the assessment and plan as stated above.  1. Unstable angina: The patient presents with symptoms that are consistent with unstable angina. She has ST segment depression in anterolateral leads. She has known coronary artery disease.  She is 83 years old and is at high risk for complications if we consider coronary angiogram and possible PCI. I take her best option is to be very aggressive with medical therapy. She does not have nitroglycerin at home. For some reason, she has also stopped her carvedilol. We will restart/increase her dose of carvedilol. We will add isosorbide to her medical regimen. We will give her nitroglycerin to take as much as she needs at home.  I discussed the case with her daughter, [name]. We discussed the fact that she is at high risk if we decide to do heart catheterization. If she fails medical therapy and has persistent angina and ST segment changes, we can consider high-risk angioplasty at that time.   I have spent a total of 40 minutes with patient reviewing hospital  notes , telemetry, EKGs, labs and examining patient as well as establishing an assessment and plan that was discussed with the patient. > 50% of time was spent in direct patient care.    Thayer Headings, Brooke Bonito., MD, [facility] 06/30/2017, 10:55 AM [address],  [place] Pager [phone]

## 2017-06-30 NOTE — Progress Notes (Signed)
ANTICOAGULATION CONSULT NOTE  Pharmacy Consult for heparin Indication: ACS  No Known Allergies  Patient Measurements: Height: 5\' 4"  (162.6 cm) Weight: 119 lb (54 kg) IBW/kg (Calculated) : 54.7 Heparin Dosing Weight: 54.4 kg  Vital Signs: Temp: 97.7 F (36.5 C) (10/10 1655) Temp Source: Oral (10/10 1655) BP: 147/89 (10/10 1839) Pulse Rate: 87 (10/10 1655)  Labs:  Recent Labs  06/30/17 0714 06/30/17 1830  HGB 12.5  --   HCT 39.8  --   PLT 204  --   HEPARINUNFRC  --  0.27*  CREATININE 1.12*  --   TROPONINI <0.03  --     Estimated Creatinine Clearance: 25 mL/min (A) (by C-G formula based on SCr of 1.12 mg/dL (H)).   Medical History: Past Medical History:  Diagnosis Date  . Arthritis   . CAD (coronary artery disease)    a. NSTEMI in 2011 s/p PCI/DES to distal Cx, s/p PCI/DES to OM2 (EF 40%). b. NSTEMI 02/2012 treated conservatively.  . Carotid artery stenosis    Left carotid endarterectomy 02/2001  . CHF (congestive heart failure) ([place])    a. EF 40% in 2011. b. EF 35-40% by echo 03/12/12.  [name] Kitchen Hyperlipidemia   . Hypertension   . Hyponatremia    Noted on 02/2012 admission  . Pneumonia    02/2012  . Renal insufficiency    Noted on 02/2012 admission  . Respiratory failure ([place])    02/2012 - requiring bipap - multifactorial in setting of PNA/CHF/NSTEMI  . Thyroid disease    Euthyroid 02/2012    Assessment: 83 yo female with hx of CAD and prior DES. She presented with chest pain and pharmacy consulted to dose heparin. -Initial heparin level is 0.27 on 700 units/hr  Goal of Therapy:  Heparin level 0.3-0.7 units/ml Monitor platelets by anticoagulation protocol: Yes    Plan:  -Increase heparin to 800 units/hr -Daily heparin level and CBC  Hildred Laser, Pharm D 06/30/2017 8:23 PM

## 2017-06-30 NOTE — ED Triage Notes (Signed)
Pt from home via [facility]. Pt c/o CP x1 wk. Endorses  SHOB, diaphoresis, n/v, & back pain. Given 325 ASA & 1NTG PTA.  Denies pain @ this time, but still endorses "heaviness in my chest." Significant cardiac hx. Pt HOH and forgot hearing aids. A&O x4. EDP @ bedside.

## 2017-06-30 NOTE — ED Notes (Signed)
Family at bedside. 

## 2017-06-30 NOTE — Consult Note (Deleted)
Cardiology Consultation:   Patient ID: Lori [name]; [id number]; 08-27-1919   Admit date: 06/30/2017 Date of Consult: 06/30/2017  Primary Care Provider: Eustaquio Maize, MD Primary Cardiologist: Dr. Percival Spanish Primary Electrophysiologist:     Patient Profile:   Lori [name] is a 83 y.o. female with a hx of CAD s/p NSTEMI x 2 with PCI and DES to distal Cx and OM2 (2011), NSTEMI in 2013 treated conservatively, HTN, HLD, hx of chronic systolic heart failure, carotid artery stenosis s/p LCEA (2002) who is being seen today for the evaluation of chest pain at the request of Dr. Ellender Hose.  History of Present Illness:   Lori [name] is known to our service and last saw Dr. Percival Spanish in clinic on 07/08/16. She was being followed for previous syncopal episode. Holter monitor did not reveal arrhythmias. She was continued on ASA and plavix for known CAD and NSTEMI that is being managed medically.  She is not compliant on her lasix secondary to frequent bathroom trips. She has lasix that she can use PRN. She is compliant on other medications. She limits her fluid intake.  She woke up this morning at 0500 to use the bathroom. She began to have substernal chest pain that felt like a pressure. The pain was worse than usual and didn't go away with rest. She was very diaphoretic, but denies shortness of breath, palpitations, dizziness, and syncope.  She called her daughter who then called EMS. She vomited one time with EMS on the way to the ED. She received nitro which relieved her pain. She states that she normally has chest pain on the way to the bathroom each morning, but the pain is manageable and goes away with rest.   Initial troponin was negative, continue to trend. EKG with ST elevation in V1-3 and depression in V5/V6. CXR negative for infectious process, but with interstitial edema and bilateral small pleural effusion.   Past Medical History:  Diagnosis Date  . Arthritis   . CAD (coronary  artery disease)    a. NSTEMI in 2011 s/p PCI/DES to distal Cx, s/p PCI/DES to OM2 (EF 40%). b. NSTEMI 02/2012 treated conservatively.  . Carotid artery stenosis    Left carotid endarterectomy 02/2001  . CHF (congestive heart failure) ([place])    a. EF 40% in 2011. b. EF 35-40% by echo 03/12/12.  [name] Kitchen Hyperlipidemia   . Hypertension   . Hyponatremia    Noted on 02/2012 admission  . Pneumonia    02/2012  . Renal insufficiency    Noted on 02/2012 admission  . Respiratory failure ([place])    02/2012 - requiring bipap - multifactorial in setting of PNA/CHF/NSTEMI  . Thyroid disease    Euthyroid 02/2012    Past Surgical History:  Procedure Laterality Date  . CARDIAC CATHETERIZATION    . CAROTID ENDARTERECTOMY     left 02/2001     Home Medications:  Prior to Admission medications   Medication Sig Start Date End Date Taking? Authorizing Provider  aspirin 81 MG tablet Take 81 mg by mouth daily.     Yes [provider]  carvedilol (COREG) 3.125 MG tablet Take 1 tablet by mouth 2 times a day with meals - Stop taking metoprolol 05/21/17  Yes Eustaquio Maize, MD  clopidogrel (PLAVIX) 75 MG tablet Take 1 tablet (75 mg total) by mouth daily. 08/03/16  Yes Eustaquio Maize, MD  isosorbide mononitrate (IMDUR) 30 MG 24 hr tablet Take 0.5 tablets (15 mg total) by mouth  daily. 07/08/16  Yes Minus Breeding, MD  levothyroxine (SYNTHROID, LEVOTHROID) 50 MCG tablet Take 1 Tablet by mouth once daily before breakfast 09/08/16  Yes Hawks, Christy A, FNP  famotidine (PEPCID) 20 MG tablet Take 1 tablet (20 mg total) by mouth 2 (two) times daily. Patient not taking: Reported on 06/30/2017 04/21/17   Eustaquio Maize, MD  furosemide (LASIX) 40 MG tablet Take 1 tablet (40 mg total) by mouth daily. Patient not taking: Reported on 04/21/2017 07/08/16   Minus Breeding, MD  ketoconazole (NIZORAL) 2 % cream Apply topically daily behind ears when sore, as directed 04/22/17   Eustaquio Maize, MD  triamcinolone (KENALOG) 0.025  % ointment Apply 1 application topically 2 (two) times daily. 02/01/17   Eustaquio Maize, MD    Inpatient Medications: Scheduled Meds: . aspirin  324 mg Oral Once   Continuous Infusions: . heparin 700 Units/hr (06/30/17 0801)   PRN Meds: nitroGLYCERIN  Allergies:   No Known Allergies  Social History:   Social History   Social History  . Marital status: Married    Spouse name: N/A  . Number of children: N/A  . Years of education: N/A   Occupational History  . RETIRED Retired    [employer]   Social History Main Topics  . Smoking status: Never Smoker  . Smokeless tobacco: Never Used  . Alcohol use No  . Drug use: No  . Sexual activity: Not Currently   Other Topics Concern  . Not on file   Social History Narrative   Lives at home with husband.      Family History:    Family History  Problem Relation Age of Onset  . Heart failure Mother 27  . Rheum arthritis Father        prostate  . Prostate cancer Unknown   . Coronary artery disease Brother 62       CABG, alive at 26  . Coronary artery disease Brother 12       Died  . Heart failure Sister   . Heart failure Sister      ROS:  Please see the history of present illness.  ROS  All other ROS reviewed and negative.     Physical Exam/Data:   Vitals:   06/30/17 0745 06/30/17 0800 06/30/17 0830 06/30/17 0900  BP: (!) 156/69 140/65 128/76 (!) 149/62  Pulse: 91 82 83 85  Resp: (!) 35 20 19 (!) 22  Temp:      TempSrc:      SpO2: 98% 98% 98% 100%  Weight:      Height:       No intake or output data in the 24 hours ending 06/30/17 0918 Filed Weights   06/30/17 0729  Weight: 120 lb (54.4 kg)   Body mass index is 20.6 kg/m.  General:  Well nourished, well developed, elderly female in no acute distress HEENT: normal Neck: no JVD Vascular: No carotid bruits Cardiac:  normal S1, S2; RRR; no murmur Lungs:  clear to auscultation bilaterally, no wheezing, rhonchi or rales  Abd: soft, nontender, no  hepatomegaly  Ext: trace edema Musculoskeletal:  No deformities, BUE and BLE strength normal and equal Skin: warm and dry  Neuro:  CNs 2-12 intact, no focal abnormalities noted Psych:  Normal affect   EKG:  The EKG was personally reviewed and demonstrates:  Sinus with ST elevation in V1-3, ST depression in V5/6 Telemetry:  Telemetry was personally reviewed and demonstrates:  sinus  Relevant  CV Studies:  Echocardiogram 03/12/12: Study Conclusions - Left ventricle: The cavity size was normal. Wall thickness was increased in a pattern of mild LVH. There appears to be severe hypokinesis of the apex and the apical anterior/septal/lateral/inferior segments. Systolic function was moderately reduced. The estimated ejection fraction was in the range of 35% to 40%. Doppler parameters are consistent with abnormal left ventricular relaxation (grade 1 diastolic dysfunction). - Aortic valve: There was no stenosis. Trivial regurgitation. - Mitral valve: Mild regurgitation. - Left atrium: The atrium was mildly to moderately dilated. - Right ventricle: The cavity size was normal. Systolic function was normal. - Tricuspid valve: Peak RV-RA gradient: 50mm Hg (S). - Pulmonary arteries: PA peak pressure: 56mm Hg (S). - Systemic veins: IVC measured 2.3 cm with some respirophasic variation, suggesting RA pressure 10 mmHg.  Impressions: - Normal LV size with mild LV hypertrophy. EF 35-40% with peri-apical hypokinesis. Normal RV size and systolic function. Mild pulmonary hypertension.   Laboratory Data:  Chemistry Recent Labs Lab 06/30/17 0714  NA 135  K 4.5  CL 101  CO2 24  GLUCOSE 107*  BUN 19  CREATININE 1.12*  CALCIUM 8.6*  GFRNONAA 40*  GFRAA 47*  ANIONGAP 10     Recent Labs Lab 06/30/17 0714  PROT 6.0*  ALBUMIN 3.5  AST 41  ALT 24  ALKPHOS 65  BILITOT 0.8   Hematology Recent Labs Lab 06/30/17 0714  WBC 11.6*  RBC 4.82  HGB 12.5  HCT 39.8    MCV 82.6  MCH 25.9*  MCHC 31.4  RDW 15.4  PLT 204   Cardiac Enzymes Recent Labs Lab 06/30/17 0714  TROPONINI <0.03    Recent Labs Lab 06/30/17 0757  TROPIPOC 0.01    BNP Recent Labs Lab 06/30/17 0714  BNP 665.4*    DDimer No results for input(s): DDIMER in the last 168 hours.  Radiology/Studies:  Dg Chest Portable 1 View  Result Date: 06/30/2017 CLINICAL DATA:  Short of breath, wheezing EXAM: PORTABLE CHEST 1 VIEW COMPARISON:  10/25/2016 FINDINGS: Normal cardiac silhouette. Small bilateral pleural effusions. Chronic bronchitic markings and hyperinflated lungs. Skin fold noted along the LEFT lateral chest wall. Linear peripheral interstitial markings. IMPRESSION: Interstitial edema with bilateral small effusions. Electronically Signed   By: Suzy Bouchard M.D.   On: 06/30/2017 08:09    Assessment and Plan:   1. CAD, hx of NSTEMI 2011 s/p PCI/DES to distal Cx, s/p PCI/DES to OM2; NSTEMI 2013 treated conservatively - troponin x 2 negative - EKG with ST depression in V5/V6 that is new, mild ST elevation in V1-3 Pt has known coronary disease that has been treated medically - on ASA and plavix daily. I have high suspicion that this is ACS. She is on heparin drip and nitro drip. She is currently chest pain fee. Will discuss with attending utility of heart catheterization in a patient that is already being managed medially. Imdur might be a good option for her since she is having frequent chest pain that is relieved with rest.    2. Chronic systolic and diastolic heart failure - pt does not take lasix at home, but uses it as PRN - she restricts her fluid intake - she does not appear volume overloaded on exam and is not SOB - continue home meds of coreg, imdur   3. HTN - continue home meds - on nitro drip    For questions or updates, please contact [place] Please consult [email or web address] for contact info under Cardiology/STEMI.   Signed,  Tami Lin Randell Teare, [place]   06/30/2017 9:18 AM

## 2017-06-30 NOTE — Progress Notes (Signed)
ANTICOAGULATION CONSULT NOTE - Initial Consult  Pharmacy Consult for heparin Indication: ACS  No Known Allergies  Patient Measurements: Height: 5\' 4"  (162.6 cm) Weight: 120 lb (54.4 kg) IBW/kg (Calculated) : 54.7 Heparin Dosing Weight: 54.4 kg  Vital Signs: Temp: 98.1 F (36.7 C) (10/10 0722) Temp Source: Oral (10/10 0722) BP: 173/68 (10/10 0730) Pulse Rate: 87 (10/10 0730)  Labs:  Recent Labs  06/30/17 0714  HGB 12.5  HCT 39.8  PLT 204    CrCl cannot be calculated (Patient's most recent lab result is older than the maximum 21 days allowed.).   Medical History: Past Medical History:  Diagnosis Date  . Arthritis   . CAD (coronary artery disease)    a. NSTEMI in 2011 s/p PCI/DES to distal Cx, s/p PCI/DES to OM2 (EF 40%). b. NSTEMI 02/2012 treated conservatively.  . Carotid artery stenosis    Left carotid endarterectomy 02/2001  . CHF (congestive heart failure) ([place])    a. EF 40% in 2011. b. EF 35-40% by echo 03/12/12.  [name] Kitchen Hyperlipidemia   . Hypertension   . Hyponatremia    Noted on 02/2012 admission  . Pneumonia    02/2012  . Renal insufficiency    Noted on 02/2012 admission  . Respiratory failure ([place])    02/2012 - requiring bipap - multifactorial in setting of PNA/CHF/NSTEMI  . Thyroid disease    Euthyroid 02/2012    Assessment: 83 yo female with hx of CAD and prior DES. Presents to ED today with chest pain over the past week. Initial troponin is negative. Pt received asa 324 mg prior to arrival.  SCr 1.1, CBC wnl.  Goal of Therapy:  Heparin level 0.3-0.7 units/ml Monitor platelets by anticoagulation protocol: Yes    Plan:  -Heparin bolus 3000 units x1 then 700 units/hr -Daily HL, CBC -Level this afternoon   Shaunette Gassner, Jake Church 06/30/2017,7:50 AM

## 2017-07-01 ENCOUNTER — Encounter (HOSPITAL_COMMUNITY): Payer: Self-pay

## 2017-07-01 DIAGNOSIS — I5043 Acute on chronic combined systolic (congestive) and diastolic (congestive) heart failure: Secondary | ICD-10-CM | POA: Diagnosis present

## 2017-07-01 DIAGNOSIS — R079 Chest pain, unspecified: Secondary | ICD-10-CM | POA: Diagnosis not present

## 2017-07-01 DIAGNOSIS — M1990 Unspecified osteoarthritis, unspecified site: Secondary | ICD-10-CM | POA: Diagnosis present

## 2017-07-01 DIAGNOSIS — I5023 Acute on chronic systolic (congestive) heart failure: Secondary | ICD-10-CM | POA: Diagnosis not present

## 2017-07-01 DIAGNOSIS — Z9861 Coronary angioplasty status: Secondary | ICD-10-CM | POA: Diagnosis not present

## 2017-07-01 DIAGNOSIS — Z8261 Family history of arthritis: Secondary | ICD-10-CM | POA: Diagnosis not present

## 2017-07-01 DIAGNOSIS — I25110 Atherosclerotic heart disease of native coronary artery with unstable angina pectoris: Secondary | ICD-10-CM | POA: Diagnosis present

## 2017-07-01 DIAGNOSIS — I5022 Chronic systolic (congestive) heart failure: Secondary | ICD-10-CM | POA: Diagnosis not present

## 2017-07-01 DIAGNOSIS — E039 Hypothyroidism, unspecified: Secondary | ICD-10-CM | POA: Diagnosis present

## 2017-07-01 DIAGNOSIS — I200 Unstable angina: Secondary | ICD-10-CM | POA: Diagnosis not present

## 2017-07-01 DIAGNOSIS — I255 Ischemic cardiomyopathy: Secondary | ICD-10-CM | POA: Diagnosis not present

## 2017-07-01 DIAGNOSIS — Z8249 Family history of ischemic heart disease and other diseases of the circulatory system: Secondary | ICD-10-CM | POA: Diagnosis not present

## 2017-07-01 DIAGNOSIS — L89151 Pressure ulcer of sacral region, stage 1: Secondary | ICD-10-CM | POA: Diagnosis present

## 2017-07-01 DIAGNOSIS — E785 Hyperlipidemia, unspecified: Secondary | ICD-10-CM | POA: Diagnosis not present

## 2017-07-01 DIAGNOSIS — I130 Hypertensive heart and chronic kidney disease with heart failure and stage 1 through stage 4 chronic kidney disease, or unspecified chronic kidney disease: Secondary | ICD-10-CM | POA: Diagnosis present

## 2017-07-01 DIAGNOSIS — N183 Chronic kidney disease, stage 3 (moderate): Secondary | ICD-10-CM | POA: Diagnosis present

## 2017-07-01 DIAGNOSIS — Z7982 Long term (current) use of aspirin: Secondary | ICD-10-CM | POA: Diagnosis not present

## 2017-07-01 DIAGNOSIS — Z7902 Long term (current) use of antithrombotics/antiplatelets: Secondary | ICD-10-CM | POA: Diagnosis not present

## 2017-07-01 DIAGNOSIS — I252 Old myocardial infarction: Secondary | ICD-10-CM | POA: Diagnosis not present

## 2017-07-01 DIAGNOSIS — I493 Ventricular premature depolarization: Secondary | ICD-10-CM | POA: Diagnosis not present

## 2017-07-01 DIAGNOSIS — I272 Pulmonary hypertension, unspecified: Secondary | ICD-10-CM | POA: Diagnosis present

## 2017-07-01 DIAGNOSIS — I25811 Atherosclerosis of native coronary artery of transplanted heart without angina pectoris: Secondary | ICD-10-CM | POA: Diagnosis not present

## 2017-07-01 LAB — BASIC METABOLIC PANEL
Anion gap: 8 (ref 5–15)
BUN: 19 mg/dL (ref 6–20)
CALCIUM: 8.4 mg/dL — AB (ref 8.9–10.3)
CO2: 28 mmol/L (ref 22–32)
CREATININE: 1.17 mg/dL — AB (ref 0.44–1.00)
Chloride: 98 mmol/L — ABNORMAL LOW (ref 101–111)
GFR calc non Af Amer: 38 mL/min — ABNORMAL LOW (ref >60)
GFR, EST AFRICAN AMERICAN: 44 mL/min — AB (ref >60)
Glucose, Bld: 97 mg/dL (ref 65–99)
Potassium: 4.2 mmol/L (ref 3.5–5.1)
SODIUM: 134 mmol/L — AB (ref 135–145)

## 2017-07-01 LAB — CBC
HCT: 37.6 % (ref 36.0–46.0)
Hemoglobin: 12.2 g/dL (ref 12.0–15.0)
MCH: 26.7 pg (ref 26.0–34.0)
MCHC: 32.4 g/dL (ref 30.0–36.0)
MCV: 82.3 fL (ref 78.0–100.0)
PLATELETS: 202 10*3/uL (ref 150–400)
RBC: 4.57 MIL/uL (ref 3.87–5.11)
RDW: 15.8 % — AB (ref 11.5–15.5)
WBC: 8.1 10*3/uL (ref 4.0–10.5)

## 2017-07-01 LAB — TROPONIN I
TROPONIN I: 0.04 ng/mL — AB (ref <0.03)
TROPONIN I: 0.04 ng/mL — AB (ref <0.03)

## 2017-07-01 LAB — LIPID PANEL
CHOL/HDL RATIO: 3 ratio
CHOLESTEROL: 181 mg/dL (ref 0–200)
HDL: 61 mg/dL (ref >40)
LDL Cholesterol: 103 mg/dL — ABNORMAL HIGH (ref 0–99)
Triglycerides: 87 mg/dL (ref <150)
VLDL: 17 mg/dL (ref 0–40)

## 2017-07-01 LAB — HEPARIN LEVEL (UNFRACTIONATED): HEPARIN UNFRACTIONATED: 0.47 [IU]/mL (ref 0.30–0.70)

## 2017-07-01 MED ORDER — ISOSORBIDE MONONITRATE ER 30 MG PO TB24
15.0000 mg | ORAL_TABLET | Freq: Every day | ORAL | Status: DC
  Administered 2017-07-01 – 2017-07-03 (×3): 15 mg via ORAL
  Filled 2017-07-01 (×3): qty 1

## 2017-07-01 MED ORDER — MAGNESIUM OXIDE 400 (241.3 MG) MG PO TABS
400.0000 mg | ORAL_TABLET | Freq: Two times a day (BID) | ORAL | Status: DC
  Administered 2017-07-01 – 2017-07-03 (×5): 400 mg via ORAL
  Filled 2017-07-01 (×5): qty 1

## 2017-07-01 NOTE — Progress Notes (Signed)
Progress Note  Patient Name: Lori [name] Date of Encounter: 07/01/2017  Primary Cardiologist: Dr. Percival Spanish  Subjective   Pt continues to report chest heaviness. Give imdur this morning and D/C nitro.  Inpatient Medications    Scheduled Meds: . aspirin  324 mg Oral Once  . aspirin EC  81 mg Oral Daily  . atorvastatin  10 mg Oral q1800  . carvedilol  3.125 mg Oral BID WC  . clopidogrel  75 mg Oral Daily  . isosorbide mononitrate  15 mg Oral Daily  . levothyroxine  50 mcg Oral QAC breakfast   Continuous Infusions: . heparin 800 Units/hr (07/01/17 0348)  . nitroGLYCERIN 5 mcg/min (07/01/17 0348)   PRN Meds: acetaminophen, nitroGLYCERIN, ondansetron (ZOFRAN) IV   Vital Signs    Vitals:   06/30/17 1655 06/30/17 1839 06/30/17 2023 07/01/17 0500  BP: (!) 165/71 (!) 147/89 (!) 142/64 (!) 159/73  Pulse: 87     Resp: 17     Temp: 97.7 F (36.5 C)  97.7 F (36.5 C) 97.7 F (36.5 C)  TempSrc: Oral  Oral Oral  SpO2: 100% 94% 95% 96%  Weight: 119 lb (54 kg)   123 lb 1.6 oz (55.8 kg)  Height: 5\' 4"  (1.626 m)       Intake/Output Summary (Last 24 hours) at 07/01/17 0921 Last data filed at 07/01/17 0348  Gross per 24 hour  Intake           114.44 ml  Output              700 ml  Net          -585.56 ml   Filed Weights   06/30/17 0729 06/30/17 1655 07/01/17 0500  Weight: 120 lb (54.4 kg) 119 lb (54 kg) 123 lb 1.6 oz (55.8 kg)     Physical Exam   General: Well developed, well nourished, female appearing in no acute distress. Head: Normocephalic, atraumatic.  Neck: Supple without bruits, no JVD. Lungs:  Resp regular and unlabored, CTA. Heart: RRR, S1, S2, no murmur; no rub. Abdomen: Soft, non-tender, non-distended with normoactive bowel sounds. No hepatomegaly. No rebound/guarding. No obvious abdominal masses. Extremities: No clubbing, cyanosis, no edema. Distal pedal pulses are 2+ bilaterally. Neuro: Alert and oriented X 3. Moves all extremities  spontaneously. Psych: Normal affect.  Labs    Chemistry Recent Labs Lab 06/30/17 0714 07/01/17 0522  NA 135 134*  K 4.5 4.2  CL 101 98*  CO2 24 28  GLUCOSE 107* 97  BUN 19 19  CREATININE 1.12* 1.17*  CALCIUM 8.6* 8.4*  PROT 6.0*  --   ALBUMIN 3.5  --   AST 41  --   ALT 24  --   ALKPHOS 65  --   BILITOT 0.8  --   GFRNONAA 40* 38*  GFRAA 47* 44*  ANIONGAP 10 8     Hematology Recent Labs Lab 06/30/17 0714 07/01/17 0522  WBC 11.6* 8.1  RBC 4.82 4.57  HGB 12.5 12.2  HCT 39.8 37.6  MCV 82.6 82.3  MCH 25.9* 26.7  MCHC 31.4 32.4  RDW 15.4 15.8*  PLT 204 202    Cardiac Enzymes Recent Labs Lab 06/30/17 0714 06/30/17 1830 06/30/17 2241 07/01/17 0522  TROPONINI <0.03 0.05* 0.04* 0.04*    Recent Labs Lab 06/30/17 0757  TROPIPOC 0.01     BNP Recent Labs Lab 06/30/17 0714  BNP 665.4*     DDimer No results for input(s): DDIMER in the last 168 hours.  Radiology    Dg Chest Portable 1 View  Result Date: 06/30/2017 CLINICAL DATA:  Short of breath, wheezing EXAM: PORTABLE CHEST 1 VIEW COMPARISON:  10/25/2016 FINDINGS: Normal cardiac silhouette. Small bilateral pleural effusions. Chronic bronchitic markings and hyperinflated lungs. Skin fold noted along the LEFT lateral chest wall. Linear peripheral interstitial markings. IMPRESSION: Interstitial edema with bilateral small effusions. Electronically Signed   By: Suzy Bouchard M.D.   On: 06/30/2017 08:09     Telemetry    Sinus with frequent PVCs , TWI - Personally Reviewed  ECG    No new tracings - Personally Reviewed   Cardiac Studies   Echocardiogram 03/12/12: Study Conclusions - Left ventricle: The cavity size was normal. Wall thickness was increased in a pattern of mild LVH. There appears to be severe hypokinesis of the apex and the apical anterior/septal/lateral/inferior segments. Systolic function was moderately reduced. The estimated ejection fraction was in the range of 35%  to 40%. Doppler parameters are consistent with abnormal left ventricular relaxation (grade 1 diastolic dysfunction). - Aortic valve: There was no stenosis. Trivial regurgitation. - Mitral valve: Mild regurgitation. - Left atrium: The atrium was mildly to moderately dilated. - Right ventricle: The cavity size was normal. Systolic function was normal. - Tricuspid valve: Peak RV-RA gradient: 82mm Hg (S). - Pulmonary arteries: PA peak pressure: 56mm Hg (S). - Systemic veins: IVC measured 2.3 cm with some respirophasic variation, suggesting RA pressure 10 mmHg.  Impressions: - Normal LV size with mild LV hypertrophy. EF 35-40% with peri-apical hypokinesis. Normal RV size and systolic function. Mild pulmonary hypertension.  Patient Profile     83 y.o. female with a hx of CAD s/p NSTEMI x 2 with PCI and DES to distal Cx and OM2 (2011), NSTEMI in 2013 treated conservatively, HTN, HLD, hx of chronic systolic heart failure, carotid artery stenosis s/p LCEA (2002) who is being seen for chest pain and EKG changes. Opting to treat conservatively. On heparin drip.  Assessment & Plan    1. CAD, hx of NSTEMI 2011 s/p PCI/DES to distal Cx, s/p PCI/DES to OM2; NSTEMI 2013 treated conservatively - troponins trended: 0.05 --> 0.04 --> 0.04 - pt was initially placed on heparin drip and nitro drip - continue heparin drip x 48 hr (total) - nitro still running - imdur was initiated yesterday - she continues to complain of chest heaviness - will D/C nitro and titrate imdur - continue ASA and plavix   2. Frequent PVCs - ordered PO magox (shortage of IV Mg)   3. HTN - continue coreg and imdur   Signed, Ledora Bottcher , PA-C 9:21 AM 07/01/2017 Pager: [phone]   Attending Note:   The patient was seen and examined.  Agree with assessment and plan as noted above.  Changes made to the above note as needed.  Patient seen and independently examined with Doreene Adas, PA .    We discussed all aspects of the encounter. I agree with the assessment and plan as stated above.  1. Unstable angina: Mrs. Goga it seems to be doing lots better. We will continue the heparin drip. Her troponin levels have not increased much. As we discussed yesterday, would prefer medical management. At her age of 72 years she is at increased risk for complications  if we choose an invasive/interventional strategy. My hope is that we can get her home on medical therapy.  We'll continue heparin for one more day and then discontinue it.    I have spent a total  of 40 minutes with patient reviewing hospital  notes , telemetry, EKGs, labs and examining patient as well as establishing an assessment and plan that was discussed with the patient. > 50% of time was spent in direct patient care.    Thayer Headings, Brooke Bonito., MD, [facility] 07/01/2017, 11:56 AM [address]  [place] Pager [phone]

## 2017-07-01 NOTE — Progress Notes (Signed)
ANTICOAGULATION CONSULT NOTE  Pharmacy Consult for heparin Indication: ACS  No Known Allergies  Patient Measurements: Height: 5\' 4"  (162.6 cm) Weight: 123 lb 1.6 oz (55.8 kg) IBW/kg (Calculated) : 54.7 Heparin Dosing Weight: 54.4 kg  Vital Signs: Temp: 97.7 F (36.5 C) (10/11 0500) Temp Source: Oral (10/11 0500) BP: 159/73 (10/11 0500)  Labs:  Recent Labs  06/30/17 0714 06/30/17 1830 06/30/17 2241 07/01/17 0522  HGB 12.5  --   --  12.2  HCT 39.8  --   --  37.6  PLT 204  --   --  202  HEPARINUNFRC  --  0.27*  --  0.47  CREATININE 1.12*  --   --  1.17*  TROPONINI <0.03 0.05* 0.04* 0.04*    Estimated Creatinine Clearance: 24.3 mL/min (A) (by C-G formula based on SCr of 1.17 mg/dL (H)).   Medical History: Past Medical History:  Diagnosis Date  . Arthritis   . CAD (coronary artery disease)    a. NSTEMI in 2011 s/p PCI/DES to distal Cx, s/p PCI/DES to OM2 (EF 40%). b. NSTEMI 02/2012 treated conservatively.  . Carotid artery stenosis    Left carotid endarterectomy 02/2001  . CHF (congestive heart failure) ([place])    a. EF 40% in 2011. b. EF 35-40% by echo 03/12/12.  [name] Kitchen Hyperlipidemia   . Hypertension   . Hyponatremia    Noted on 02/2012 admission  . Pneumonia    02/2012  . Renal insufficiency    Noted on 02/2012 admission  . Respiratory failure ([place])    02/2012 - requiring bipap - multifactorial in setting of PNA/CHF/NSTEMI  . Thyroid disease    Euthyroid 02/2012    Assessment: 83 yo female with hx of CAD and prior DES. Presents to ED today with chest pain over the past week. SCr 1.1, CBC wnl. Appears that we will plan to treat the patient conservatively for now unless she has persistent angina or EKG changes.   Goal of Therapy:  Heparin level 0.3-0.7 units/ml Monitor platelets by anticoagulation protocol: Yes    Plan:  -Continue heparin at 800 units/hr -Daily HL, CBC -Watch duration of heparin   Harvel Quale 07/01/2017,8:14 AM

## 2017-07-01 NOTE — Plan of Care (Signed)
Problem: Skin Integrity: Goal: Risk for impaired skin integrity will decrease Outcome: Progressing Sacral pad placed to protect skin integrity. Patient educated on importance of frequent repositioning to protect skin integrity

## 2017-07-02 DIAGNOSIS — L8990 Pressure ulcer of unspecified site, unspecified stage: Secondary | ICD-10-CM | POA: Insufficient documentation

## 2017-07-02 DIAGNOSIS — R079 Chest pain, unspecified: Secondary | ICD-10-CM

## 2017-07-02 LAB — CBC
HCT: 36.9 % (ref 36.0–46.0)
HEMOGLOBIN: 11.7 g/dL — AB (ref 12.0–15.0)
MCH: 25.8 pg — ABNORMAL LOW (ref 26.0–34.0)
MCHC: 31.7 g/dL (ref 30.0–36.0)
MCV: 81.5 fL (ref 78.0–100.0)
PLATELETS: 189 10*3/uL (ref 150–400)
RBC: 4.53 MIL/uL (ref 3.87–5.11)
RDW: 15.3 % (ref 11.5–15.5)
WBC: 6.9 10*3/uL (ref 4.0–10.5)

## 2017-07-02 LAB — TROPONIN I

## 2017-07-02 LAB — HEPARIN LEVEL (UNFRACTIONATED): HEPARIN UNFRACTIONATED: 0.43 [IU]/mL (ref 0.30–0.70)

## 2017-07-02 MED ORDER — HEPARIN (PORCINE) IN NACL 100-0.45 UNIT/ML-% IJ SOLN
900.0000 [IU]/h | INTRAMUSCULAR | Status: DC
  Administered 2017-07-02: 800 [IU]/h via INTRAVENOUS
  Filled 2017-07-02: qty 250

## 2017-07-02 MED ORDER — DOCUSATE SODIUM 100 MG PO CAPS
100.0000 mg | ORAL_CAPSULE | Freq: Two times a day (BID) | ORAL | Status: DC | PRN
  Administered 2017-07-02: 100 mg via ORAL
  Filled 2017-07-02: qty 1

## 2017-07-02 MED ORDER — BISACODYL 5 MG PO TBEC: 5.0000 mg | DELAYED_RELEASE_TABLET | Freq: Every day | ORAL | Status: DC | PRN

## 2017-07-02 MED ORDER — ATORVASTATIN CALCIUM 10 MG PO TABS: 10.0000 mg | ORAL_TABLET | Freq: Every day | ORAL | 6 refills | Status: DC

## 2017-07-02 MED ORDER — CARVEDILOL 3.125 MG PO TABS: 3.1250 mg | ORAL_TABLET | Freq: Two times a day (BID) | ORAL | 0 refills | Status: DC

## 2017-07-02 MED ORDER — NITROGLYCERIN 0.4 MG SL SUBL: 0.4000 mg | SUBLINGUAL_TABLET | SUBLINGUAL | 4 refills | Status: DC | PRN

## 2017-07-02 NOTE — Progress Notes (Signed)
Progress Note  Patient Name: Lori [name] Date of Encounter: 07/02/2017  Primary Cardiologist: Dr. Vita Barley   Subjective   No chest pain.  Does not feel PVCs.  No SOB.  Feels well.   Inpatient Medications    Scheduled Meds: . aspirin  324 mg Oral Once  . aspirin EC  81 mg Oral Daily  . atorvastatin  10 mg Oral q1800  . carvedilol  3.125 mg Oral BID WC  . clopidogrel  75 mg Oral Daily  . isosorbide mononitrate  15 mg Oral Daily  . levothyroxine  50 mcg Oral QAC breakfast  . magnesium oxide  400 mg Oral BID   Continuous Infusions: . heparin 800 Units/hr (07/01/17 1653)   PRN Meds: acetaminophen, nitroGLYCERIN, ondansetron (ZOFRAN) IV   Vital Signs    Vitals:   07/01/17 1337 07/01/17 1654 07/01/17 2115 07/02/17 0351  BP: (!) 116/49 (!) 147/72 134/70 (!) 162/77  Pulse:  87 77 84  Resp:   18   Temp: 97.8 F (36.6 C)  97.7 F (36.5 C) 97.7 F (36.5 C)  TempSrc:   Oral Oral  SpO2:   95% 97%  Weight:    120 lb 4.8 oz (54.6 kg)  Height:        Intake/Output Summary (Last 24 hours) at 07/02/17 0725 Last data filed at 07/02/17 0710  Gross per 24 hour  Intake            218.9 ml  Output                3 ml  Net            215.9 ml   Filed Weights   06/30/17 1655 07/01/17 0500 07/02/17 0351  Weight: 119 lb (54 kg) 123 lb 1.6 oz (55.8 kg) 120 lb 4.8 oz (54.6 kg)    Telemetry    SR with freq PVCs. Pt not aware of irregular HR.  - Personally Reviewed  ECG    EKG yesterday with deeper T wave inversions laterally SR with PVCs. - Personally Reviewed  Physical Exam   GEN: No acute distress.   Neck: No JVD Cardiac: RRR, no murmurs, rubs, or gallops.  Respiratory: Clear to auscultation bilaterally. GI: Soft, nontender, non-distended  MS: No edema; No deformity. Neuro:  Nonfocal  Psych: Normal affect   Labs    Chemistry Recent Labs Lab 06/30/17 0714 07/01/17 0522  NA 135 134*  K 4.5 4.2  CL 101 98*  CO2 24 28  GLUCOSE 107* 97  BUN 19 19    CREATININE 1.12* 1.17*  CALCIUM 8.6* 8.4*  PROT 6.0*  --   ALBUMIN 3.5  --   AST 41  --   ALT 24  --   ALKPHOS 65  --   BILITOT 0.8  --   GFRNONAA 40* 38*  GFRAA 47* 44*  ANIONGAP 10 8     Hematology Recent Labs Lab 06/30/17 0714 07/01/17 0522 07/02/17 0519  WBC 11.6* 8.1 6.9  RBC 4.82 4.57 4.53  HGB 12.5 12.2 11.7*  HCT 39.8 37.6 36.9  MCV 82.6 82.3 81.5  MCH 25.9* 26.7 25.8*  MCHC 31.4 32.4 31.7  RDW 15.4 15.8* 15.3  PLT 204 202 189    Cardiac Enzymes Recent Labs Lab 06/30/17 0714 06/30/17 1830 06/30/17 2241 07/01/17 0522  TROPONINI <0.03 0.05* 0.04* 0.04*    Recent Labs Lab 06/30/17 0757  TROPIPOC 0.01     BNP Recent Labs Lab 06/30/17 7062  BNP 665.4*     DDimer No results for input(s): DDIMER in the last 168 hours.   Radiology    Dg Chest Portable 1 View  Result Date: 06/30/2017 CLINICAL DATA:  Short of breath, wheezing EXAM: PORTABLE CHEST 1 VIEW COMPARISON:  10/25/2016 FINDINGS: Normal cardiac silhouette. Small bilateral pleural effusions. Chronic bronchitic markings and hyperinflated lungs. Skin fold noted along the LEFT lateral chest wall. Linear peripheral interstitial markings. IMPRESSION: Interstitial edema with bilateral small effusions. Electronically Signed   By: Suzy Bouchard M.D.   On: 06/30/2017 08:09    Cardiac Studies   03/12/12 ECHO Study Conclusions  - Left ventricle: The cavity size was normal. Wall thickness was increased in a pattern of mild LVH. There appears to be severe hypokinesis of the apex and the apical anterior/septal/lateral/inferior segments. Systolic function was moderately reduced. The estimated ejection fraction was in the range of 35% to 40%. Doppler parameters are consistent with abnormal left ventricular relaxation (grade 1 diastolic dysfunction). - Aortic valve: There was no stenosis. Trivial regurgitation. - Mitral valve: Mild regurgitation. - Left atrium: The atrium was  mildly to moderately dilated. - Right ventricle: The cavity size was normal. Systolic function was normal. - Tricuspid valve: Peak RV-RA gradient: 34mm Hg (S). - Pulmonary arteries: PA peak pressure: 8mm Hg (S). - Systemic veins: IVC measured 2.3 cm with some respirophasic variation, suggesting RA pressure 10 mmHg. Impressions:  - Normal LV size with mild LV hypertrophy. EF 35-40% with peri-apical hypokinesis. Normal RV size and systolic function. Mild pulmonary hypertension.  Patient Profile     83 y.o. female with a hx of CAD s/p NSTEMI x 2 with PCI and DES to distal Cx and OM2 (2011), NSTEMI in 2013 treated conservatively, HTN, HLD, hx of chronic systolic heart failure, carotid artery stenosis s/p LCEA (2002) who is being seen for chest pain and EKG changes. Opting to treat conservatively. On heparin drip.  Assessment & Plan    [place] -troponin 0.05, 0.04 and 0.04, NTG drip off and on imdur, d/c heparin after 48 hours.  On ASA and Plavix  Ambulate, possible d/c  CAD, hx of NSTEMI 2011 with DES to dLCX.  Des to [place], NSTEMI in 2013 treated conservatively now [place].  PVCs Freq -po mag ordered. Level 1.8   HTN, essential, BP 162/77 156/68 on coreg and imdur -increase imdur to 30   HLD on lipitor at 10   Combined systolic and diastolic HF, BNP  416, CXR with edema, one dose IV lasix on admit  Neg 869 since admit.  Wt at 120 lbs  EF 35-40% in 2013 with G1DD. PA pk pressure 37 mmHg -mild pulmonary HTN.  ICM - monitor for HF   For questions or updates, please contact [place] Please consult [email or web address] for contact info under Cardiology/STEMI.      Signed, Cecilie Kicks, NP  07/02/2017, 7:25 AM    Attending Note:   The patient was seen and examined.  Agree with assessment and plan as noted above.  Changes made to the above note as needed.  Patient seen and independently examined with  Cecilie Kicks, NP.   We discussed all aspects of the encounter. I agree with the  assessment and plan as stated above.  1  Chest pain :  Enzymes are unremarkable.  Flat trend.  On Imdur.  Off heparin drip   Plan is to DC to home later today ( or perhaps tomorrow )  if she is stable.  I have spent a total of 40 minutes with patient reviewing hospital  notes , telemetry, EKGs, labs and examining patient as well as establishing an assessment and plan that was discussed with the patient. > 50% of time was spent in direct patient care.    Thayer Headings, Brooke Bonito., MD, [facility] 07/02/2017, 11:24 AM [address]  [place] Pager [phone]

## 2017-07-02 NOTE — Progress Notes (Signed)
Pt called out with complaints of chest pain. She stated that she didn't feel like it was a "10" but couldn't rate it. She said it "just hurts right here" while holding her palm over her sternum. Bp 134 61 SR with PVc at 77. EKG done. 1 SL NTG given with relief of chest pain. Blood pressure dropped to 98/59 NSR 70s. Pt instructed to call for any further pain

## 2017-07-02 NOTE — Discharge Summary (Signed)
Discharge Summary    Patient ID: Lori [name],  MRN: [id number], DOB/AGE: [birth year] 81 y.o.  Admit date: 06/30/2017 Discharge date: 07/03/2017  Primary Care Provider: Eustaquio [name] Primary Cardiologist: Dr. Vita [name]   Discharge Diagnoses    Principal Problem:   Unstable angina [facility]) Active Problems:   Accelerated hypertension   CORONARY ATHEROSCLEROSIS NATIVE CORONARY ARTERY   Dyslipidemia   Chronic systolic heart failure (HCC)   Ischemic cardiomyopathy   CKD (chronic kidney disease)   Pressure injury of skin  Allergies No Known Allergies  Diagnostic Studies/Procedures    None Performed this admission.    History of Present Illness     Ms. Lori [name] is a 83 y.o. female with past medical history of CAD (s/p NSTEMI x 2 with PCI and DES to distal Cx and OM2 in 2011, NSTEMI in 2013 treated conservatively), HTN, HLD, hx of chronic systolic heart failure (EF 35-40% by echo in 02/2012), and carotid artery stenosis s/p LCEA in 2002 who presented to [facility] on 06/30/2017 for evaluation of chest pain.   She woke the morning of admission at 0500 to use the bathroom. She began to have substernal chest pain that felt like a pressure. The pain was worse than usual and didn't go away with rest. She was very diaphoretic, but denied shortness of breath, palpitations, dizziness, and syncope.  She called her daughter who then called EMS. She vomited one time with EMS on the way to the ED. She received nitro which relieved her pain. She stated that she normally has chest pain on the way to the bathroom each morning, but the pain is manageable and goes away with rest.   Initial troponin was negative. EKG showed ST elevation in V1-3 and depression in V5/V6. BNP was 665. CXR negative for infectious process, but with interstitial edema and bilateral small pleural effusion.  She was admitted to rule out ACS and treat her HF.     Hospital Course     Consultants: none  Pt was  admitted and placed on IV NTG and IV heparin. She had been given 40 mg IV Lasix in ER and diuresed well. Troponin values remained flat at 0.05, 0.04, and 0.04. Due to her age, medical management was agreed upon between Dr. Acie Fredrickson, the patient, and her family.  Also the pt's daughter.   She was continued on Heparin for a full 48 hours. Her pain improved and  IV NTG and this was changed to Imdur. She was initially to be discharged on 07/02/2017 but developed recurrent chest pain. EKG showed no acute changes and improved with 1 SL NTG. She was restarted on Heparin and observed for another evening.   On 07/03/2017, she denied any repeat episodes of chest pain and reported feeling back to baseline. She was last examined by Dr. Radford [name] and deemed stable for discharge. She will continue on medical therapy with ASA, Plavix, statin, Coreg, and Imdur (dosing increased to 30mg  daily).   _____________  Discharge Vitals Blood pressure (!) 162/48, pulse 88, temperature 98.4 F (36.9 C), temperature source Oral, resp. rate 18, height 5\' 4"  (1.626 m), weight 121 lb 14.4 oz (55.3 kg), SpO2 100 %.  Filed Weights   07/01/17 0500 07/02/17 0351 07/03/17 0448  Weight: 123 lb 1.6 oz (55.8 kg) 120 lb 4.8 oz (54.6 kg) 121 lb 14.4 oz (55.3 kg)    Labs & Radiologic Studies    CBC  Recent Labs  07/02/17 0519 07/03/17 7616  WBC 6.9 7.9  HGB 11.7* 11.8*  HCT 36.9 37.6  MCV 81.5 81.4  PLT 189 518   Basic Metabolic Panel  Recent Labs  06/30/17 1830 07/01/17 0522  NA  --  134*  K  --  4.2  CL  --  98*  CO2  --  28  GLUCOSE  --  97  BUN  --  19  CREATININE  --  1.17*  CALCIUM  --  8.4*  MG 1.8  --    Liver Function Tests No results for input(s): AST, ALT, ALKPHOS, BILITOT, PROT, ALBUMIN in the last 72 hours. No results for input(s): LIPASE, AMYLASE in the last 72 hours. Cardiac Enzymes  Recent Labs  06/30/17 2241 07/01/17 0522 07/02/17 2104  TROPONINI 0.04* 0.04* <0.03   BNP Invalid input(s):  POCBNP D-Dimer No results for input(s): DDIMER in the last 72 hours. Hemoglobin A1C No results for input(s): HGBA1C in the last 72 hours. Fasting Lipid Panel  Recent Labs  07/01/17 0522  CHOL 181  HDL 61  LDLCALC 103*  TRIG 87  CHOLHDL 3.0   Thyroid Function Tests No results for input(s): TSH, T4TOTAL, T3FREE, THYROIDAB in the last 72 hours.  Invalid input(s): FREET3 _____________  Dg Chest Portable 1 View  Result Date: 06/30/2017 CLINICAL DATA:  Short of breath, wheezing EXAM: PORTABLE CHEST 1 VIEW COMPARISON:  10/25/2016 FINDINGS: Normal cardiac silhouette. Small bilateral pleural effusions. Chronic bronchitic markings and hyperinflated lungs. Skin fold noted along the LEFT lateral chest wall. Linear peripheral interstitial markings. IMPRESSION: Interstitial edema with bilateral small effusions. Electronically Signed   By: Suzy Bouchard M.D.   On: 06/30/2017 08:09   Disposition   Pt is being discharged home today in good condition.  Follow-up Plans & Appointments    Follow-up Information    Barrett, Lori Croon, PA-C Follow up on 07/19/2017.   Specialties:  Cardiology, Radiology Why:  Cardiology Follow-Up with Dr. Rosezella Florida PA on 07/19/2017 at 9:30AM.  Contact information: [address] [name] [address] [phone]          Discharge Instructions    Diet - low sodium heart healthy    Complete by:  As directed    Diet - low sodium heart healthy    Complete by:  As directed    Increase activity slowly    Complete by:  As directed    Increase activity slowly    Complete by:  As directed       Discharge Medications   Current Discharge Medication List    START taking these medications   Details  atorvastatin (LIPITOR) 10 MG tablet Take 1 tablet (10 mg total) by mouth daily at 6 PM. Qty: 30 tablet, Refills: 6    nitroGLYCERIN (NITROSTAT) 0.4 MG SL tablet Place 1 tablet (0.4 mg total) under the tongue every 5 (five) minutes as needed  for chest pain. Qty: 25 tablet, Refills: 3      CONTINUE these medications which have CHANGED   Details  carvedilol (COREG) 3.125 MG tablet Take 1 tablet (3.125 mg total) by mouth 2 (two) times daily with a meal. Qty: 180 tablet, Refills: 0    isosorbide mononitrate (IMDUR) 30 MG 24 hr tablet Take 1 tablet (30 mg total) by mouth daily. Qty: 90 tablet, Refills: 3      CONTINUE these medications which have NOT CHANGED   Details  aspirin 81 MG tablet Take 81 mg by mouth daily.      clopidogrel (PLAVIX)  75 MG tablet Take 1 tablet (75 mg total) by mouth daily. Qty: 90 tablet, Refills: 3    levothyroxine (SYNTHROID, LEVOTHROID) 50 MCG tablet Take 1 Tablet by mouth once daily before breakfast Qty: 30 tablet, Refills: 10    furosemide (LASIX) 40 MG tablet Take 1 tablet (40 mg total) by mouth daily. Qty: 90 tablet, Refills: 3    ketoconazole (NIZORAL) 2 % cream Apply topically daily behind ears when sore, as directed Qty: 15 g, Refills: 0   Associated Diagnoses: Seborrheic dermatitis    triamcinolone (KENALOG) 0.025 % ointment Apply 1 application topically 2 (two) times daily. Qty: 30 g, Refills: 0   Associated Diagnoses: Eczema, unspecified type      STOP taking these medications     famotidine (PEPCID) 20 MG tablet           Outstanding Labs/Studies   Repeat FLP and LFT's in 6-8 weeks.   Duration of Discharge Encounter   Greater than 30 minutes including physician time.  Signed, Erma Heritage NP 07/03/2017, 11:01 AM

## 2017-07-02 NOTE — Progress Notes (Signed)
ANTICOAGULATION CONSULT NOTE  Pharmacy Consult for heparin Indication: ACS  No Known Allergies  Patient Measurements: Height: 5\' 4"  (162.6 cm) Weight: 120 lb 4.8 oz (54.6 kg) IBW/kg (Calculated) : 54.7 Heparin Dosing Weight: 54.4 kg  Vital Signs: Temp: 97.7 F (36.5 C) (10/12 0351) Temp Source: Oral (10/12 0351) BP: 162/77 (10/12 0351) Pulse Rate: 84 (10/12 0351)  Labs:  Recent Labs  06/30/17 0714 06/30/17 1830 06/30/17 2241 07/01/17 0522 07/02/17 0519  HGB 12.5  --   --  12.2 11.7*  HCT 39.8  --   --  37.6 36.9  PLT 204  --   --  202 189  HEPARINUNFRC  --  0.27*  --  0.47 0.43  CREATININE 1.12*  --   --  1.17*  --   TROPONINI <0.03 0.05* 0.04* 0.04*  --     Estimated Creatinine Clearance: 24.2 mL/min (A) (by C-G formula based on SCr of 1.17 mg/dL (H)).   Medical History: Past Medical History:  Diagnosis Date  . Arthritis   . CAD (coronary artery disease)    a. NSTEMI in 2011 s/p PCI/DES to distal Cx, s/p PCI/DES to OM2 (EF 40%). b. NSTEMI 02/2012 treated conservatively.  . Carotid artery stenosis    Left carotid endarterectomy 02/2001  . CHF (congestive heart failure) ([place])    a. EF 40% in 2011. b. EF 35-40% by echo 03/12/12.  [name] Kitchen Hyperlipidemia   . Hypertension   . Hyponatremia    Noted on 02/2012 admission  . Pneumonia    02/2012  . Renal insufficiency    Noted on 02/2012 admission  . Respiratory failure ([place])    02/2012 - requiring bipap - multifactorial in setting of PNA/CHF/NSTEMI  . Thyroid disease    Euthyroid 02/2012    Assessment: 83 yo female with hx of CAD and prior DES. Presents to ED today with chest pain over the past week. SCr 1.1, CBC wnl. Appears that we will plan to treat the patient conservatively for now unless she has persistent angina or EKG changes. HL therapeutic, h/h, plts wnl.   Goal of Therapy:  Heparin level 0.3-0.7 units/ml Monitor platelets by anticoagulation protocol: Yes    Plan:  -Continue heparin at 800  units/hr -Daily HL, CBC   Jahki Witham, Jake Church 07/02/2017,7:51 AM

## 2017-07-03 DIAGNOSIS — I5022 Chronic systolic (congestive) heart failure: Secondary | ICD-10-CM

## 2017-07-03 DIAGNOSIS — I255 Ischemic cardiomyopathy: Secondary | ICD-10-CM

## 2017-07-03 DIAGNOSIS — I25811 Atherosclerosis of native coronary artery of transplanted heart without angina pectoris: Secondary | ICD-10-CM

## 2017-07-03 DIAGNOSIS — E785 Hyperlipidemia, unspecified: Secondary | ICD-10-CM

## 2017-07-03 LAB — CBC
HCT: 37.6 % (ref 36.0–46.0)
HEMOGLOBIN: 11.8 g/dL — AB (ref 12.0–15.0)
MCH: 25.5 pg — AB (ref 26.0–34.0)
MCHC: 31.4 g/dL (ref 30.0–36.0)
MCV: 81.4 fL (ref 78.0–100.0)
Platelets: 159 10*3/uL (ref 150–400)
RBC: 4.62 MIL/uL (ref 3.87–5.11)
RDW: 15.7 % — ABNORMAL HIGH (ref 11.5–15.5)
WBC: 7.9 10*3/uL (ref 4.0–10.5)

## 2017-07-03 LAB — HEPARIN LEVEL (UNFRACTIONATED): HEPARIN UNFRACTIONATED: 0.26 [IU]/mL — AB (ref 0.30–0.70)

## 2017-07-03 MED ORDER — NITROGLYCERIN 0.4 MG SL SUBL: 0.4000 mg | SUBLINGUAL_TABLET | SUBLINGUAL | 3 refills | Status: DC | PRN

## 2017-07-03 MED ORDER — ISOSORBIDE MONONITRATE ER 30 MG PO TB24: 30.0000 mg | ORAL_TABLET | Freq: Every day | ORAL | 3 refills | Status: DC

## 2017-07-03 MED ORDER — ISOSORBIDE MONONITRATE ER 30 MG PO TB24: 30.0000 mg | ORAL_TABLET | Freq: Every day | ORAL | Status: DC

## 2017-07-03 NOTE — Progress Notes (Signed)
Progress Note  Patient Name: Lori [name] Date of Encounter: 07/03/2017  Primary Cardiologist: Dr. Percival Spanish  Subjective   Had an episode of CP yesterday and discharge was placed on hold.  She says that the pain yesterday was very mild and she has not had anymore. She had a large BM this am and says she feels great  Inpatient Medications    Scheduled Meds: . aspirin  324 mg Oral Once  . aspirin EC  81 mg Oral Daily  . atorvastatin  10 mg Oral q1800  . carvedilol  3.125 mg Oral BID WC  . clopidogrel  75 mg Oral Daily  . isosorbide mononitrate  15 mg Oral Daily  . levothyroxine  50 mcg Oral QAC breakfast  . magnesium oxide  400 mg Oral BID   Continuous Infusions: . heparin 900 Units/hr (07/03/17 0857)   PRN Meds: acetaminophen, bisacodyl, docusate sodium, nitroGLYCERIN, ondansetron (ZOFRAN) IV   Vital Signs    Vitals:   07/02/17 1702 07/02/17 2011 07/03/17 0448 07/03/17 0857  BP: (!) 149/69 (!) 151/54 (!) 184/72 (!) 162/48  Pulse: 78 71 82 88  Resp:  15 18 18   Temp:  97.9 F (36.6 C) 98.4 F (36.9 C)   TempSrc:  Oral Oral   SpO2:  99% 100%   Weight:   121 lb 14.4 oz (55.3 kg)   Height:        Intake/Output Summary (Last 24 hours) at 07/03/17 0938 Last data filed at 07/03/17 0700  Gross per 24 hour  Intake             1360 ml  Output              900 ml  Net              460 ml   Filed Weights   07/01/17 0500 07/02/17 0351 07/03/17 0448  Weight: 123 lb 1.6 oz (55.8 kg) 120 lb 4.8 oz (54.6 kg) 121 lb 14.4 oz (55.3 kg)    Telemetry    NSR with PVCs - Personally Reviewed  ECG    No new EKG to review - Personally Reviewed  Physical Exam   GEN: No acute distress.   Neck: No JVD Cardiac: RRR, no murmurs, rubs, or gallops.  Respiratory: Clear to auscultation bilaterally. GI: Soft, nontender, non-distended  MS: No edema; No deformity. Neuro:  Nonfocal  Psych: Normal affect   Labs    Chemistry Recent Labs Lab 06/30/17 0714 07/01/17 0522    NA 135 134*  K 4.5 4.2  CL 101 98*  CO2 24 28  GLUCOSE 107* 97  BUN 19 19  CREATININE 1.12* 1.17*  CALCIUM 8.6* 8.4*  PROT 6.0*  --   ALBUMIN 3.5  --   AST 41  --   ALT 24  --   ALKPHOS 65  --   BILITOT 0.8  --   GFRNONAA 40* 38*  GFRAA 47* 44*  ANIONGAP 10 8     Hematology Recent Labs Lab 07/01/17 0522 07/02/17 0519 07/03/17 0616  WBC 8.1 6.9 7.9  RBC 4.57 4.53 4.62  HGB 12.2 11.7* 11.8*  HCT 37.6 36.9 37.6  MCV 82.3 81.5 81.4  MCH 26.7 25.8* 25.5*  MCHC 32.4 31.7 31.4  RDW 15.8* 15.3 15.7*  PLT 202 189 159    Cardiac Enzymes Recent Labs Lab 06/30/17 1830 06/30/17 2241 07/01/17 0522 07/02/17 2104  TROPONINI 0.05* 0.04* 0.04* <0.03    Recent Labs Lab 06/30/17 0757  TROPIPOC 0.01     BNP Recent Labs Lab 06/30/17 0714  BNP 665.4*     DDimer No results for input(s): DDIMER in the last 168 hours.   Radiology    No results found.  Cardiac Studies   none  Patient Profile     83 y.o. female with a hx of CAD s/p NSTEMI x 2 with PCI and DES to distal Cx and OM2 (2011), NSTEMI in 2013 treated conservatively, HTN, HLD, hx of chronic systolic heart failure, carotid artery stenosis s/p LCEA (2002) who is being seen for chest pain and EKG changes. Opting to treat conservatively.   Assessment & Plan    [place] -troponin 0.05, 0.04 and 0.04, NTG drip off and on imdur. - had a brief episode of CP yesterday that she says was mild and since having a large BM feels great.  Repeat trop was < 0.03. - continue medical management with ASA, Plavix, statin, carvedilol and long acting nitrate. - Will increase Imdur to 30mg  daily - stable for discharge home today  CAD, hx of NSTEMI 2011 with DES to dLCX.  Des to [place], NSTEMI in 2013 treated conservatively  - continue ASA/Plavix and nitrates.   PVCs Freq - mag level 1.8 - repleted  HTN - continue on Imdur and BB  HLD on lipitor at 10   Combined systolic and diastolic HF - one dose IV lasix on admit  Neg  409 since admit.  Wt at 120 -121 lbs  - EF 35-40% in 2013 with G1DD. PA pk pressure 37 mmHg -mild pulmonary HTN.  ICM - monitor for HF   Patient has not had any further CP and ? Whether she had consitpation causing her CP yesterday as she feels much better after a BM.  Will increase imdur to 30mg  daily and d/c home with early followup in the office.  For questions or updates, please contact [place] Please consult [email or web address] for contact info under Cardiology/STEMI.      Signed, Fransico Him, MD  07/03/2017, 9:38 AM

## 2017-07-03 NOTE — Plan of Care (Signed)
Problem: Safety: Goal: Ability to remain free from injury will improve Outcome: Progressing Fall risk bundle in place. No falls, skin breakdown or other injuries this shift.   Problem: Pain Managment: Goal: General experience of comfort will improve Outcome: Progressing Pt denied cp or other pain this shift.

## 2017-07-03 NOTE — Discharge Instructions (Signed)
Aspirin and Your Heart  Aspirin is a medicine that affects the way blood clots. Aspirin can be used to help reduce the risk of blood clots, heart attacks, and other heart-related problems.  Should I take aspirin?  Your health care provider will help you determine whether it is safe and beneficial for you to take aspirin daily. Taking aspirin daily may be beneficial if you:   Have had a heart attack or chest pain.   Have undergone open heart surgery such as coronary artery bypass surgery (CABG).   Have had coronary angioplasty.   Have experienced a stroke or transient ischemic attack (TIA).   Have peripheral vascular disease (PVD).   Have chronic heart rhythm problems such as atrial fibrillation.    Are there any risks of taking aspirin daily?  Daily use of aspirin can increase your risk of side effects. Some of these include:   Bleeding. Bleeding problems can be minor or serious. An example of a minor problem is a cut that does not stop bleeding. An example of a more serious problem is stomach bleeding or bleeding into the brain. Your risk of bleeding is increased if you are also taking non-steroidal anti-inflammatory medicine (NSAIDs).   Increased bruising.   Upset stomach.   An allergic reaction. People who have nasal polyps have an increased risk of developing an aspirin allergy.    What are some guidelines I should follow when taking aspirin?   Take aspirin only as directed by your health care provider. Make sure you understand how much you should take and what form you should take. The two forms of aspirin are:  ? Non-enteric-coated. This type of aspirin does not have a coating and is absorbed quickly. Non-enteric-coated aspirin is usually recommended for people with chest pain. This type of aspirin also comes in a chewable form.  ? Enteric-coated. This type of aspirin has a special coating that releases the medicine very slowly. Enteric-coated aspirin causes less stomach upset than  non-enteric-coated aspirin. This type of aspirin should not be chewed or crushed.   Drink alcohol in moderation. Drinking alcohol increases your risk of bleeding.  When should I seek medical care?   You have unusual bleeding or bruising.   You have stomach pain.   You have an allergic reaction. Symptoms of an allergic reaction include:  ? Hives.  ? Itchy skin.  ? Swelling of the lips, tongue, or face.   You have ringing in your ears.  When should I seek immediate medical care?   Your bowel movements are bloody, dark red, or black in color.   You vomit or cough up blood.   You have blood in your urine.   You cough, wheeze, or feel short of breath.  If you have any of the following symptoms, this is an emergency. Do not wait to see if the pain will go away. Get medical help at once. Call your local emergency services (911 in the [place] Do not drive yourself to the hospital.   You have severe chest pain, especially if the pain is crushing or pressure-like and spreads to the arms, back, neck, or jaw.   You have stroke-like symptoms, such as:  ? Loss of vision.  ? Difficulty talking.  ? Numbness or weakness on one side of your body.  ? Numbness or weakness in your arm or leg.  ? Not thinking clearly or feeling confused.    This information is not intended to replace advice given to you   by your health care provider. Make sure you discuss any questions you have with your health care provider.  Document Released: 08/20/2008 Document Revised: 01/15/2016 Document Reviewed: 12/13/2013  Elsevier Interactive Patient Education  2018 [employer]

## 2017-07-03 NOTE — Progress Notes (Signed)
ANTICOAGULATION CONSULT NOTE - Follow Up Consult  Pharmacy Consult for heparin Indication: chest pain/ACS  No Known Allergies  Patient Measurements: Height: 5\' 4"  (162.6 cm) Weight: 121 lb 14.4 oz (55.3 kg) IBW/kg (Calculated) : 54.7 Heparin Dosing Weight: 54  Vital Signs: Temp: 98.4 F (36.9 C) (10/13 0448) Temp Source: Oral (10/13 0448) BP: 184/72 (10/13 0448) Pulse Rate: 82 (10/13 0448)  Labs:  Recent Labs  06/30/17 2241  07/01/17 0522 07/02/17 0519 07/02/17 2104 07/03/17 0616  HGB  --   < > 12.2 11.7*  --  11.8*  HCT  --   --  37.6 36.9  --  37.6  PLT  --   --  202 189  --  159  HEPARINUNFRC  --   --  0.47 0.43  --  0.26*  CREATININE  --   --  1.17*  --   --   --   TROPONINI 0.04*  --  0.04*  --  <0.03  --   < > = values in this interval not displayed.  Estimated Creatinine Clearance: 24.3 mL/min (A) (by C-G formula based on SCr of 1.17 mg/dL (H)).   Medications:  Scheduled:  . aspirin  324 mg Oral Once  . aspirin EC  81 mg Oral Daily  . atorvastatin  10 mg Oral q1800  . carvedilol  3.125 mg Oral BID WC  . clopidogrel  75 mg Oral Daily  . isosorbide mononitrate  15 mg Oral Daily  . levothyroxine  50 mcg Oral QAC breakfast  . magnesium oxide  400 mg Oral BID   Infusions:  . heparin 800 Units/hr (07/02/17 1457)    Assessment: 83 y/o female with history of CAD on heparin for ACS r/o. Heparin was stopped yesterday morning due to decision to manage symptoms medically, however patient had recurrent chest pain yesterday and heparin was restarted at previously therapeutic rate. Daily heparin level this morning was slightly subtherapeutic at 0.26. No line issues noted per RN. No further chest pain noted.  Goal of Therapy:  Heparin level 0.3-0.7 units/ml Monitor platelets by anticoagulation protocol: Yes   Plan:  Increase heparin to 900 units/hr Follow up heparin level in 8 hours Daily heparin level, CBC while on heparin Follow up length of  therapy   Charlene Brooke, PharmD PGY1 Pharmacy Resident Phone: [phone] After 3:30PM please call [place] [phone] 07/03/2017,8:28 AM

## 2017-07-03 NOTE — Progress Notes (Signed)
Pt discharged to home, accompanied by spouse and daughter. Pt A&Ox4 upon discharge, without c/o pain or discomfort. PIVs removed.  AVS reviewed with pt and family, questions addressed.  Pt left unit via wheelchair at approximately 1200, pt to be transported home by daughter.

## 2017-07-06 ENCOUNTER — Telehealth: Payer: Self-pay | Admitting: *Deleted

## 2017-07-06 NOTE — Consult Note (Signed)
           [facility] CM Primary Care Navigator  07/06/2017  Lori [name] [birth year] [id number]   Attemptto seepatient at the bedsideto identify possible discharge needs but she was already discharged.  Patient was discharged home over the weekend.  Primary care provider's office called (Kirstin) to notify of patient's discharge, need for post hospital follow-up and transition of care and health issues needing follow-up (mainly HF).  Made aware to refer patient to [facility] care management if deemed necessary and appropriate for services.   For questions, please contact:  Dannielle Huh, BSN, RN- [facility] Primary Care Navigator  Telephone: [phone] [place]

## 2017-07-06 NOTE — Telephone Encounter (Signed)
Call Completed and Appointment Scheduled:Call completed. Spoke with Kathrine Cords, the patient's daughter. She did not want to schedule an appointment during the phone call but will call back at a later time to schedule. Mrs Corbo is following up with Cardiology on 07/19/17.   DISCHARGE INFORMATION Date of Discharge:07/03/17  Discharge Facility: [facility]  Principal Discharge Diagnosis: heart failure  Patient and/or caregiver is knowledgeable of his/her condition(s) and treatment: Yes  MEDICATION RECONCILIATION Current medication list reviewed with patient:Yes  Outpatient Encounter Prescriptions as of 07/06/2017  Medication Sig  . aspirin 81 MG tablet Take 81 mg by mouth daily.    [name] Kitchen atorvastatin (LIPITOR) 10 MG tablet Take 1 tablet (10 mg total) by mouth daily at 6 PM.  . carvedilol (COREG) 3.125 MG tablet Take 1 tablet (3.125 mg total) by mouth 2 (two) times daily with a meal.  . clopidogrel (PLAVIX) 75 MG tablet Take 1 tablet (75 mg total) by mouth daily.  . furosemide (LASIX) 40 MG tablet Take 1 tablet (40 mg total) by mouth daily. (Patient not taking: Reported on 04/21/2017)  . isosorbide mononitrate (IMDUR) 30 MG 24 hr tablet Take 1 tablet (30 mg total) by mouth daily.  [name] Kitchen ketoconazole (NIZORAL) 2 % cream Apply topically daily behind ears when sore, as directed  . levothyroxine (SYNTHROID, LEVOTHROID) 50 MCG tablet Take 1 Tablet by mouth once daily before breakfast  . nitroGLYCERIN (NITROSTAT) 0.4 MG SL tablet Place 1 tablet (0.4 mg total) under the tongue every 5 (five) minutes as needed for chest pain.  [name] Kitchen triamcinolone (KENALOG) 0.025 % ointment Apply 1 application topically 2 (two) times daily.   No facility-administered encounter medications on file as of 07/06/2017.     Discharge Medications reviewed and reconciled with current medications.yes  Patient is able to obtain needed medications:Yes  ACTIVITIES OF DAILY LIVING  Is the patient able to perform his/her own ADLs: Yes.   with assistance from daughter  Patient is receiving home health services: No.  PATIENT EDUCATION Questions/Concerns Discussed: Daughter didn't believe she would feel like coming out this week for an appointment. She will f/u if needed and schedule hosp f/u when she is feeling a little stronger.   Chong Sicilian, RN

## 2017-07-08 ENCOUNTER — Other Ambulatory Visit: Payer: Self-pay | Admitting: Pediatrics

## 2017-07-19 ENCOUNTER — Encounter: Payer: Self-pay | Admitting: Physician Assistant

## 2017-07-19 ENCOUNTER — Ambulatory Visit (INDEPENDENT_AMBULATORY_CARE_PROVIDER_SITE_OTHER): Payer: Medicare Other | Admitting: Physician Assistant

## 2017-07-19 VITALS — BP 148/52 | HR 80 | Ht 62.0 in | Wt 123.0 lb

## 2017-07-19 DIAGNOSIS — I25118 Atherosclerotic heart disease of native coronary artery with other forms of angina pectoris: Secondary | ICD-10-CM | POA: Diagnosis not present

## 2017-07-19 DIAGNOSIS — I6523 Occlusion and stenosis of bilateral carotid arteries: Secondary | ICD-10-CM | POA: Diagnosis not present

## 2017-07-19 DIAGNOSIS — I10 Essential (primary) hypertension: Secondary | ICD-10-CM

## 2017-07-19 DIAGNOSIS — I5022 Chronic systolic (congestive) heart failure: Secondary | ICD-10-CM

## 2017-07-19 DIAGNOSIS — I209 Angina pectoris, unspecified: Secondary | ICD-10-CM

## 2017-07-19 LAB — BASIC METABOLIC PANEL
BUN/Creatinine Ratio: 21 (ref 12–28)
BUN: 21 mg/dL (ref 10–36)
CALCIUM: 8.8 mg/dL (ref 8.7–10.3)
CO2: 24 mmol/L (ref 20–29)
CREATININE: 1 mg/dL (ref 0.57–1.00)
Chloride: 96 mmol/L (ref 96–106)
GFR calc Af Amer: 55 mL/min/{1.73_m2} — ABNORMAL LOW (ref >59)
GFR, EST NON AFRICAN AMERICAN: 48 mL/min/{1.73_m2} — AB (ref >59)
Glucose: 95 mg/dL (ref 65–99)
Potassium: 4.8 mmol/L (ref 3.5–5.2)
Sodium: 135 mmol/L (ref 134–144)

## 2017-07-19 NOTE — Patient Instructions (Addendum)
Medication Instructions:  OK TO TAKE PLAVIX AT SUPPERTIME If you need a refill on your cardiac medications before your next appointment, please call your pharmacy.  Labwork: BMET TODAY HERE IN OUR OFFICE AT [facility]  Testing/Procedures: LET us KNOW IF YOU WOULD LIKE TO GET THE CAROTID SCAN  Special Instructions: CONTINUE DAILY WEIGHTS  FOLLOW LOW SODIUM DIET  Follow-Up: Your physician wants you to follow-up in: [facility].  Thank you for choosing [facility] at [facility]!!       Low-Sodium Eating Plan Sodium, which is an element that makes up salt, helps you maintain a healthy balance of fluids in your body. Too much sodium can increase your blood pressure and cause fluid and waste to be held in your body. Your health care provider or dietitian may recommend following this plan if you have high blood pressure (hypertension), kidney disease, liver disease, or heart failure. Eating less sodium can help lower your blood pressure, reduce swelling, and protect your heart, liver, and kidneys. What are tips for following this plan? General guidelines  Most people on this plan should limit their sodium intake to 1,500-2,000 mg (milligrams) of sodium each day. Reading food labels  The Nutrition Facts label lists the amount of sodium in one serving of the food. If you eat more than one serving, you must multiply the listed amount of sodium by the number of servings.  Choose foods with less than 140 mg of sodium per serving.  Avoid foods with 300 mg of sodium or more per serving. Shopping  Look for lower-sodium products, often labeled as "low-sodium" or "no salt added."  Always check the sodium content even if foods are labeled as "unsalted" or "no salt added".  Buy fresh foods. ? Avoid canned foods and premade or frozen meals. ? Avoid canned, cured, or processed meats  Buy breads that have less than 80 mg of sodium per slice. Cooking  Eat  more home-cooked food and less restaurant, buffet, and fast food.  Avoid adding salt when cooking. Use salt-free seasonings or herbs instead of table salt or sea salt. Check with your health care provider or pharmacist before using salt substitutes.  Cook with plant-based oils, such as canola, sunflower, or olive oil. Meal planning  When eating at a restaurant, ask that your food be prepared with less salt or no salt, if possible.  Avoid foods that contain MSG (monosodium glutamate). MSG is sometimes added to [place] food, bouillon, and some canned foods. What foods are recommended? The items listed may not be a complete list. Talk with your dietitian about what dietary choices are best for you. Grains Low-sodium cereals, including oats, puffed wheat and rice, and shredded wheat. Low-sodium crackers. Unsalted rice. Unsalted pasta. Low-sodium bread. Whole-grain breads and whole-grain pasta. Vegetables Fresh or frozen vegetables. "No salt added" canned vegetables. "No salt added" tomato sauce and paste. Low-sodium or reduced-sodium tomato and vegetable juice. Fruits Fresh, frozen, or canned fruit. Fruit juice. Meats and other protein foods Fresh or frozen (no salt added) meat, poultry, seafood, and fish. Low-sodium canned tuna and salmon. Unsalted nuts. Dried peas, beans, and lentils without added salt. Unsalted canned beans. Eggs. Unsalted nut butters. Dairy Milk. Soy milk. Cheese that is naturally low in sodium, such as ricotta cheese, fresh mozzarella, or Swiss cheese Low-sodium or reduced-sodium cheese. Cream cheese. Yogurt. Fats and oils Unsalted butter. Unsalted margarine with no trans fat. Vegetable oils such as canola or olive oils. Seasonings and other foods  Fresh and dried herbs and spices. Salt-free seasonings. Low-sodium mustard and ketchup. Sodium-free salad dressing. Sodium-free light mayonnaise. Fresh or refrigerated horseradish. Lemon juice. Vinegar. Homemade, reduced-sodium,  or low-sodium soups. Unsalted popcorn and pretzels. Low-salt or salt-free chips. What foods are not recommended? The items listed may not be a complete list. Talk with your dietitian about what dietary choices are best for you. Grains Instant hot cereals. Bread stuffing, pancake, and biscuit mixes. Croutons. Seasoned rice or pasta mixes. Noodle soup cups. Boxed or frozen macaroni and cheese. Regular salted crackers. Self-rising flour. Vegetables Sauerkraut, pickled vegetables, and relishes. Olives. [place] fries. Onion rings. Regular canned vegetables (not low-sodium or reduced-sodium). Regular canned tomato sauce and paste (not low-sodium or reduced-sodium). Regular tomato and vegetable juice (not low-sodium or reduced-sodium). Frozen vegetables in sauces. Meats and other protein foods Meat or fish that is salted, canned, smoked, spiced, or pickled. Bacon, ham, sausage, hotdogs, corned beef, chipped beef, packaged lunch meats, salt pork, jerky, pickled herring, anchovies, regular canned tuna, sardines, salted nuts. Dairy Processed cheese and cheese spreads. Cheese curds. Blue cheese. Feta cheese. String cheese. Regular cottage cheese. Buttermilk. Canned milk. Fats and oils Salted butter. Regular margarine. Ghee. Bacon fat. Seasonings and other foods Onion salt, garlic salt, seasoned salt, table salt, and sea salt. Canned and packaged gravies. Worcestershire sauce. Tartar sauce. Barbecue sauce. Teriyaki sauce. Soy sauce, including reduced-sodium. Steak sauce. Fish sauce. Oyster sauce. Cocktail sauce. Horseradish that you find on the shelf. Regular ketchup and mustard. Meat flavorings and tenderizers. Bouillon cubes. Hot sauce and Tabasco sauce. Premade or packaged marinades. Premade or packaged taco seasonings. Relishes. Regular salad dressings. Salsa. Potato and tortilla chips. Corn chips and puffs. Salted popcorn and pretzels. Canned or dried soups. Pizza. Frozen entrees and pot  pies. Summary  Eating less sodium can help lower your blood pressure, reduce swelling, and protect your heart, liver, and kidneys.  Most people on this plan should limit their sodium intake to 1,500-2,000 mg (milligrams) of sodium each day.  Canned, boxed, and frozen foods are high in sodium. Restaurant foods, fast foods, and pizza are also very high in sodium. You also get sodium by adding salt to food.  Try to cook at home, eat more fresh fruits and vegetables, and eat less fast food, canned, processed, or prepared foods. This information is not intended to replace advice given to you by your health care provider. Make sure you discuss any questions you have with your health care provider. Document Released: 02/27/2002 Document Revised: 08/31/2016 Document Reviewed: 08/31/2016 Elsevier Interactive Patient Education  2017 [employer].

## 2017-07-19 NOTE — Progress Notes (Signed)
Cardiology Office Note   Date:  07/19/2017   ID:  Lori [name], DOB [birth year], MRN [id number]  PCP:  Eustaquio Maize, MD  Cardiologist:  Dr Mardene Celeste, PA-C   Chief Complaint  Patient presents with  . Follow-up    Post hospital    History of Present Illness: Lori [name] is a 83 y.o. female with a history of  NSTEMI x 2 with PCI and DES to distal Cx and OM2 in 2011, NSTEMI in 2013 treated conservatively), HTN, HLD, hx of chronic systolic heart failure (EF 35-40% by echo in 02/2012), and carotid artery stenosis s/p LCEA in 2002.   Admitted 10/10-10/13 w/ CP, CHF. Med mgt for CAD/CP w/ minimal trop elevation, wt at d/c 121.  Lori [name] presents for cardiology follow up.  Since d/c from the hospital, she has done fairly well.   She has had some exertional chest heaviness, rx w/ nitro w/ success.   Her weight has been between 119-120 lbs. She had some sausage and a hamburger recently. Her BP and weight went up after that.   Her BP has been low at times at home, she has been checking it. The lowest one was 98/51, the highest 172/74 (after the sausage).   She has not had LE edema. She has DOE still, denies orthopnea or PND.   This am, she was dizzy getting out of bed.   She was nauseated this am, that is not unusual for her. She has noticed this more since coming home from hospital. She is not sure if it was before or after her morning medications.  She has decided she does not want to take a statin anymore.   Past Medical History:  Diagnosis Date  . Arthritis   . CAD (coronary artery disease)    a. NSTEMI in 2011 s/p PCI/DES to distal Cx, s/p PCI/DES to OM2 (EF 40%). b. NSTEMI 02/2012 treated conservatively.  . Carotid artery stenosis    Left carotid endarterectomy 02/2001  . CHF (congestive heart failure) ([place])    a. EF 40% in 2011. b. EF 35-40% by echo 03/12/12.  [name] Kitchen Hyperlipidemia   . Hypertension   . Hyponatremia    Noted on 02/2012  admission  . Pneumonia    02/2012  . Renal insufficiency    Noted on 02/2012 admission  . Respiratory failure ([place])    02/2012 - requiring bipap - multifactorial in setting of PNA/CHF/NSTEMI  . Thyroid disease    Euthyroid 02/2012    Past Surgical History:  Procedure Laterality Date  . CARDIAC CATHETERIZATION    . CAROTID ENDARTERECTOMY     left 02/2001    Current Outpatient Prescriptions  Medication Sig Dispense Refill  . aspirin 81 MG tablet Take 81 mg by mouth daily.      [name] Kitchen atorvastatin (LIPITOR) 10 MG tablet Take 1 tablet (10 mg total) by mouth daily at 6 PM. 30 tablet 6  . carvedilol (COREG) 3.125 MG tablet Take 1 tablet (3.125 mg total) by mouth 2 (two) times daily with a meal. 180 tablet 0  . clopidogrel (PLAVIX) 75 MG tablet Take 1 Tablet by mouth once daily 90 tablet 0  . furosemide (LASIX) 40 MG tablet Take 1 tablet (40 mg total) by mouth daily. 90 tablet 3  . isosorbide mononitrate (IMDUR) 30 MG 24 hr tablet Take 1 tablet (30 mg total) by mouth daily. 90 tablet 3  . ketoconazole (NIZORAL) 2 % cream Apply topically  daily behind ears when sore, as directed 15 g 0  . levothyroxine (SYNTHROID, LEVOTHROID) 50 MCG tablet Take 1 Tablet by mouth once daily before breakfast 30 tablet 10  . nitroGLYCERIN (NITROSTAT) 0.4 MG SL tablet Place 1 tablet (0.4 mg total) under the tongue every 5 (five) minutes as needed for chest pain. 25 tablet 3  . triamcinolone (KENALOG) 0.025 % ointment Apply 1 application topically 2 (two) times daily. 30 g 0   No current facility-administered medications for this visit.     Allergies:   Patient has no known allergies.    Social History:  The patient  reports that she has never smoked. She has never used smokeless tobacco. She reports that she does not drink alcohol or use drugs.   Family History:  The patient's family history includes Coronary artery disease (age of onset: 69) in her brother; Coronary artery disease (age of onset: 58) in her brother;  Heart failure in her sister and sister; Heart failure (age of onset: 108) in her mother; Prostate cancer in her unknown relative; Rheum arthritis in her father.    ROS:  Please see the history of present illness. All other systems are reviewed and negative.    PHYSICAL EXAM: VS:  BP (!) 148/52   Pulse 80   Ht 5\' 2"  (1.575 m)   Wt 123 lb (55.8 kg)   BMI 22.50 kg/m  , BMI Body mass index is 22.5 kg/m. GEN: Well nourished, well developed, female in no acute distress  HEENT: normal for age  Neck: no JVD, R>L carotid bruits, no masses Cardiac: RRR; soft murmur, no rubs, or gallops Respiratory:  clear to auscultation bilaterally, normal work of breathing GI: soft, nontender, nondistended, + BS MS: no deformity or atrophy; no edema; distal pulses are 2+ in all 4 extremities   Skin: warm and dry, no rash Neuro:  Strength and sensation are intact Psych: euthymic mood, full affect   EKG:  EKG is not ordered today.  ECHO: 02/2012 - Left ventricle: The cavity size was normal. Wall thickness was increased in a pattern of mild LVH. There appears to be severe hypokinesis of the apex and the apical anterior/septal/lateral/inferior segments. Systolic function was moderately reduced. The estimated ejection fraction was in the range of 35% to 40%. Doppler parameters are consistent with abnormal left ventricular relaxation (grade 1 diastolic dysfunction). - Aortic valve: There was no stenosis. Trivialregurgitation. - Mitral valve: Mild regurgitation. - Left atrium: The atrium was mildly to moderately dilated. - Right ventricle: The cavity size was normal. Systolic function was normal. - Tricuspid valve: Peak RV-RA gradient: 10mm Hg (S). - Pulmonary arteries: PA peak pressure: 6mm Hg (S). - Systemic veins: IVC measured 2.3 cm with some respirophasic variation, suggesting RA pressure 10 mmHg. Impressions: - Normal LV size with mild LV hypertrophy. EF 35-40%  with peri-apical hypokinesis. Normal RV size and systolic function. Mild pulmonary hypertension.  Recent Labs: 04/21/2017: TSH 2.520 06/30/2017: ALT 24; B Natriuretic Peptide 665.4; Magnesium 1.8 07/01/2017: BUN 19; Creatinine, Ser 1.17; Potassium 4.2; Sodium 134 07/03/2017: Hemoglobin 11.8; Platelets 159    Lipid Panel    Component Value Date/Time   CHOL 181 07/01/2017 0522   TRIG 87 07/01/2017 0522   HDL 61 07/01/2017 0522   CHOLHDL 3.0 07/01/2017 0522   VLDL 17 07/01/2017 0522   LDLCALC 103 (H) 07/01/2017 0522     Wt Readings from Last 3 Encounters:  07/19/17 123 lb (55.8 kg)  07/03/17 121 lb 14.4 oz (55.3  kg)  04/21/17 126 lb 3.2 oz (57.2 kg)     Other studies Reviewed: Additional studies/ records that were reviewed today include: Office notes, hospital records and testing.  ASSESSMENT AND PLAN:  1.  Chronic systolic CHF: Her volume status is good by exam. Her weight is generally stable, but she is apparently sodium sensitive and tends to gain weight when she eats salty foods. She is encouraged to limit the sodium and the foods that she eats and track her weight. No change in Lasix dosing. Check a BMET.  2. Nausea: I'm concerned that this may be a side effect of her medications. I will move the Plavix to supper time and effort to minimize this. If this continues, may need to look at some of her other medications.  3. CAD, Stable angina: She is having consistent angina with exertion. Unfortunately, she is also having orthostatic dizziness. As her weight has been stable, this is possibly a side effect of the Imdur. She does not wish to try Ranexa because of the expense. Upon reviewing her home blood pressure records, her systolic blood pressure has been less than 100 at times so I cannot increase her beta blocker. She is encouraged to increase her activity as tolerated, and use the nitroglycerin as needed.  4. Carotid artery disease: Her last carotid Dopplers were 2012. At  that time, she had 40-59 percent carotid stenosis, right greater than left. She does not wish to have them rechecked at this time. She is not having headaches, visual changes or other symptoms reminiscent of a TIA. She has any symptoms, she is to seek help. If she changes her mind about the testing, she will call.  5. Hyperlipidemia: Her goal LDL is 100 and it was slightly above that on recent profile checked. However, she is refusing to use take a statin. She is encouraged to eat a low-cholesterol diet.  Current medicines are reviewed at length with the patient today.  The patient does not have concerns regarding medicines.  The following changes have been made:  no change  Labs/ tests ordered today include:   Orders Placed This Encounter  Procedures  . Basic metabolic panel     Disposition:   FU with Dr Percival Spanish  Signed, Rosaria Ferries, PA-C  07/19/2017 11:49 AM    [place] Phone: [phone]; Fax: [phone]  This note was written with the assistance of speech recognition software. Please excuse any transcriptional errors.

## 2017-08-06 ENCOUNTER — Other Ambulatory Visit: Payer: Self-pay | Admitting: Cardiology

## 2017-08-14 DIAGNOSIS — J069 Acute upper respiratory infection, unspecified: Secondary | ICD-10-CM | POA: Diagnosis not present

## 2017-08-14 DIAGNOSIS — R05 Cough: Secondary | ICD-10-CM | POA: Diagnosis not present

## 2017-08-18 ENCOUNTER — Encounter: Payer: Self-pay | Admitting: Cardiology

## 2017-08-23 NOTE — Progress Notes (Signed)
HPIhas The patient presents for evaluation of syncope.  This happened in Dec of last year.  She didn't go to the emergency room.   This event is described in a previous note.  She did see a neurologist.  There was consideration of having a CT and CTA to rule out vertebrobasilar insufficiency but the patient decided not to have this.  She was in the hospital in Oct with unstable angina.  She was managed medically.  She has since had rare chest pain.  She is needed nitroglycerin about 3 times but each time the pain goes away with one pill.  It comes on sporadically.  She can do household chores without bringing this on.  She does not describe any severe associated symptoms.  She has not had any further syncopal episodes.  She actually thinks she is doing well.  No Known Allergies  Current Outpatient Medications  Medication Sig Dispense Refill  . aspirin 81 MG tablet Take 81 mg by mouth daily.      [name] Kitchen atorvastatin (LIPITOR) 10 MG tablet Take 1 tablet (10 mg total) by mouth daily at 6 PM. 30 tablet 6  . carvedilol (COREG) 3.125 MG tablet Take 1 tablet (3.125 mg total) by mouth 2 (two) times daily with a meal. 180 tablet 0  . clopidogrel (PLAVIX) 75 MG tablet Take 1 Tablet by mouth once daily 90 tablet 0  . furosemide (LASIX) 40 MG tablet TAKE 1 TABLET BY MOUTH ONCE DAILY 90 tablet 0  . isosorbide mononitrate (IMDUR) 30 MG 24 hr tablet Take 1 tablet (30 mg total) by mouth daily. 90 tablet 3  . ketoconazole (NIZORAL) 2 % cream Apply topically daily behind ears when sore, as directed 15 g 0  . levothyroxine (SYNTHROID, LEVOTHROID) 50 MCG tablet Take 1 Tablet by mouth once daily before breakfast 30 tablet 10  . nitroGLYCERIN (NITROSTAT) 0.4 MG SL tablet Place 1 tablet (0.4 mg total) under the tongue every 5 (five) minutes as needed for chest pain. 25 tablet 6  . triamcinolone (KENALOG) 0.025 % ointment Apply 1 application topically 2 (two) times daily. 30 g 0   No current facility-administered  medications for this visit.     Past Medical History:  Diagnosis Date  . Arthritis   . CAD (coronary artery disease)    a. NSTEMI in 2011 s/p PCI/DES to distal Cx, s/p PCI/DES to OM2 (EF 40%). b. NSTEMI 02/2012 treated conservatively.  . Carotid artery stenosis    Left carotid endarterectomy 02/2001  . CHF (congestive heart failure) ([place])    a. EF 40% in 2011. b. EF 35-40% by echo 03/12/12.  [name] Kitchen Hyperlipidemia   . Hypertension   . Hyponatremia    Noted on 02/2012 admission  . Pneumonia    02/2012  . Renal insufficiency    Noted on 02/2012 admission  . Respiratory failure ([place])    02/2012 - requiring bipap - multifactorial in setting of PNA/CHF/NSTEMI  . Thyroid disease    Euthyroid 02/2012    Past Surgical History:  Procedure Laterality Date  . CARDIAC CATHETERIZATION    . CAROTID ENDARTERECTOMY     left 02/2001    ROS   Otherwise as stated in the HPI and negative for all other systems. PHYSICAL EXAM BP (!) 111/56   Pulse 78   Ht 5\' 4"  (1.626 m)   Wt 120 lb (54.4 kg)   SpO2 98%   BMI 20.60 kg/m   GENERAL:  Well appearing NECK:  No jugular  venous distention, waveform within normal limits, carotid upstroke brisk and symmetric, no bruits, no thyromegaly LUNGS:  Clear to auscultation bilaterally CHEST:  Unremarkable HEART:  PMI not displaced or sustained,S1 and S2 within normal limits, no S3, no S4, no clicks, no rubs, no murmurs ABD:  Flat, positive bowel sounds normal in frequency in pitch, no bruits, no rebound, no guarding, no midline pulsatile mass, no hepatomegaly, no splenomegaly EXT:  2 plus pulses throughout, no edema, no cyanosis no clubbing    EKG: NA  ASSESSMENT AND PLAN  Coronary artery disease  She has some intermittent angina but it seems to be mild and somewhat stable pattern.  Given the ongoing pain I am going to continue DAPT.  Otherwise no change in therapy.   Ischemic cardiomyopathy  She is euvolemic.  No change in therapy.   Chronic kidney disease    Creat was 1.0.   This is followed by Eustaquio Maize, MD  Hypertension  The blood pressure is at target.  No change in therapy.   Syncope She has had no further events.

## 2017-08-25 ENCOUNTER — Encounter: Payer: Self-pay | Admitting: *Deleted

## 2017-08-25 ENCOUNTER — Ambulatory Visit (INDEPENDENT_AMBULATORY_CARE_PROVIDER_SITE_OTHER): Payer: Medicare Other | Admitting: Cardiology

## 2017-08-25 VITALS — BP 111/56 | HR 78 | Ht 64.0 in | Wt 120.0 lb

## 2017-08-25 DIAGNOSIS — I10 Essential (primary) hypertension: Secondary | ICD-10-CM | POA: Diagnosis not present

## 2017-08-25 DIAGNOSIS — I6523 Occlusion and stenosis of bilateral carotid arteries: Secondary | ICD-10-CM

## 2017-08-25 DIAGNOSIS — I2510 Atherosclerotic heart disease of native coronary artery without angina pectoris: Secondary | ICD-10-CM | POA: Diagnosis not present

## 2017-08-25 MED ORDER — NITROGLYCERIN 0.4 MG SL SUBL: 0.4000 mg | SUBLINGUAL_TABLET | SUBLINGUAL | 6 refills | Status: DC | PRN

## 2017-08-25 NOTE — Patient Instructions (Signed)
Medication Instructions:  The current medical regimen is effective;  continue present plan and medications.  Follow-Up: Follow up in 6 months with Dr. Hochrein.  You will receive a letter in the mail 2 months before you are due.  Please call us when you receive this letter to schedule your follow up appointment.  If you need a refill on your cardiac medications before your next appointment, please call your pharmacy.  Thank you for choosing [facility]!!       

## 2017-09-08 ENCOUNTER — Other Ambulatory Visit: Payer: Self-pay | Admitting: *Deleted

## 2017-09-08 MED ORDER — LEVOTHYROXINE SODIUM 50 MCG PO TABS: ORAL_TABLET | ORAL | 1 refills | Status: AC

## 2017-10-26 ENCOUNTER — Inpatient Hospital Stay (HOSPITAL_COMMUNITY)
Admission: EM | Admit: 2017-10-26 | Discharge: 2017-10-28 | DRG: 281 | Disposition: A | Discharge status: Home / Self Care | Payer: Medicare Other | Attending: Internal Medicine | Admitting: Internal Medicine

## 2017-10-26 ENCOUNTER — Telehealth: Payer: Self-pay | Admitting: *Deleted

## 2017-10-26 ENCOUNTER — Emergency Department (HOSPITAL_COMMUNITY): Payer: Medicare Other

## 2017-10-26 ENCOUNTER — Encounter (HOSPITAL_COMMUNITY): Payer: Self-pay | Admitting: Emergency Medicine

## 2017-10-26 DIAGNOSIS — I5022 Chronic systolic (congestive) heart failure: Secondary | ICD-10-CM

## 2017-10-26 DIAGNOSIS — Z955 Presence of coronary angioplasty implant and graft: Secondary | ICD-10-CM

## 2017-10-26 DIAGNOSIS — N183 Chronic kidney disease, stage 3 unspecified: Secondary | ICD-10-CM

## 2017-10-26 DIAGNOSIS — Z7982 Long term (current) use of aspirin: Secondary | ICD-10-CM

## 2017-10-26 DIAGNOSIS — I2510 Atherosclerotic heart disease of native coronary artery without angina pectoris: Secondary | ICD-10-CM | POA: Diagnosis present

## 2017-10-26 DIAGNOSIS — Z9889 Other specified postprocedural states: Secondary | ICD-10-CM | POA: Diagnosis not present

## 2017-10-26 DIAGNOSIS — I130 Hypertensive heart and chronic kidney disease with heart failure and stage 1 through stage 4 chronic kidney disease, or unspecified chronic kidney disease: Secondary | ICD-10-CM | POA: Diagnosis present

## 2017-10-26 DIAGNOSIS — Z7902 Long term (current) use of antithrombotics/antiplatelets: Secondary | ICD-10-CM

## 2017-10-26 DIAGNOSIS — M48061 Spinal stenosis, lumbar region without neurogenic claudication: Secondary | ICD-10-CM

## 2017-10-26 DIAGNOSIS — I5042 Chronic combined systolic (congestive) and diastolic (congestive) heart failure: Secondary | ICD-10-CM

## 2017-10-26 DIAGNOSIS — I209 Angina pectoris, unspecified: Secondary | ICD-10-CM | POA: Diagnosis present

## 2017-10-26 DIAGNOSIS — I214 Non-ST elevation (NSTEMI) myocardial infarction: Secondary | ICD-10-CM | POA: Diagnosis not present

## 2017-10-26 DIAGNOSIS — E039 Hypothyroidism, unspecified: Secondary | ICD-10-CM | POA: Diagnosis present

## 2017-10-26 DIAGNOSIS — I213 ST elevation (STEMI) myocardial infarction of unspecified site: Principal | ICD-10-CM | POA: Diagnosis present

## 2017-10-26 DIAGNOSIS — I255 Ischemic cardiomyopathy: Secondary | ICD-10-CM | POA: Diagnosis present

## 2017-10-26 DIAGNOSIS — I25118 Atherosclerotic heart disease of native coronary artery with other forms of angina pectoris: Secondary | ICD-10-CM | POA: Diagnosis present

## 2017-10-26 DIAGNOSIS — E782 Mixed hyperlipidemia: Secondary | ICD-10-CM | POA: Diagnosis not present

## 2017-10-26 DIAGNOSIS — R079 Chest pain, unspecified: Secondary | ICD-10-CM | POA: Diagnosis not present

## 2017-10-26 DIAGNOSIS — I252 Old myocardial infarction: Secondary | ICD-10-CM

## 2017-10-26 DIAGNOSIS — R0602 Shortness of breath: Secondary | ICD-10-CM | POA: Diagnosis not present

## 2017-10-26 DIAGNOSIS — I10 Essential (primary) hypertension: Secondary | ICD-10-CM | POA: Diagnosis not present

## 2017-10-26 DIAGNOSIS — J9811 Atelectasis: Secondary | ICD-10-CM | POA: Diagnosis present

## 2017-10-26 DIAGNOSIS — Z79899 Other long term (current) drug therapy: Secondary | ICD-10-CM

## 2017-10-26 LAB — CBC
HCT: 36.3 % (ref 36.0–46.0)
HEMOGLOBIN: 11.3 g/dL — AB (ref 12.0–15.0)
MCH: 26 pg (ref 26.0–34.0)
MCHC: 31.1 g/dL (ref 30.0–36.0)
MCV: 83.4 fL (ref 78.0–100.0)
Platelets: 234 10*3/uL (ref 150–400)
RBC: 4.35 MIL/uL (ref 3.87–5.11)
RDW: 15.2 % (ref 11.5–15.5)
WBC: 8.2 10*3/uL (ref 4.0–10.5)

## 2017-10-26 LAB — BASIC METABOLIC PANEL
ANION GAP: 9 (ref 5–15)
BUN: 17 mg/dL (ref 6–20)
CALCIUM: 8.3 mg/dL — AB (ref 8.9–10.3)
CO2: 22 mmol/L (ref 22–32)
Chloride: 101 mmol/L (ref 101–111)
Creatinine, Ser: 0.9 mg/dL (ref 0.44–1.00)
GFR calc non Af Amer: 52 mL/min — ABNORMAL LOW (ref >60)
GLUCOSE: 90 mg/dL (ref 65–99)
POTASSIUM: 4.8 mmol/L (ref 3.5–5.1)
SODIUM: 132 mmol/L — AB (ref 135–145)

## 2017-10-26 LAB — PROTIME-INR
INR: 1.18
PROTHROMBIN TIME: 14.9 s (ref 11.4–15.2)

## 2017-10-26 LAB — TROPONIN I
TROPONIN I: 0.32 ng/mL — AB (ref <0.03)
TROPONIN I: 1.04 ng/mL — AB (ref <0.03)

## 2017-10-26 LAB — APTT: aPTT: 146 seconds — ABNORMAL HIGH (ref 24–36)

## 2017-10-26 MED ORDER — LEVOTHYROXINE SODIUM 50 MCG PO TABS
50.0000 ug | ORAL_TABLET | Freq: Every day | ORAL | Status: DC
  Administered 2017-10-27 – 2017-10-28 (×2): 50 ug via ORAL
  Filled 2017-10-26 (×2): qty 1

## 2017-10-26 MED ORDER — ENOXAPARIN SODIUM 40 MG/0.4ML ~~LOC~~ SOLN: 40.0000 mg | SUBCUTANEOUS | Status: DC

## 2017-10-26 MED ORDER — CLOPIDOGREL BISULFATE 75 MG PO TABS
75.0000 mg | ORAL_TABLET | Freq: Every day | ORAL | Status: DC
  Administered 2017-10-26 – 2017-10-28 (×3): 75 mg via ORAL
  Filled 2017-10-26 (×3): qty 1

## 2017-10-26 MED ORDER — HEPARIN BOLUS VIA INFUSION
3000.0000 [IU] | Freq: Once | INTRAVENOUS | Status: AC
  Administered 2017-10-26: 3000 [IU] via INTRAVENOUS

## 2017-10-26 MED ORDER — FUROSEMIDE 40 MG PO TABS
40.0000 mg | ORAL_TABLET | Freq: Every day | ORAL | Status: DC
  Administered 2017-10-27 – 2017-10-28 (×2): 40 mg via ORAL
  Filled 2017-10-26 (×3): qty 1

## 2017-10-26 MED ORDER — CARVEDILOL 3.125 MG PO TABS
3.1250 mg | ORAL_TABLET | Freq: Two times a day (BID) | ORAL | Status: DC
  Administered 2017-10-26 – 2017-10-28 (×4): 3.125 mg via ORAL
  Filled 2017-10-26 (×4): qty 1

## 2017-10-26 MED ORDER — NITROGLYCERIN 2 % TD OINT
0.5000 [in_us] | TOPICAL_OINTMENT | Freq: Once | TRANSDERMAL | Status: AC
  Administered 2017-10-26: 0.5 [in_us] via TOPICAL
  Filled 2017-10-26: qty 1

## 2017-10-26 MED ORDER — HEPARIN (PORCINE) IN NACL 100-0.45 UNIT/ML-% IJ SOLN
850.0000 [IU]/h | INTRAMUSCULAR | Status: AC
  Administered 2017-10-26: 650 [IU]/h via INTRAVENOUS
  Administered 2017-10-27: 850 [IU]/h via INTRAVENOUS
  Filled 2017-10-26 (×2): qty 250

## 2017-10-26 MED ORDER — ONDANSETRON HCL 4 MG PO TABS: 4.0000 mg | ORAL_TABLET | Freq: Four times a day (QID) | ORAL | Status: DC | PRN

## 2017-10-26 MED ORDER — ONDANSETRON HCL 4 MG/2ML IJ SOLN
4.0000 mg | Freq: Four times a day (QID) | INTRAMUSCULAR | Status: DC | PRN
Start: 2017-10-26 — End: 2017-10-28

## 2017-10-26 MED ORDER — ATORVASTATIN CALCIUM 10 MG PO TABS
10.0000 mg | ORAL_TABLET | Freq: Every day | ORAL | Status: DC
  Administered 2017-10-27: 10 mg via ORAL
  Filled 2017-10-26 (×2): qty 1

## 2017-10-26 MED ORDER — NITROGLYCERIN 0.4 MG SL SUBL
0.4000 mg | SUBLINGUAL_TABLET | SUBLINGUAL | Status: DC | PRN
  Administered 2017-10-27: 0.4 mg via SUBLINGUAL
  Filled 2017-10-26: qty 1

## 2017-10-26 MED ORDER — ACETAMINOPHEN 325 MG PO TABS: 650.0000 mg | ORAL_TABLET | Freq: Four times a day (QID) | ORAL | Status: DC | PRN

## 2017-10-26 MED ORDER — ASPIRIN 81 MG PO CHEW
81.0000 mg | CHEWABLE_TABLET | Freq: Every day | ORAL | Status: DC
  Administered 2017-10-27 – 2017-10-28 (×2): 81 mg via ORAL
  Filled 2017-10-26 (×2): qty 1

## 2017-10-26 MED ORDER — ISOSORBIDE MONONITRATE ER 60 MG PO TB24
30.0000 mg | ORAL_TABLET | Freq: Every day | ORAL | Status: DC
  Administered 2017-10-27: 30 mg via ORAL
  Filled 2017-10-26: qty 1

## 2017-10-26 MED ORDER — ACETAMINOPHEN 650 MG RE SUPP: 650.0000 mg | Freq: Four times a day (QID) | RECTAL | Status: DC | PRN

## 2017-10-26 NOTE — ED Notes (Signed)
CRITICAL VALUE ALERT  Critical Value:  Troponin 0.32  Date & Time Notied:  10/26/17  13:00   Provider Notified:  Dr Maren Beach

## 2017-10-26 NOTE — Care Management Obs Status (Signed)
[place] NOTIFICATION   Patient Details  Name: Lori [name] MRN: [id number] Date of Birth: 02-Oct-1918   Medicare Observation Status Notification Given:  Yes    Sherald Barge, RN 10/26/2017, 3:45 PM

## 2017-10-26 NOTE — Telephone Encounter (Signed)
Daughter calls to say mother has taken two nitro pills this morning has a fever and feels bad.  She was inquiring on an appointment but was advised mother should go to hospital. Advised to call EMS and she did agree.

## 2017-10-26 NOTE — ED Notes (Signed)
Pt states she has had to use nitro almost every morning for months.

## 2017-10-26 NOTE — ED Triage Notes (Signed)
Pt reports having chest pain starting yesterday with resolution.  Began again this morning with 2 nitro taken.  Relief from nitro.

## 2017-10-26 NOTE — Consult Note (Signed)
Cardiology Consultation:   Patient ID: Lori [name]; [id number]; 01-11-1919   Admit date: 10/26/2017 Date of Consult: 10/26/2017  Primary Care Provider: Eustaquio Maize, MD Primary Cardiologist: Dr. Percival Spanish    Patient Profile:   Lori [name] is a 84 y.o. female with a hx of coronary artery disease and prior stenting who is being seen today for the evaluation of chest pain at the request of Dr. Carles Collet.  History of Present Illness:   Lori [name] is a pleasant 84 year old woman with a past medical history significant for coronary artery disease.  I reviewed her cardiac catheterization report dated 08/05/2010.  She had a non-STEMI at that time.  The LAD was diffusely diseased and heavily calcified with a focal 80% and segmental 90% stenosis proximally.  There was a 70% stenosis in the mid and distal vessel.  There was a 95% stenosis in the second obtuse marginal branch and tandem 90% stenoses in the distal circumflex artery before the second posterior lateral branch.  The RCA was a small to moderate sized vessel that was diffusely diseased and calcified.  It gave rise to a posterior descending and posterolateral branch.  There was a 90% ostial stenosis and a 90% stenosis in the mid to distal vessel.  There was a 95% stenosis distal to the PDA and a 90% stenosis in the posterior lateral branch.  She ultimately underwent percutaneous coronary intervention to the distal circumflex artery with a drug-eluting stent and a second obtuse marginal branch with another drug-eluting stent.  She had a non-STEMI in 2013 treated medically.  She has chronic systolic heart failure with LVEF 35-40% by echocardiogram in June 2013.  Past medical history also includes left carotid endarterectomy in June 2002, hypertension, and hyperlipidemia.  She was hospitalized in October 2018 for chest pain with minimally elevated troponins in the 0.04-0.05 range.  She was treated medically.  She was placed on IV heparin  for 48 hours.  She was also given IV nitroglycerin which was subsequently changed Imdur.  She has been maintained on dual antiplatelet therapy since that time.  She apparently sustained a syncopal episode in December 2018.  It appears there was consideration for CT and CTA to rule out vertebrobasilar insufficiency but the patient declined.  Relevant labs: Hemoglobin 11.3, troponin 0 0.32, BUN 17, creatinine 0.9, sodium 132.  Chest x-ray demonstrates small bilateral pleural effusions with bibasilar atelectasis.  ECG which I personally reviewed demonstrates sinus rhythm with old anteroseptal infarct, nonspecific T wave abnormalities inferiorly, and T wave inversions in the anterolateral leads suggestive of ischemia in that distribution.  I compared this to an ECG performed in October 2018 and there were no marked changes.  She is present with her daughter, Lori [name].  She normally uses about 2 or 3 nitroglycerin tablets per week.  She is also on Imdur 30 mg daily.  Yesterday she awoke in the morning and walked to the bathroom and developed chest pain.  She took one nitroglycerin tablet and her symptoms resolved.  She again developed retrosternal chest pain radiating to her back accompanied by shortness of breath this morning after awakening when walking to the bathroom.  She took 2 nitroglycerin tablets but symptoms did not resolve and she presented to the ED.  She was given nitroglycerin ointment and her symptoms have essentially resolved.  She currently denies chest pain, shortness of breath, palpitations, dizziness, nausea, and leg swelling.  Past Medical History:  Diagnosis Date  . Arthritis   . CAD (coronary  artery disease)    a. NSTEMI in 2011 s/p PCI/DES to distal Cx, s/p PCI/DES to OM2 (EF 40%). b. NSTEMI 02/2012 treated conservatively.  . Carotid artery stenosis    Left carotid endarterectomy 02/2001  . CHF (congestive heart failure) ([place])    a. EF 40% in 2011. b. EF 35-40% by echo  03/12/12.  [name] Kitchen Hyperlipidemia   . Hypertension   . Hyponatremia    Noted on 02/2012 admission  . Pneumonia    02/2012  . Renal insufficiency    Noted on 02/2012 admission  . Respiratory failure ([place])    02/2012 - requiring bipap - multifactorial in setting of PNA/CHF/NSTEMI  . Thyroid disease    Euthyroid 02/2012    Past Surgical History:  Procedure Laterality Date  . CARDIAC CATHETERIZATION    . CAROTID ENDARTERECTOMY     left 02/2001       Inpatient Medications: Scheduled Meds:  Continuous Infusions:  PRN Meds:   Allergies:   No Known Allergies  Social History:   Social History   Socioeconomic History  . Marital status: Married    Spouse name: Not on file  . Number of children: Not on file  . Years of education: Not on file  . Highest education level: Not on file  Social Needs  . Financial resource strain: Not on file  . Food insecurity - worry: Not on file  . Food insecurity - inability: Not on file  . Transportation needs - medical: Not on file  . Transportation needs - non-medical: Not on file  Occupational History  . Occupation: RETIRED    Employer: RETIRED    Comment: [employer]  Tobacco Use  . Smoking status: Never Smoker  . Smokeless tobacco: Never Used  Substance and Sexual Activity  . Alcohol use: No  . Drug use: No  . Sexual activity: No  Other Topics Concern  . Not on file  Social History Narrative   Lives at home with husband.      Family History:   No premature CAD.  Family History  Problem Relation Age of Onset  . Heart failure Mother 76  . Rheum arthritis Father        prostate  . Prostate cancer Unknown   . Coronary artery disease Brother 24       CABG, alive at 44  . Coronary artery disease Brother 26       Died  . Heart failure Sister   . Heart failure Sister      ROS:  Please see the history of present illness.   All other ROS reviewed and negative.     Physical Exam/Data:   Vitals:   10/26/17 1330 10/26/17  1335 10/26/17 1400 10/26/17 1430  BP: 124/73 (!) 134/55 138/61 138/60  Pulse: 71 82 72 75  Resp: 19 (!) 21 (!) 22 (!) 27  Temp:      TempSrc:      SpO2: 97% 97% 97% 97%  Weight:      Height:       No intake or output data in the 24 hours ending 10/26/17 1506 Filed Weights   10/26/17 1127  Weight: 120 lb (54.4 kg)   Body mass index is 20.6 kg/m.  General:  Well nourished, well developed, elderly woman in no acute distress HEENT: normal Lymph: no adenopathy Neck: no JVD Endocrine:  No thryomegaly Vascular: No carotid bruits  Cardiac:  normal S1, S2; RRR; no murmur  Lungs:  clear to  auscultation bilaterally, no wheezing, rhonchi or rales  Abd: soft, nontender, no hepatomegaly  Ext: no edema Musculoskeletal:  No deformities, BUE and BLE strength normal and equal Skin: warm and dry  Neuro:  CNs 2-12 intact, no focal abnormalities noted Psych:  Normal affect   EKG:  The EKG was personally reviewed and demonstrates: See above Telemetry:  Telemetry was personally reviewed and demonstrates: Sinus rhythm  Relevant CV Studies: See cardiac catheterization report above  Laboratory Data:  Chemistry Recent Labs  Lab 10/26/17 1203  NA 132*  K 4.8  CL 101  CO2 22  GLUCOSE 90  BUN 17  CREATININE 0.90  CALCIUM 8.3*  GFRNONAA 52*  GFRAA >60  ANIONGAP 9    No results for input(s): PROT, ALBUMIN, AST, ALT, ALKPHOS, BILITOT in the last 168 hours. Hematology Recent Labs  Lab 10/26/17 1203  WBC 8.2  RBC 4.35  HGB 11.3*  HCT 36.3  MCV 83.4  MCH 26.0  MCHC 31.1  RDW 15.2  PLT 234   Cardiac Enzymes Recent Labs  Lab 10/26/17 1203  TROPONINI 0.32*   No results for input(s): TROPIPOC in the last 168 hours.  BNPNo results for input(s): BNP, PROBNP in the last 168 hours.  DDimer No results for input(s): DDIMER in the last 168 hours.  Radiology/Studies:  Dg Chest 2 View  Result Date: 10/26/2017 CLINICAL DATA:  Chest pain and fever EXAM: CHEST  2 VIEW COMPARISON:   June 30, 2017 FINDINGS: There are small pleural effusions bilaterally with bibasilar atelectatic change. There is no frank edema or consolidation. Heart is borderline enlarged with pulmonary vascularity within normal limits. No adenopathy. There is aortic atherosclerosis. There is degenerative change in the thoracic spine. No pneumothorax. IMPRESSION: Small bilateral pleural effusions with bibasilar atelectasis. No frank edema or consolidation. Stable cardiac prominence. There is aortic atherosclerosis. Aortic Atherosclerosis (ICD10-I70.0). Electronically Signed   By: Lowella Grip III M.D.   On: 10/26/2017 13:21    Assessment and Plan:   1.  Chest pain in the context of known coronary artery disease with prior non-STEMI and multivessel percutaneous coronary intervention with elevated troponins consistent with non-STEMI: Symptoms have essentially resolved with nitroglycerin ointment.  Aspirin, Plavix, Imdur 30 mg, and carvedilol.  Given her advanced age and high risk for complications if she were to undergo coronary angiography with percutaneous coronary intervention, I will elect to treat her medically.  I will start IV heparin.  I discussed this with the patient and her daughter and they are in agreement with this plan.  2.  Chronic systolic heart failure: She has small bilateral pleural effusions on chest x-ray.  There is no jugular venous distention and she is not orthopneic.  She appears compensated.  Resume Lasix 40 mg daily along with carvedilol and long-acting nitrates.  An echocardiogram has been ordered by internal medicine.  I will review this.  3.  Hypertension: Blood pressure is reasonably controlled.  I will continue to monitor this.  4.  Hyperlipidemia: It appears she was on Lipitor 10 mg when she was last evaluated in the cardiology office, but I do not see this any longer on her home medication regimen.  I will resume.    5.  Status post left carotid endarterectomy: Continue  aspirin.   For questions or updates, please contact [place] Please consult [email or web address] for contact info under Cardiology/STEMI.   Signed, Kate Sable, MD  10/26/2017 3:06 PM

## 2017-10-26 NOTE — ED Provider Notes (Signed)
[facility] EMERGENCY DEPARTMENT Provider Note   CSN: [id number] Arrival date & time: 10/26/17  1117     History   Chief Complaint Chief Complaint  Patient presents with  . Chest Pain    HPI Zamarah T Cuoco is a 84 y.o. female.  Pt presents to the ED today with CP.  She had it yesterday morning, took 1 nitro and it went away.  She had it again this morning, but needed 2 nitro.  The pt said she had sob associated with cp.  She feels fine now.  The pt has a hx of CAD which is being medically managed.  She is followed by Dr. Percival Spanish and last saw him in December.      Past Medical History:  Diagnosis Date  . Arthritis   . CAD (coronary artery disease)    a. NSTEMI in 2011 s/p PCI/DES to distal Cx, s/p PCI/DES to OM2 (EF 40%). b. NSTEMI 02/2012 treated conservatively.  . Carotid artery stenosis    Left carotid endarterectomy 02/2001  . CHF (congestive heart failure) ([place])    a. EF 40% in 2011. b. EF 35-40% by echo 03/12/12.  [name] Kitchen Hyperlipidemia   . Hypertension   . Hyponatremia    Noted on 02/2012 admission  . Pneumonia    02/2012  . Renal insufficiency    Noted on 02/2012 admission  . Respiratory failure ([place])    02/2012 - requiring bipap - multifactorial in setting of PNA/CHF/NSTEMI  . Thyroid disease    Euthyroid 02/2012    Patient Active Problem List   Diagnosis Date Noted  . Pressure injury of skin 07/02/2017  . Unstable angina ([place]) 06/30/2017  . Hypothyroidism 08/03/2016  . Venous (peripheral) insufficiency 03/19/2016  . Vertebrobasilar insufficiency 10/08/2015  . Cervical spondylosis without myelopathy 10/08/2015  . Carotid sinus syncope 10/08/2015  . History of Clostridium difficile 10/01/2014  . Hemorrhoids 06/11/2014  . Ileus ([place]) 08/08/2013  . Spinal stenosis of lumbar region 05/26/2013  . Chronic systolic heart failure ([place]) 03/21/2012  . Ischemic cardiomyopathy 03/21/2012  . CKD (chronic kidney disease) 03/21/2012  . NSTEMI (non-ST elevated myocardial  infarction) ([place]) 03/12/2012  . Dyslipidemia 12/10/2010  . CORONARY ATHEROSCLEROSIS NATIVE CORONARY ARTERY 08/27/2010  . Accelerated hypertension 08/26/2010  . CAROTID STENOSIS 08/26/2010    Past Surgical History:  Procedure Laterality Date  . CARDIAC CATHETERIZATION    . CAROTID ENDARTERECTOMY     left 02/2001    OB History    Gravida Para Term Preterm AB Living   2 2 2     2    SAB TAB Ectopic Multiple Live Births                   Home Medications    Prior to Admission medications   Medication Sig Start Date End Date Taking? Authorizing Provider  aspirin 81 MG tablet Take 81 mg by mouth daily.     Yes [provider]  carvedilol (COREG) 3.125 MG tablet Take 1 tablet (3.125 mg total) by mouth 2 (two) times daily with a meal. 07/02/17  Yes Isaiah Serge, NP  clopidogrel (PLAVIX) 75 MG tablet Take 1 Tablet by mouth once daily 07/09/17  Yes Eustaquio Maize, MD  furosemide (LASIX) 40 MG tablet TAKE 1 TABLET BY MOUTH ONCE DAILY 08/06/17  Yes Minus Breeding, MD  isosorbide mononitrate (IMDUR) 30 MG 24 hr tablet Take 1 tablet (30 mg total) by mouth daily. 07/03/17  Yes Strader, [place], PA-C  ketoconazole (  NIZORAL) 2 % cream Apply topically daily behind ears when sore, as directed 04/22/17  Yes Eustaquio Maize, MD  levothyroxine (SYNTHROID, LEVOTHROID) 50 MCG tablet Take 1 Tablet by mouth once daily before breakfast 09/08/17  Yes Eustaquio Maize, MD  nitroGLYCERIN (NITROSTAT) 0.4 MG SL tablet Place 1 tablet (0.4 mg total) under the tongue every 5 (five) minutes as needed for chest pain. 08/25/17  Yes Minus Breeding, MD  triamcinolone (KENALOG) 0.025 % ointment Apply 1 application topically 2 (two) times daily. 02/01/17  Yes Eustaquio Maize, MD    Family History Family History  Problem Relation Age of Onset  . Heart failure Mother 42  . Rheum arthritis Father        prostate  . Prostate cancer Unknown   . Coronary artery disease Brother 40       CABG, alive at 57   . Coronary artery disease Brother 52       Died  . Heart failure Sister   . Heart failure Sister     Social History Social History   Tobacco Use  . Smoking status: Never Smoker  . Smokeless tobacco: Never Used  Substance Use Topics  . Alcohol use: No  . Drug use: No     Allergies   Patient has no known allergies.   Review of Systems Review of Systems  Respiratory: Positive for shortness of breath.   Cardiovascular: Positive for chest pain.  All other systems reviewed and are negative.    Physical Exam Updated Vital Signs BP 127/60   Pulse 71   Temp 98.1 F (36.7 C) (Oral)   Resp 13   Ht 5\' 4"  (1.626 m)   Wt 54.4 kg (120 lb)   SpO2 94%   BMI 20.60 kg/m   Physical Exam  Constitutional: She is oriented to person, place, and time. She appears well-developed and well-nourished.  HENT:  Head: Normocephalic and atraumatic.  Eyes: EOM are normal. Pupils are equal, round, and reactive to light.  Neck: Normal range of motion. Neck supple.  Cardiovascular: Normal rate, regular rhythm, intact distal pulses and normal pulses.  Pulmonary/Chest: Effort normal and breath sounds normal.  Abdominal: Soft. Bowel sounds are normal.  Musculoskeletal: Normal range of motion.       Right lower leg: Normal.       Left lower leg: Normal.  Neurological: She is alert and oriented to person, place, and time.  Skin: Skin is warm and dry. Capillary refill takes less than 2 seconds.  Psychiatric: She has a normal mood and affect. Her behavior is normal.  Nursing note and vitals reviewed.    ED Treatments / Results  Labs (all labs ordered are listed, but only abnormal results are displayed) Labs Reviewed  BASIC METABOLIC PANEL - Abnormal; Notable for the following components:      Result Value   Sodium 132 (*)    Calcium 8.3 (*)    GFR calc non Af Amer 52 (*)    All other components within normal limits  CBC - Abnormal; Notable for the following components:   Hemoglobin  11.3 (*)    All other components within normal limits  TROPONIN I - Abnormal; Notable for the following components:   Troponin I 0.32 (*)    All other components within normal limits    EKG  EKG Interpretation  Date/Time:  Tuesday October 26 2017 11:22:08 EST Ventricular Rate:  91 PR Interval:  214 QRS Duration: 96 QT Interval:  386  QTC Calculation: 474 R Axis:   7 Text Interpretation:  Sinus rhythm with 1st degree A-V block Anteroseptal infarct , age undetermined ST & T wave abnormality, consider lateral ischemia Abnormal ECG similar to previous from 06/2017 Confirmed by Virgel Manifold [phone]) on 10/26/2017 11:42:55 AM       Radiology Dg Chest 2 View  Result Date: 10/26/2017 CLINICAL DATA:  Chest pain and fever EXAM: CHEST  2 VIEW COMPARISON:  June 30, 2017 FINDINGS: There are small pleural effusions bilaterally with bibasilar atelectatic change. There is no frank edema or consolidation. Heart is borderline enlarged with pulmonary vascularity within normal limits. No adenopathy. There is aortic atherosclerosis. There is degenerative change in the thoracic spine. No pneumothorax. IMPRESSION: Small bilateral pleural effusions with bibasilar atelectasis. No frank edema or consolidation. Stable cardiac prominence. There is aortic atherosclerosis. Aortic Atherosclerosis (ICD10-I70.0). Electronically Signed   By: Lowella Grip III M.D.   On: 10/26/2017 13:21    Procedures Procedures (including critical care time)  Medications Ordered in ED Medications - No data to display   Initial Impression / Assessment and Plan / ED Course  I have reviewed the triage vital signs and the nursing notes.  Pertinent labs & imaging results that were available during my care of the patient were reviewed by me and considered in my medical decision making (see chart for details).    Pt d/w Dr. Bronson Ing (cardiology) who feels that pt can stay here.  He does not want to start heparin drip now.  Pt  d/w hospitalist for admission.  Final Clinical Impressions(s) / ED Diagnoses   Final diagnoses:  NSTEMI (non-ST elevated myocardial infarction) [facility])    ED Discharge Orders    None       Isla Pence, MD 10/27/17 1515

## 2017-10-26 NOTE — ED Notes (Signed)
Back from xray

## 2017-10-26 NOTE — ED Notes (Signed)
Called [facility] for Lipitor, not stocked in ED

## 2017-10-26 NOTE — H&P (Signed)
History and Physical  Lori [name]:[id number] DOB: [birth year] DOA: 10/26/2017   PCP: Eustaquio Maize, MD   Patient coming from: Home  Chief Complaint: chest pain  HPI:  Lori [name] is a 84 y.o. female with medical history of coronary artery disease, hypertension, CKD stage III, hyperlipidemia presenting with chest pain times 2 days.  The patient complains of substernal chest pain each of the last 2 mornings after she has woken up and walked back from the bathroom.  On 10/25/2017, the patient took 1 nitroglycerin with relief of her chest discomfort.  She went about her normal [name] activities including her usual house chores without any further chest discomfort or shortness of breath.  On the morning of 10/26/2017, the patient experienced substernal chest pain once again which was relieved with 2 nitroglycerin.  She states that she usually takes approximately 2 nitroglycerin per week for chest pain.  However, her chest discomfort on 10/25/2017 and 10/26/17 or greater in intensity and accompanied by some dizziness, shortness of breath, and nausea.  As result, the patient presented for further evaluation. In the emergency department, the patient was afebrile hemodynamically stable saturating 98% on room air.  She was chest pain-free.  BMP, CBC were unremarkable.  Initial troponin was 0.32.  EKG shows sinus rhythm with T wave inversion V4-V6, I, II, aVL.  Cardiology was consulted by EDP.  Chest x-ray showed small bilateral pleural effusions  Assessment/Plan: Stable angina/elevated troponin -Presently chest pain-free -Continue to cycle troponins -Cardiology consult -Echocardiogram -Continue aspirin and Plavix -Continue carvedilol and Imdur  Ischemic cardiomyopathy/chronic systolic and diastolic CHF -She is clinically euvolemic -Continue Lasix and carvedilol -03/12/2012 echo EF 35-40%, grade 1 DD  Essential hypertension -Controlled -Continue Imdur and carvedilol  CKD stage  III -Baseline creatinine 0.8-1.0 -A.m. BMP  Hyperlipidemia -Continue statin  Hypothyroidism -Continue Synthroid       Past Medical History:  Diagnosis Date  . Arthritis   . CAD (coronary artery disease)    a. NSTEMI in 2011 s/p PCI/DES to distal Cx, s/p PCI/DES to OM2 (EF 40%). b. NSTEMI 02/2012 treated conservatively.  . Carotid artery stenosis    Left carotid endarterectomy 02/2001  . CHF (congestive heart failure) ([place])    a. EF 40% in 2011. b. EF 35-40% by echo 03/12/12.  [name] Kitchen Hyperlipidemia   . Hypertension   . Hyponatremia    Noted on 02/2012 admission  . Pneumonia    02/2012  . Renal insufficiency    Noted on 02/2012 admission  . Respiratory failure ([place])    02/2012 - requiring bipap - multifactorial in setting of PNA/CHF/NSTEMI  . Thyroid disease    Euthyroid 02/2012   Past Surgical History:  Procedure Laterality Date  . CARDIAC CATHETERIZATION    . CAROTID ENDARTERECTOMY     left 02/2001   Social History:  reports that  has never smoked. she has never used smokeless tobacco. She reports that she does not drink alcohol or use drugs.   Family History  Problem Relation Age of Onset  . Heart failure Mother 40  . Rheum arthritis Father        prostate  . Prostate cancer Unknown   . Coronary artery disease Brother 20       CABG, alive at 61  . Coronary artery disease Brother 47       Died  . Heart failure Sister   . Heart failure Sister      No Known Allergies  Prior to Admission medications   Medication Sig Start Date End Date Taking? Authorizing Provider  aspirin 81 MG tablet Take 81 mg by mouth [name].     Yes [provider]  carvedilol (COREG) 3.125 MG tablet Take 1 tablet (3.125 mg total) by mouth 2 (two) times [name] with a meal. 07/02/17  Yes Isaiah Serge, NP  clopidogrel (PLAVIX) 75 MG tablet Take 1 Tablet by mouth once [name] 07/09/17  Yes Eustaquio Maize, MD  furosemide (LASIX) 40 MG tablet TAKE 1 TABLET BY MOUTH ONCE [name] 08/06/17   Yes Minus Breeding, MD  isosorbide mononitrate (IMDUR) 30 MG 24 hr tablet Take 1 tablet (30 mg total) by mouth [name]. 07/03/17  Yes Strader, [place], PA-C  ketoconazole (NIZORAL) 2 % cream Apply topically [name] behind ears when sore, as directed 04/22/17  Yes Eustaquio Maize, MD  levothyroxine (SYNTHROID, LEVOTHROID) 50 MCG tablet Take 1 Tablet by mouth once [name] before breakfast 09/08/17  Yes Eustaquio Maize, MD  nitroGLYCERIN (NITROSTAT) 0.4 MG SL tablet Place 1 tablet (0.4 mg total) under the tongue every 5 (five) minutes as needed for chest pain. 08/25/17  Yes Minus Breeding, MD  triamcinolone (KENALOG) 0.025 % ointment Apply 1 application topically 2 (two) times [name]. 02/01/17  Yes Eustaquio Maize, MD    Review of Systems:  Constitutional:  No weight loss, night sweats, Fevers, chills, fatigue.  Head&Eyes: No headache.  No vision loss.  No eye pain or scotoma ENT:  No Difficulty swallowing,Tooth/dental problems,Sore throat,  No ear ache, post nasal drip,  Cardio-vascular:  No , Orthopnea, PND, swelling in lower extremities,   palpitations  GI:  No  abdominal pain,vomiting, diarrhea, loss of appetite, hematochezia, melena, heartburn, indigestion, Resp:   No cough. No coughing up of blood .No wheezing.No chest wall deformity  Skin:  no rash or lesions.  GU:  no dysuria, change in color of urine, no urgency or frequency. No flank pain.  Musculoskeletal:  No joint pain or swelling. No decreased range of motion. No back pain.  Psych:  No change in mood or affect. No depression or anxiety. Neurologic: No headache, no dysesthesia, no focal weakness, no vision loss. No syncope  Physical Exam: Vitals:   10/26/17 1230 10/26/17 1300 10/26/17 1330 10/26/17 1335  BP: (!) 127/58 127/60 124/73 (!) 134/55  Pulse: 71  71 82  Resp: 14 13 19  (!) 21  Temp:      TempSrc:      SpO2: 94%  97% 97%  Weight:      Height:       General:  A&O x 3, NAD, nontoxic,  pleasant/cooperative Head/Eye: No conjunctival hemorrhage, no icterus, /AT, No nystagmus ENT:  No icterus,  No thrush, good dentition, no pharyngeal exudate Neck:  No masses, no lymphadenpathy, no bruits CV:  RRR, no rub, no gallop, no S3 Lung: Bibasilar crackles.  No wheezing.  Good air movement Abdomen: soft/NT, +BS, nondistended, no peritoneal signs Ext: No cyanosis, No rashes, No petechiae, No lymphangitis, No edema Neuro: CNII-XII intact, strength 4/5 in bilateral upper and lower extremities, no dysmetria  Labs on Admission:  Basic Metabolic Panel: Recent Labs  Lab 10/26/17 1203  NA 132*  K 4.8  CL 101  CO2 22  GLUCOSE 90  BUN 17  CREATININE 0.90  CALCIUM 8.3*   Liver Function Tests: No results for input(s): AST, ALT, ALKPHOS, BILITOT, PROT, ALBUMIN in the last 168 hours. No results for input(s): LIPASE, AMYLASE in the  last 168 hours. No results for input(s): AMMONIA in the last 168 hours. CBC: Recent Labs  Lab 10/26/17 1203  WBC 8.2  HGB 11.3*  HCT 36.3  MCV 83.4  PLT 234   Coagulation Profile: No results for input(s): INR, PROTIME in the last 168 hours. Cardiac Enzymes: Recent Labs  Lab 10/26/17 1203  TROPONINI 0.32*   BNP: Invalid input(s): POCBNP CBG: No results for input(s): GLUCAP in the last 168 hours. Urine analysis:    Component Value Date/Time   COLORURINE YELLOW 10/25/2016 1847   APPEARANCEUR HAZY (A) 10/25/2016 1847   LABSPEC 1.009 10/25/2016 1847   PHURINE 6.0 10/25/2016 1847   GLUCOSEU NEGATIVE 10/25/2016 1847   HGBUR SMALL (A) 10/25/2016 1847   BILIRUBINUR NEGATIVE 10/25/2016 1847   BILIRUBINUR negative 10/08/2015 1725   KETONESUR NEGATIVE 10/25/2016 1847   PROTEINUR NEGATIVE 10/25/2016 1847   UROBILINOGEN negative 10/08/2015 1725   UROBILINOGEN 0.2 05/25/2013 1525   NITRITE NEGATIVE 10/25/2016 1847   LEUKOCYTESUR SMALL (A) 10/25/2016 1847   Sepsis Labs: @LABRCNTIP (procalcitonin:4,lacticidven:4) )No results found for this or  any previous visit (from the past 240 hour(s)).   Radiological Exams on Admission: Dg Chest 2 View  Result Date: 10/26/2017 CLINICAL DATA:  Chest pain and fever EXAM: CHEST  2 VIEW COMPARISON:  June 30, 2017 FINDINGS: There are small pleural effusions bilaterally with bibasilar atelectatic change. There is no frank edema or consolidation. Heart is borderline enlarged with pulmonary vascularity within normal limits. No adenopathy. There is aortic atherosclerosis. There is degenerative change in the thoracic spine. No pneumothorax. IMPRESSION: Small bilateral pleural effusions with bibasilar atelectasis. No frank edema or consolidation. Stable cardiac prominence. There is aortic atherosclerosis. Aortic Atherosclerosis (ICD10-I70.0). Electronically Signed   By: Lowella Grip III M.D.   On: 10/26/2017 13:21    EKG: Independently reviewed. Sinus, TWI V4-V6, I, II, aVL    Time spent:60 minutes Code Status:  FULL Family Communication:  Daughter updated at bedside Disposition Plan: expect 1-2 day hospitalization Consults called: cardiology DVT Prophylaxis: Caroleen Lovenox  Orson Eva, DO  [facility] Pager [phone]  If 7PM-7AM, please contact night-coverage [email or web address] Password [email or web address] 10/26/2017, 2:09 PM

## 2017-10-26 NOTE — ED Notes (Signed)
Ok to feed cardiac diet per EDP

## 2017-10-26 NOTE — ED Notes (Signed)
Report given take pt to floor after 7;30

## 2017-10-27 ENCOUNTER — Other Ambulatory Visit: Payer: Self-pay

## 2017-10-27 ENCOUNTER — Inpatient Hospital Stay (HOSPITAL_COMMUNITY): Payer: Medicare Other

## 2017-10-27 DIAGNOSIS — I10 Essential (primary) hypertension: Secondary | ICD-10-CM | POA: Diagnosis not present

## 2017-10-27 DIAGNOSIS — Z7982 Long term (current) use of aspirin: Secondary | ICD-10-CM | POA: Diagnosis not present

## 2017-10-27 DIAGNOSIS — I340 Nonrheumatic mitral (valve) insufficiency: Secondary | ICD-10-CM | POA: Diagnosis not present

## 2017-10-27 DIAGNOSIS — N183 Chronic kidney disease, stage 3 (moderate): Secondary | ICD-10-CM

## 2017-10-27 DIAGNOSIS — I255 Ischemic cardiomyopathy: Secondary | ICD-10-CM | POA: Diagnosis present

## 2017-10-27 DIAGNOSIS — I5022 Chronic systolic (congestive) heart failure: Secondary | ICD-10-CM

## 2017-10-27 DIAGNOSIS — Z955 Presence of coronary angioplasty implant and graft: Secondary | ICD-10-CM | POA: Diagnosis not present

## 2017-10-27 DIAGNOSIS — I5043 Acute on chronic combined systolic (congestive) and diastolic (congestive) heart failure: Secondary | ICD-10-CM | POA: Diagnosis not present

## 2017-10-27 DIAGNOSIS — I209 Angina pectoris, unspecified: Secondary | ICD-10-CM | POA: Diagnosis not present

## 2017-10-27 DIAGNOSIS — I213 ST elevation (STEMI) myocardial infarction of unspecified site: Secondary | ICD-10-CM | POA: Diagnosis present

## 2017-10-27 DIAGNOSIS — R0602 Shortness of breath: Secondary | ICD-10-CM

## 2017-10-27 DIAGNOSIS — I5042 Chronic combined systolic (congestive) and diastolic (congestive) heart failure: Secondary | ICD-10-CM | POA: Diagnosis not present

## 2017-10-27 DIAGNOSIS — Z79899 Other long term (current) drug therapy: Secondary | ICD-10-CM | POA: Diagnosis not present

## 2017-10-27 DIAGNOSIS — I25118 Atherosclerotic heart disease of native coronary artery with other forms of angina pectoris: Secondary | ICD-10-CM | POA: Diagnosis not present

## 2017-10-27 DIAGNOSIS — I130 Hypertensive heart and chronic kidney disease with heart failure and stage 1 through stage 4 chronic kidney disease, or unspecified chronic kidney disease: Secondary | ICD-10-CM | POA: Diagnosis present

## 2017-10-27 DIAGNOSIS — I214 Non-ST elevation (NSTEMI) myocardial infarction: Secondary | ICD-10-CM | POA: Diagnosis not present

## 2017-10-27 DIAGNOSIS — J9811 Atelectasis: Secondary | ICD-10-CM | POA: Diagnosis present

## 2017-10-27 DIAGNOSIS — E039 Hypothyroidism, unspecified: Secondary | ICD-10-CM | POA: Diagnosis not present

## 2017-10-27 DIAGNOSIS — Z7902 Long term (current) use of antithrombotics/antiplatelets: Secondary | ICD-10-CM | POA: Diagnosis not present

## 2017-10-27 DIAGNOSIS — E7849 Other hyperlipidemia: Secondary | ICD-10-CM | POA: Diagnosis not present

## 2017-10-27 DIAGNOSIS — I252 Old myocardial infarction: Secondary | ICD-10-CM | POA: Diagnosis not present

## 2017-10-27 DIAGNOSIS — E782 Mixed hyperlipidemia: Secondary | ICD-10-CM | POA: Diagnosis present

## 2017-10-27 LAB — BASIC METABOLIC PANEL
Anion gap: 10 (ref 5–15)
BUN: 18 mg/dL (ref 6–20)
CALCIUM: 8.4 mg/dL — AB (ref 8.9–10.3)
CO2: 23 mmol/L (ref 22–32)
CREATININE: 0.97 mg/dL (ref 0.44–1.00)
Chloride: 99 mmol/L — ABNORMAL LOW (ref 101–111)
GFR, EST AFRICAN AMERICAN: 55 mL/min — AB (ref >60)
GFR, EST NON AFRICAN AMERICAN: 48 mL/min — AB (ref >60)
Glucose, Bld: 90 mg/dL (ref 65–99)
Potassium: 4.2 mmol/L (ref 3.5–5.1)
SODIUM: 132 mmol/L — AB (ref 135–145)

## 2017-10-27 LAB — ECHOCARDIOGRAM COMPLETE
AO mean calculated velocity dopler: 123 cm/s
AOPV: 0.34 m/s
AV Area VTI index: 0.61 cm2/m2
AV Area VTI: 0.96 cm2
AV Area mean vel: 1.11 cm2
AV area mean vel ind: 0.71 cm2/m2
AV peak Index: 0.61
AV pk vel: 195 cm/s
AV vel: 0.95
AVCELMEANRAT: 0.39
AVG: 8 mmHg
AVPG: 15 mmHg
CHL CUP AV VALUE AREA INDEX: 0.61
CHL CUP RV SYS PRESS: 43 mmHg
E decel time: 183 msec
E/e' ratio: 17.96
FS: 16 % — AB (ref 28–44)
HEIGHTINCHES: 64 in
IV/PV OW: 1.05
LA vol A4C: 79.1 ml
LADIAMINDEX: 2.3 cm/m2
LASIZE: 36 mm
LEFT ATRIUM END SYS DIAM: 36 mm
LV SIMPSON'S DISK: 32
LV TDI E'MEDIAL: 4.57
LV e' LATERAL: 6.96 cm/s
LVDIAVOL: 98 mL (ref 46–106)
LVDIAVOLIN: 62 mL/m2
LVEEAVG: 17.96
LVEEMED: 17.96
LVOT SV: 44 mL
LVOT VTI: 15.5 cm
LVOT area: 2.84 cm2
LVOT diameter: 19 mm
LVOT peak VTI: 0.33 cm
LVOT peak grad rest: 2 mmHg
LVOT peak vel: 66.1 cm/s
LVSYSVOL: 67 mL — AB
LVSYSVOLIN: 43 mL/m2
Lateral S' vel: 9.36 cm/s
MV Dec: 183
MV Peak grad: 6 mmHg
MV pk E vel: 125 m/s
MVPKAVEL: 126 m/s
P 1/2 time: 254 ms
PW: 11.1 mm — AB (ref 0.6–1.1)
Reg peak vel: 316 cm/s
Stroke v: 31 ml
TAPSE: 15.9 mm
TDI e' lateral: 6.96
TRMAXVEL: 316 cm/s
VTI: 205 cm
VTI: 46.4 cm
Valve area: 0.95 cm2
WEIGHTICAEL: 1920 [oz_av]

## 2017-10-27 LAB — CBC
HCT: 35.1 % — ABNORMAL LOW (ref 36.0–46.0)
Hemoglobin: 11 g/dL — ABNORMAL LOW (ref 12.0–15.0)
MCH: 26.1 pg (ref 26.0–34.0)
MCHC: 31.3 g/dL (ref 30.0–36.0)
MCV: 83.4 fL (ref 78.0–100.0)
Platelets: 206 10*3/uL (ref 150–400)
RBC: 4.21 MIL/uL (ref 3.87–5.11)
RDW: 15.3 % (ref 11.5–15.5)
WBC: 6.5 10*3/uL (ref 4.0–10.5)

## 2017-10-27 LAB — HEPARIN LEVEL (UNFRACTIONATED)
HEPARIN UNFRACTIONATED: 0.18 [IU]/mL — AB (ref 0.30–0.70)
Heparin Unfractionated: 0.68 IU/mL (ref 0.30–0.70)

## 2017-10-27 LAB — TROPONIN I: TROPONIN I: 0.81 ng/mL — AB (ref <0.03)

## 2017-10-27 MED ORDER — ISOSORBIDE MONONITRATE ER 60 MG PO TB24
60.0000 mg | ORAL_TABLET | Freq: Every day | ORAL | Status: DC
  Administered 2017-10-28: 60 mg via ORAL
  Filled 2017-10-27: qty 1

## 2017-10-27 MED ORDER — POTASSIUM CHLORIDE 20 MEQ PO PACK: 20.0000 meq | PACK | Freq: Once | ORAL | Status: DC

## 2017-10-27 MED ORDER — FUROSEMIDE 10 MG/ML IJ SOLN: 40.0000 mg | Freq: Once | INTRAMUSCULAR | Status: DC

## 2017-10-27 MED ORDER — HEPARIN BOLUS VIA INFUSION
1600.0000 [IU] | Freq: Once | INTRAVENOUS | Status: AC
  Administered 2017-10-27: 1600 [IU] via INTRAVENOUS
  Filled 2017-10-27: qty 1600

## 2017-10-27 MED ORDER — ISOSORBIDE MONONITRATE ER 60 MG PO TB24
30.0000 mg | ORAL_TABLET | Freq: Once | ORAL | Status: AC
  Administered 2017-10-27: 30 mg via ORAL
  Filled 2017-10-27: qty 1

## 2017-10-27 NOTE — Progress Notes (Signed)
ANTICOAGULATION CONSULT NOTE - Follow Up Consult  Pharmacy Consult for heparin Indication: STEMI  No Known Allergies  Patient Measurements: Height: 5\' 4"  (162.6 cm) Weight: 120 lb (54.4 kg) IBW/kg (Calculated) : 54.7 Heparin Dosing Weight: 54.5 kg  Vital Signs: Temp: 97.8 F (36.6 C) (02/06 0635) Temp Source: Oral (02/06 0635) BP: 142/58 (02/06 0635) Pulse Rate: 74 (02/06 0635)  Labs: Recent Labs    10/26/17 1203 10/26/17 2000 10/27/17 0134 10/27/17 0552 10/27/17 1610  HGB 11.3*  --   --  11.0*  --   HCT 36.3  --   --  35.1*  --   PLT 234  --   --  206  --   APTT 146*  --   --   --   --   LABPROT 14.9  --   --   --   --   INR 1.18  --   --   --   --   HEPARINUNFRC  --   --   --  0.18* 0.68  CREATININE 0.90  --   --  0.97  --   TROPONINI 0.32* 1.04* 0.81*  --   --     Estimated Creatinine Clearance: 29.1 mL/min (by C-G formula based on SCr of 0.97 mg/dL).   Medications:  Medications Prior to Admission  Medication Sig Dispense Refill Last Dose  . aspirin 81 MG tablet Take 81 mg by mouth daily.     10/26/2017 at 0800  . carvedilol (COREG) 3.125 MG tablet Take 1 tablet (3.125 mg total) by mouth 2 (two) times daily with a meal. 180 tablet 0 10/26/2017 at 0800  . clopidogrel (PLAVIX) 75 MG tablet Take 1 Tablet by mouth once daily 90 tablet 0 10/25/2017 at 1800  . furosemide (LASIX) 40 MG tablet TAKE 1 TABLET BY MOUTH ONCE DAILY 90 tablet 0 10/25/2017 at Unknown time  . isosorbide mononitrate (IMDUR) 30 MG 24 hr tablet Take 1 tablet (30 mg total) by mouth daily. 90 tablet 3 10/26/2017 at 0800  . ketoconazole (NIZORAL) 2 % cream Apply topically daily behind ears when sore, as directed 15 g 0 Taking  . levothyroxine (SYNTHROID, LEVOTHROID) 50 MCG tablet Take 1 Tablet by mouth once daily before breakfast 90 tablet 1 10/26/2017 at 0800  . nitroGLYCERIN (NITROSTAT) 0.4 MG SL tablet Place 1 tablet (0.4 mg total) under the tongue every 5 (five) minutes as needed for chest pain. 25 tablet 6  10/26/2017 at 0730  . triamcinolone (KENALOG) 0.025 % ointment Apply 1 application topically 2 (two) times daily. 30 g 0 Taking    Assessment: Pharmacy consulted to dose heparin for patient with chest pain and elevated troponin.  Goal of Therapy:  Heparin level 0.3-0.7 units/ml Monitor platelets by anticoagulation protocol: Yes   Plan:  Continue heparin infusion to 850 units/hr Check anti-Xa level daily while on heparin Continue to monitor H&H and platelets    Margot Ables, PharmD Clinical Pharmacist 10/27/2017 4:47 PM

## 2017-10-27 NOTE — Progress Notes (Addendum)
Progress Note  Patient Name: Lori [name] Date of Encounter: 10/27/2017  Primary Cardiologist: Dr. Percival Spanish  Subjective   Had an episode of chest pain and shortness of breath at 5 am. Now has residual chest pressure.  Inpatient Medications    Scheduled Meds: . aspirin  81 mg Oral Daily  . atorvastatin  10 mg Oral q1800  . carvedilol  3.125 mg Oral BID WC  . clopidogrel  75 mg Oral Daily  . furosemide  40 mg Intravenous Once  . furosemide  40 mg Oral Daily  . isosorbide mononitrate  30 mg Oral Daily  . levothyroxine  50 mcg Oral QAC breakfast   Continuous Infusions: . heparin 850 Units/hr (10/27/17 0833)   PRN Meds: acetaminophen **OR** acetaminophen, nitroGLYCERIN, ondansetron **OR** ondansetron (ZOFRAN) IV   Vital Signs    Vitals:   10/26/17 1830 10/26/17 1942 10/26/17 2121 10/27/17 0635  BP: (!) 142/65 (!) 139/55  (!) 142/58  Pulse: 71 79  74  Resp: (!) 25 20  18   Temp:    97.8 F (36.6 C)  TempSrc:    Oral  SpO2: 97% 99% 96% 97%  Weight:      Height:        Intake/Output Summary (Last 24 hours) at 10/27/2017 0846 Last data filed at 10/27/2017 0500 Gross per 24 hour  Intake 324.28 ml  Output -  Net 324.28 ml   Filed Weights   10/26/17 1127  Weight: 120 lb (54.4 kg)    Telemetry    Sinus with occasional PVC's - Personally Reviewed  ECG    n/a - Personally Reviewed  Physical Exam   GEN: No acute distress.   Neck: No JVD Cardiac: RRR, no murmurs, rubs, or gallops.  Respiratory: Bibasilar crackles, no wheezes. GI: Soft, nontender, non-distended  MS: No edema; No deformity. Neuro:  Nonfocal  Psych: Normal affect   Labs    Chemistry Recent Labs  Lab 10/26/17 1203 10/27/17 0552  NA 132* 132*  K 4.8 4.2  CL 101 99*  CO2 22 23  GLUCOSE 90 90  BUN 17 18  CREATININE 0.90 0.97  CALCIUM 8.3* 8.4*  GFRNONAA 52* 48*  GFRAA >60 55*  ANIONGAP 9 10     Hematology Recent Labs  Lab 10/26/17 1203 10/27/17 0552  WBC 8.2 6.5  RBC 4.35  4.21  HGB 11.3* 11.0*  HCT 36.3 35.1*  MCV 83.4 83.4  MCH 26.0 26.1  MCHC 31.1 31.3  RDW 15.2 15.3  PLT 234 206    Cardiac Enzymes Recent Labs  Lab 10/26/17 1203 10/26/17 2000 10/27/17 0134  TROPONINI 0.32* 1.04* 0.81*   No results for input(s): TROPIPOC in the last 168 hours.   BNPNo results for input(s): BNP, PROBNP in the last 168 hours.   DDimer No results for input(s): DDIMER in the last 168 hours.   Radiology    Dg Chest 2 View  Result Date: 10/26/2017 CLINICAL DATA:  Chest pain and fever EXAM: CHEST  2 VIEW COMPARISON:  June 30, 2017 FINDINGS: There are small pleural effusions bilaterally with bibasilar atelectatic change. There is no frank edema or consolidation. Heart is borderline enlarged with pulmonary vascularity within normal limits. No adenopathy. There is aortic atherosclerosis. There is degenerative change in the thoracic spine. No pneumothorax. IMPRESSION: Small bilateral pleural effusions with bibasilar atelectasis. No frank edema or consolidation. Stable cardiac prominence. There is aortic atherosclerosis. Aortic Atherosclerosis (ICD10-I70.0). Electronically Signed   By: Lowella Grip III M.D.  On: 10/26/2017 13:21    Cardiac Studies   n/a  Patient Profile     84 y.o. female with a h/o CAD and prior NSTEMI with multivessel PCI and chronic systolic heart failure admitted with chest pain and NSTEMI.  Assessment & Plan    1.  Chest pain in the context of known coronary artery disease with prior non-STEMI and multivessel percutaneous coronary intervention with elevated troponins consistent with non-STEMI: Had symptom recurrence early this morning. Continue IV heparin, aspirin, Plavix, Imdur (I will increase to 60 mg), and carvedilol. Given her advanced age and high risk for complications if she were to undergo coronary angiography with percutaneous coronary intervention, I will continue to treat her medically.  I discussed this yesterday with the patient  and her daughter and they are in agreement with this plan.  2.  Chronic systolic heart failure: She has small bilateral pleural effusions on chest x-ray. She has some bibasilar crackles today. She just received oral Lasix 40 mg. Continue carvedilol and long-acting nitrates (I will increase Imdur to 60 mg).  An echocardiogram has been ordered by internal medicine.  I will review this.  3.  Hypertension: Blood pressure is mildly elevated this morning.  I will continue to monitor this given increased dose of Imdur as above.  4.  Hyperlipidemia: Continue Lipitor 10 mg which I restarted yesterday.  5.  Status post left carotid endarterectomy: Continue aspirin and statin.    For questions or updates, please contact [place] Please consult [email or web address] for contact info under Cardiology/STEMI.      Signed, Kate Sable, MD  10/27/2017, 8:46 AM

## 2017-10-27 NOTE — Progress Notes (Signed)
CRITICAL VALUE ALERT  Critical Value: Troponin 0.81 Date & Time Notied:  10/27/2017 0358  Provider Notified: P. Manuella Ghazi  Orders Received/Actions taken: No new orders at this time.

## 2017-10-27 NOTE — Progress Notes (Signed)
PROGRESS NOTE    Lori [name]  IRS:[id number] DOB: 14-Dec-1918 DOA: 10/26/2017 PCP: Eustaquio Maize, MD    Brief Narrative:  84 year old female with a history of coronary artery disease, hypertension, chronic kidney disease stage III, presented to the hospital with chest pain.  She was found to have elevated troponin consistent with non-ST elevation MI.  Seen by cardiology who recommended medical management.  Currently on heparin infusion, antiplatelet agents and beta-blockers.  And her dose is being adjusted.  Anticipate discharge in next 24 hours if she remains stable.   Assessment & Plan:   Active Problems:   CORONARY ATHEROSCLEROSIS NATIVE CORONARY ARTERY   NSTEMI (non-ST elevated myocardial infarction) (HCC)   Angina pectoris (HCC)   Chronic systolic CHF (congestive heart failure) ([facility])   CKD (chronic kidney disease), stage III ([facility]CC)   Chronic combined systolic and diastolic CHF (congestive heart failure) ([place])   1. Non-ST elevation MI.  Peak troponin of 1.04.  Previously had chest pain, but now appears to have resolved.  Cardiology following.  Medical management recommended at this time.  She is on heparin infusion and antiplatelet agents.  Continue on beta-blockers, aspirin, Plavix.  Imdur dose increased today to 60 mg.  Continue anticoagulation for another 24 hours. 2. Chronic systolic heart failure.  Echocardiogram repeated today with ejection fraction of 30-35%.  Continue on beta-blockers.  She is continued on Lasix.  Does not appear to be short of breath this time. 3. Hypertension.  Blood pressure is stable.  Continue current treatments. 4. Hyperlipidemia.  Continue Lipitor. 5. Chronic kidney disease stage III.  Creatinine is currently at baseline.  Continue to monitor. 6. Hypothyroidism.  Continue on Synthroid.   DVT prophylaxis: Heparin infusion Code Status: Full code Family Communication: No family present Disposition Plan: Discharge home once  improved   Consultants:   Cardiology  Procedures:  Echocardiogram: - Left ventricle: The cavity size was normal. Wall thickness was   increased in a pattern of mild LVH. Systolic function was   moderately to severely reduced. The estimated ejection fraction   was in the range of 30% to 35%. Diffuse hypokinesis. Doppler   parameters are consistent with abnormal left ventricular   relaxation (grade 1 diastolic dysfunction). Doppler parameters   are consistent with high ventricular filling pressure. - Regional wall motion abnormality: Hypokinesis of the mid   anteroseptal, mid anterolateral, apical lateral, and apical   myocardium. - Aortic valve: Trileaflet; mildly thickened, mildly calcified   leaflets. There appears to be moderate calcific aortic stenosis.   Peak velocities and mean gradients are underestimated. There was   mild regurgitation. Valve area (Vmean): 1.11 cm^2. - Mitral valve: Mildly calcified leaflets . There was mild to   moderate regurgitation. - Left atrium: The atrium was severely dilated. - Right ventricle: Systolic function was mildly reduced. - Tricuspid valve: Mildly thickened leaflets. There was moderate   regurgitation. - Pulmonic valve: There was mild regurgitation.  - Pulmonary arteries: PA peak pressure: 43 mm Hg (S).  Antimicrobials:       Subjective: No further chest pain.  No shortness of breath.  Objective: Vitals:   10/26/17 1830 10/26/17 1942 10/26/17 2121 10/27/17 0635  BP: (!) 142/65 (!) 139/55  (!) 142/58  Pulse: 71 79  74  Resp: (!) 25 20  18   Temp:    97.8 F (36.6 C)  TempSrc:    Oral  SpO2: 97% 99% 96% 97%  Weight:      Height:  Intake/Output Summary (Last 24 hours) at 10/27/2017 1713 Last data filed at 10/27/2017 1500 Gross per 24 hour  Intake 402.18 ml  Output -  Net 402.18 ml   Filed Weights   10/26/17 1127  Weight: 54.4 kg (120 lb)    Examination:  General exam: Appears calm and comfortable  Respiratory  system: Clear to auscultation. Respiratory effort normal. Cardiovascular system: S1 & S2 heard, RRR. No JVD, murmurs, rubs, gallops or clicks. No pedal edema. Gastrointestinal system: Abdomen is nondistended, soft and nontender. No organomegaly or masses felt. Normal bowel sounds heard. Central nervous system: Alert and oriented. No focal neurological deficits. Extremities: Symmetric 5 x 5 power. Skin: No rashes, lesions or ulcers Psychiatry: Judgement and insight appear normal. Mood & affect appropriate.     Data Reviewed: I have personally reviewed following labs and imaging studies  CBC: Recent Labs  Lab 10/26/17 1203 10/27/17 0552  WBC 8.2 6.5  HGB 11.3* 11.0*  HCT 36.3 35.1*  MCV 83.4 83.4  PLT 234 161   Basic Metabolic Panel: Recent Labs  Lab 10/26/17 1203 10/27/17 0552  NA 132* 132*  K 4.8 4.2  CL 101 99*  CO2 22 23  GLUCOSE 90 90  BUN 17 18  CREATININE 0.90 0.97  CALCIUM 8.3* 8.4*   GFR: Estimated Creatinine Clearance: 29.1 mL/min (by C-G formula based on SCr of 0.97 mg/dL). Liver Function Tests: No results for input(s): AST, ALT, ALKPHOS, BILITOT, PROT, ALBUMIN in the last 168 hours. No results for input(s): LIPASE, AMYLASE in the last 168 hours. No results for input(s): AMMONIA in the last 168 hours. Coagulation Profile: Recent Labs  Lab 10/26/17 1203  INR 1.18   Cardiac Enzymes: Recent Labs  Lab 10/26/17 1203 10/26/17 2000 10/27/17 0134  TROPONINI 0.32* 1.04* 0.81*   BNP (last 3 results) No results for input(s): PROBNP in the last 8760 hours. HbA1C: No results for input(s): HGBA1C in the last 72 hours. CBG: No results for input(s): GLUCAP in the last 168 hours. Lipid Profile: No results for input(s): CHOL, HDL, LDLCALC, TRIG, CHOLHDL, LDLDIRECT in the last 72 hours. Thyroid Function Tests: No results for input(s): TSH, T4TOTAL, FREET4, T3FREE, THYROIDAB in the last 72 hours. Anemia Panel: No results for input(s): VITAMINB12, FOLATE,  FERRITIN, TIBC, IRON, RETICCTPCT in the last 72 hours. Sepsis Labs: No results for input(s): PROCALCITON, LATICACIDVEN in the last 168 hours.  No results found for this or any previous visit (from the past 240 hour(s)).       Radiology Studies: Dg Chest 2 View  Result Date: 10/26/2017 CLINICAL DATA:  Chest pain and fever EXAM: CHEST  2 VIEW COMPARISON:  June 30, 2017 FINDINGS: There are small pleural effusions bilaterally with bibasilar atelectatic change. There is no frank edema or consolidation. Heart is borderline enlarged with pulmonary vascularity within normal limits. No adenopathy. There is aortic atherosclerosis. There is degenerative change in the thoracic spine. No pneumothorax. IMPRESSION: Small bilateral pleural effusions with bibasilar atelectasis. No frank edema or consolidation. Stable cardiac prominence. There is aortic atherosclerosis. Aortic Atherosclerosis (ICD10-I70.0). Electronically Signed   By: Lowella Grip III M.D.   On: 10/26/2017 13:21        Scheduled Meds: . aspirin  81 mg Oral Daily  . atorvastatin  10 mg Oral q1800  . carvedilol  3.125 mg Oral BID WC  . clopidogrel  75 mg Oral Daily  . furosemide  40 mg Oral Daily  . [START ON 10/28/2017] isosorbide mononitrate  60 mg Oral Daily  .  levothyroxine  50 mcg Oral QAC breakfast   Continuous Infusions: . heparin 850 Units/hr (10/27/17 1658)     LOS: 0 days    Time spent: 26mins    Kathie Dike, MD [facility] Pager [phone]  If 7PM-7AM, please contact night-coverage [email or web address] Password [email or web address] 10/27/2017, 5:13 PM

## 2017-10-27 NOTE — Care Management Note (Signed)
Case Management Note  Patient Details  Name: Lori [name] MRN: [id number] Date of Birth: 11-Dec-1918  Subjective/Objective:    Converted to INPT for NSTEMI                Action/Plan:   Expected Discharge Date:                  Expected Discharge Plan:     In-House Referral:     Discharge planning Services     Post Acute Care Choice:    Choice offered to:     DME Arranged:    DME Agency:     HH Arranged:    [place] Agency:     Status of Service:     If discussed at [employer], dates discussed:    Additional Comments:  Ival Bible, RN 10/27/2017, 12:21 PM

## 2017-10-27 NOTE — Progress Notes (Signed)
*  PRELIMINARY RESULTS* Echocardiogram 2D Echocardiogram has been performed.  Lori [name] 10/27/2017, 4:15 PM

## 2017-10-27 NOTE — Progress Notes (Signed)
ANTICOAGULATION CONSULT NOTE - Follow Up Consult  Pharmacy Consult for heparin Indication: STEMI  No Known Allergies  Patient Measurements: Height: 5\' 4"  (162.6 cm) Weight: 120 lb (54.4 kg) IBW/kg (Calculated) : 54.7 Heparin Dosing Weight: 54.5 kg  Vital Signs: Temp: 97.8 F (36.6 C) (02/06 0635) Temp Source: Oral (02/06 0635) BP: 142/58 (02/06 0635) Pulse Rate: 74 (02/06 0635)  Labs: Recent Labs    10/26/17 1203 10/26/17 2000 10/27/17 0134 10/27/17 0552  HGB 11.3*  --   --  11.0*  HCT 36.3  --   --  35.1*  PLT 234  --   --  206  APTT 146*  --   --   --   LABPROT 14.9  --   --   --   INR 1.18  --   --   --   HEPARINUNFRC  --   --   --  0.18*  CREATININE 0.90  --   --  0.97  TROPONINI 0.32* 1.04* 0.81*  --     Estimated Creatinine Clearance: 29.1 mL/min (by C-G formula based on SCr of 0.97 mg/dL).   Medications:  Medications Prior to Admission  Medication Sig Dispense Refill Last Dose  . aspirin 81 MG tablet Take 81 mg by mouth daily.     10/26/2017 at 0800  . carvedilol (COREG) 3.125 MG tablet Take 1 tablet (3.125 mg total) by mouth 2 (two) times daily with a meal. 180 tablet 0 10/26/2017 at 0800  . clopidogrel (PLAVIX) 75 MG tablet Take 1 Tablet by mouth once daily 90 tablet 0 10/25/2017 at 1800  . furosemide (LASIX) 40 MG tablet TAKE 1 TABLET BY MOUTH ONCE DAILY 90 tablet 0 10/25/2017 at Unknown time  . isosorbide mononitrate (IMDUR) 30 MG 24 hr tablet Take 1 tablet (30 mg total) by mouth daily. 90 tablet 3 10/26/2017 at 0800  . ketoconazole (NIZORAL) 2 % cream Apply topically daily behind ears when sore, as directed 15 g 0 Taking  . levothyroxine (SYNTHROID, LEVOTHROID) 50 MCG tablet Take 1 Tablet by mouth once daily before breakfast 90 tablet 1 10/26/2017 at 0800  . nitroGLYCERIN (NITROSTAT) 0.4 MG SL tablet Place 1 tablet (0.4 mg total) under the tongue every 5 (five) minutes as needed for chest pain. 25 tablet 6 10/26/2017 at 0730  . triamcinolone (KENALOG) 0.025 %  ointment Apply 1 application topically 2 (two) times daily. 30 g 0 Taking    Assessment: Pharmacy consulted to dose heparin for patient with chest pain and elevated troponin.  Goal of Therapy:  Heparin level 0.3-0.7 units/ml Monitor platelets by anticoagulation protocol: Yes   Plan:  Give 1600 unit bolus x 1 Increase heparin infusion to 850 units/hr Check anti-Xa level in 8 hours and daily while on heparin Continue to monitor H&H and platelets    Margot Ables, PharmD Clinical Pharmacist 10/27/2017 7:48 AM

## 2017-10-28 ENCOUNTER — Other Ambulatory Visit: Payer: Self-pay | Admitting: *Deleted

## 2017-10-28 DIAGNOSIS — I5042 Chronic combined systolic (congestive) and diastolic (congestive) heart failure: Secondary | ICD-10-CM

## 2017-10-28 DIAGNOSIS — I25118 Atherosclerotic heart disease of native coronary artery with other forms of angina pectoris: Secondary | ICD-10-CM

## 2017-10-28 LAB — BASIC METABOLIC PANEL
ANION GAP: 10 (ref 5–15)
BUN: 20 mg/dL (ref 6–20)
CO2: 24 mmol/L (ref 22–32)
Calcium: 8.3 mg/dL — ABNORMAL LOW (ref 8.9–10.3)
Chloride: 98 mmol/L — ABNORMAL LOW (ref 101–111)
Creatinine, Ser: 1.01 mg/dL — ABNORMAL HIGH (ref 0.44–1.00)
GFR calc Af Amer: 53 mL/min — ABNORMAL LOW (ref >60)
GFR calc non Af Amer: 45 mL/min — ABNORMAL LOW (ref >60)
GLUCOSE: 93 mg/dL (ref 65–99)
POTASSIUM: 3.9 mmol/L (ref 3.5–5.1)
Sodium: 132 mmol/L — ABNORMAL LOW (ref 135–145)

## 2017-10-28 LAB — CBC
HEMATOCRIT: 34.6 % — AB (ref 36.0–46.0)
HEMOGLOBIN: 11.1 g/dL — AB (ref 12.0–15.0)
MCH: 26.4 pg (ref 26.0–34.0)
MCHC: 32.1 g/dL (ref 30.0–36.0)
MCV: 82.4 fL (ref 78.0–100.0)
Platelets: 202 10*3/uL (ref 150–400)
RBC: 4.2 MIL/uL (ref 3.87–5.11)
RDW: 15.2 % (ref 11.5–15.5)
WBC: 6.3 10*3/uL (ref 4.0–10.5)

## 2017-10-28 LAB — HEPARIN LEVEL (UNFRACTIONATED): Heparin Unfractionated: 0.46 IU/mL (ref 0.30–0.70)

## 2017-10-28 MED ORDER — ATORVASTATIN CALCIUM 10 MG PO TABS: 10.0000 mg | ORAL_TABLET | Freq: Every day | ORAL | 1 refills | Status: DC

## 2017-10-28 MED ORDER — FUROSEMIDE 40 MG PO TABS: 40.0000 mg | ORAL_TABLET | Freq: Every day | ORAL | 1 refills | Status: DC

## 2017-10-28 MED ORDER — ISOSORBIDE MONONITRATE ER 60 MG PO TB24: 60.0000 mg | ORAL_TABLET | Freq: Every day | ORAL | 1 refills | Status: DC

## 2017-10-28 MED ORDER — PRAVASTATIN SODIUM 10 MG PO TABS: 10.0000 mg | ORAL_TABLET | Freq: Every evening | ORAL | 11 refills | Status: DC

## 2017-10-28 NOTE — Progress Notes (Signed)
Progress Note  Patient Name: Lori [name] Date of Encounter: 10/28/2017  Primary Cardiologist: Dr. Minus Breeding  Subjective   Reports no recurrent chest pain since yesterday, slept well.  Eating breakfast.  Inpatient Medications    Scheduled Meds: . aspirin  81 mg Oral Daily  . atorvastatin  10 mg Oral q1800  . carvedilol  3.125 mg Oral BID WC  . clopidogrel  75 mg Oral Daily  . furosemide  40 mg Oral Daily  . isosorbide mononitrate  60 mg Oral Daily  . levothyroxine  50 mcg Oral QAC breakfast   Continuous Infusions: . heparin 850 Units/hr (10/27/17 1658)   PRN Meds: acetaminophen **OR** acetaminophen, nitroGLYCERIN, ondansetron **OR** ondansetron (ZOFRAN) IV   Vital Signs    Vitals:   10/27/17 0635 10/27/17 2017 10/27/17 2229 10/28/17 0500  BP: (!) 142/58 (!) 124/49  (!) 116/46  Pulse: 74 74  74  Resp: 18     Temp: 97.8 F (36.6 C) 98.2 F (36.8 C)  98.4 F (36.9 C)  TempSrc: Oral Oral  Oral  SpO2: 97% 97% 96% 95%  Weight:    124 lb 5.4 oz (56.4 kg)  Height:        Intake/Output Summary (Last 24 hours) at 10/28/2017 0908 Last data filed at 10/27/2017 1500 Gross per 24 hour  Intake 77.9 ml  Output -  Net 77.9 ml   Filed Weights   10/26/17 1127 10/28/17 0500  Weight: 120 lb (54.4 kg) 124 lb 5.4 oz (56.4 kg)    Telemetry    Sinus rhythm.  Personally reviewed.  ECG    Tracing from 10/26/2017 showed sinus rhythm with prolonged PR interval, anteroseptal infarct pattern and nonspecific ST-T changes.  Personally reviewed.  Physical Exam   GEN:  Frail-appearing elderly woman, no acute distress.   Neck: No JVD. Cardiac: RRR, no gallop.  Respiratory: Nonlabored. Clear to auscultation bilaterally. GI: Soft, nontender, bowel sounds present. MS: No edema; No deformity. Neuro:  Nonfocal.  Decreased hearing.  Psych: Alert and oriented x 3. Normal affect.  Labs    Chemistry Recent Labs  Lab 10/26/17 1203 10/27/17 0552 10/28/17 0512  NA 132* 132*  132*  K 4.8 4.2 3.9  CL 101 99* 98*  CO2 22 23 24   GLUCOSE 90 90 93  BUN 17 18 20   CREATININE 0.90 0.97 1.01*  CALCIUM 8.3* 8.4* 8.3*  GFRNONAA 52* 48* 45*  GFRAA >60 55* 53*  ANIONGAP 9 10 10      Hematology Recent Labs  Lab 10/26/17 1203 10/27/17 0552 10/28/17 0512  WBC 8.2 6.5 6.3  RBC 4.35 4.21 4.20  HGB 11.3* 11.0* 11.1*  HCT 36.3 35.1* 34.6*  MCV 83.4 83.4 82.4  MCH 26.0 26.1 26.4  MCHC 31.1 31.3 32.1  RDW 15.2 15.3 15.2  PLT 234 206 202    Cardiac Enzymes Recent Labs  Lab 10/26/17 1203 10/26/17 2000 10/27/17 0134  TROPONINI 0.32* 1.04* 0.81*   No results for input(s): TROPIPOC in the last 168 hours.    Radiology    Dg Chest 2 View  Result Date: 10/26/2017 CLINICAL DATA:  Chest pain and fever EXAM: CHEST  2 VIEW COMPARISON:  June 30, 2017 FINDINGS: There are small pleural effusions bilaterally with bibasilar atelectatic change. There is no frank edema or consolidation. Heart is borderline enlarged with pulmonary vascularity within normal limits. No adenopathy. There is aortic atherosclerosis. There is degenerative change in the thoracic spine. No pneumothorax. IMPRESSION: Small bilateral pleural effusions with bibasilar  atelectasis. No frank edema or consolidation. Stable cardiac prominence. There is aortic atherosclerosis. Aortic Atherosclerosis (ICD10-I70.0). Electronically Signed   By: Lowella Grip III M.D.   On: 10/26/2017 13:21    Cardiac Studies   Echocardiogram 10/27/2017: Study Conclusions  - Left ventricle: The cavity size was normal. Wall thickness was   increased in a pattern of mild LVH. Systolic function was   moderately to severely reduced. The estimated ejection fraction   was in the range of 30% to 35%. Diffuse hypokinesis. Doppler   parameters are consistent with abnormal left ventricular   relaxation (grade 1 diastolic dysfunction). Doppler parameters   are consistent with high ventricular filling pressure. - Regional wall motion  abnormality: Hypokinesis of the mid   anteroseptal, mid anterolateral, apical lateral, and apical   myocardium. - Aortic valve: Trileaflet; mildly thickened, mildly calcified   leaflets. There appears to be moderate calcific aortic stenosis.   Peak velocities and mean gradients are underestimated. There was   mild regurgitation. Valve area (Vmean): 1.11 cm^2. - Mitral valve: Mildly calcified leaflets . There was mild to   moderate regurgitation. - Left atrium: The atrium was severely dilated. - Right ventricle: Systolic function was mildly reduced. - Tricuspid valve: Mildly thickened leaflets. There was moderate   regurgitation. - Pulmonic valve: There was mild regurgitation. - Pulmonary arteries: PA peak pressure: 43 mm Hg (S).  Patient Profile     84 y.o. female with known history of multivessel CAD and previous PCI, chronic systolic heart failure, hypertension, hyperlipidemia, and carotid artery disease.  She is now admitted with NSTEMI, peak troponin I 1.04.  Medical therapy is planned without invasive cardiac testing.  Assessment & Plan    1.  NSTEMI, peak troponin I 1.04.  No recurrent chest pain since yesterday.  Medical management is planned.  2.  Chronic systolic heart failure with cardiomyopathy and LVEF 30-35% by follow-up echocardiogram.  Recent chest x-ray showed small pleural effusions.  She is on Lasix.  3.  Essential hypertension.  4.  Mixed hyperlipidemia, on Lipitor.  Patient continues on heparin infusion, will complete 48 hours later this afternoon and can stop at that point.  Continue aspirin, Plavix, Coreg, Lipitor, Imdur, and Lasix.  Start low-dose potassium supplement as well.  No further inpatient cardiac testing is planned.  Next addition would be low-dose ARB if blood pressure tolerates, can be considered as an outpatient.  Anticipate discharge soon.  She will need to keep follow-up with Dr. Percival Spanish.  Signed, Rozann Lesches, MD  10/28/2017, 9:08 AM

## 2017-10-28 NOTE — Progress Notes (Signed)
ANTICOAGULATION CONSULT NOTE - Follow Up Consult  Pharmacy Consult for heparin Indication: elevated troponin/ACS  No Known Allergies  Patient Measurements: Height: 5\' 4"  (162.6 cm) Weight: 124 lb 5.4 oz (56.4 kg) IBW/kg (Calculated) : 54.7 Heparin Dosing Weight: 54.5 kg  Vital Signs: Temp: 98.4 F (36.9 C) (02/07 0500) Temp Source: Oral (02/07 0500) BP: 116/46 (02/07 0500) Pulse Rate: 74 (02/07 0500)  Labs: Recent Labs    10/26/17 1203 10/26/17 2000 10/27/17 0134 10/27/17 0552 10/27/17 1610 10/28/17 0512  HGB 11.3*  --   --  11.0*  --  11.1*  HCT 36.3  --   --  35.1*  --  34.6*  PLT 234  --   --  206  --  202  APTT 146*  --   --   --   --   --   LABPROT 14.9  --   --   --   --   --   INR 1.18  --   --   --   --   --   HEPARINUNFRC  --   --   --  0.18* 0.68 0.46  CREATININE 0.90  --   --  0.97  --  1.01*  TROPONINI 0.32* 1.04* 0.81*  --   --   --     Estimated Creatinine Clearance: 28.1 mL/min (A) (by C-G formula based on SCr of 1.01 mg/dL (H)).   Medications:  Medications Prior to Admission  Medication Sig Dispense Refill Last Dose  . aspirin 81 MG tablet Take 81 mg by mouth daily.     10/26/2017 at 0800  . carvedilol (COREG) 3.125 MG tablet Take 1 tablet (3.125 mg total) by mouth 2 (two) times daily with a meal. 180 tablet 0 10/26/2017 at 0800  . clopidogrel (PLAVIX) 75 MG tablet Take 1 Tablet by mouth once daily 90 tablet 0 10/25/2017 at 1800  . furosemide (LASIX) 40 MG tablet TAKE 1 TABLET BY MOUTH ONCE DAILY 90 tablet 0 10/25/2017 at Unknown time  . isosorbide mononitrate (IMDUR) 30 MG 24 hr tablet Take 1 tablet (30 mg total) by mouth daily. 90 tablet 3 10/26/2017 at 0800  . ketoconazole (NIZORAL) 2 % cream Apply topically daily behind ears when sore, as directed 15 g 0 Taking  . levothyroxine (SYNTHROID, LEVOTHROID) 50 MCG tablet Take 1 Tablet by mouth once daily before breakfast 90 tablet 1 10/26/2017 at 0800  . nitroGLYCERIN (NITROSTAT) 0.4 MG SL tablet Place 1 tablet  (0.4 mg total) under the tongue every 5 (five) minutes as needed for chest pain. 25 tablet 6 10/26/2017 at 0730  . triamcinolone (KENALOG) 0.025 % ointment Apply 1 application topically 2 (two) times daily. 30 g 0 Taking    Assessment: Pharmacy consulted to dose heparin for patient with chest pain and elevated troponin. Heparin level therapeutic at 0.46  Goal of Therapy:  Heparin level 0.3-0.7 units/ml Monitor platelets by anticoagulation protocol: Yes   Plan:  Continue heparin infusion to 850 units/hr Check anti-Xa level daily while on heparin Continue to monitor H&H and platelets    Margot Ables, PharmD Clinical Pharmacist 10/28/2017 7:40 AM

## 2017-10-28 NOTE — Evaluation (Signed)
Physical Therapy Evaluation Patient Details Name: Lori [name] MRN: [id number] DOB: March 30, 1919 Today's Date: 10/28/2017   History of Present Illness  84 yo female with admission for NSTEMI and chest pain has resolved her pain, troponin declining but PT ordered for mobility.  PMHx:  CKD3, CAD, HTN, CHF  Clinical Impression  Pt is in a fairly controlled environment at home and has her husband there with daughter checking in on them.  Pt is motivated to maintain her independence and will be expecting to attempt outpatient therapy to see if she benefits from this.  Pt felt home therapy was not that useful, expecting to see a better quality of program.  Daughter in agreement.  Follow acutely as needed for endurance and balance training with SPC    Follow Up Recommendations Outpatient PT    Equipment Recommendations  None recommended by PT    Recommendations for Other Services       Precautions / Restrictions Precautions Precautions: Fall(telemetry) Restrictions Weight Bearing Restrictions: No      Mobility  Bed Mobility Overal bed mobility: Modified Independent                Transfers Overall transfer level: Modified independent Equipment used: Straight cane             General transfer comment: needs to use more than one attempt  Ambulation/Gait Ambulation/Gait assistance: Min guard Ambulation Distance (Feet): 100 Feet Assistive device: 1 person hand held assist;Straight cane Gait Pattern/deviations: Step-through pattern;Decreased stride length;Wide base of support;Trunk flexed Gait velocity: reduced Gait velocity interpretation: Below normal speed for age/gender General Gait Details: slow pace and talked with pt about ot rushing her pace  Stairs            Wheelchair Mobility    Modified Rankin (Stroke Patients Only)       Balance Overall balance assessment: Needs assistance Sitting-balance support: Feet supported Sitting balance-Leahy Scale:  Fair     Standing balance support: Single extremity supported Standing balance-Leahy Scale: Fair Standing balance comment: needs SPC to minimally steady once walking                             Pertinent Vitals/Pain Pain Assessment: No/denies pain    Home Living Family/patient expects to be discharged to:: Private residence Living Arrangements: Spouse/significant other Available Help at Discharge: Family;Available 24 hours/day Type of Home: House       Home Layout: One level Home Equipment: Cane - single point;Walker - 2 wheels      Prior Function Level of Independence: Independent with assistive device(s)               Hand Dominance   Dominant Hand: Right    Extremity/Trunk Assessment   Upper Extremity Assessment Upper Extremity Assessment: Overall WFL for tasks assessed    Lower Extremity Assessment Lower Extremity Assessment: Overall WFL for tasks assessed    Cervical / Trunk Assessment Cervical / Trunk Assessment: Kyphotic  Communication   Communication: HOH(pt needs very loud volume or lip reads)  Cognition Arousal/Alertness: Awake/alert Behavior During Therapy: WFL for tasks assessed/performed Overall Cognitive Status: Within Functional Limits for tasks assessed                                 General Comments: somewhat impulsive but has been very independent      General Comments General comments (skin  integrity, edema, etc.): O2 sats and pulse WFL during gait    Exercises     Assessment/Plan    PT Assessment Patient needs continued PT services  PT Problem List Decreased range of motion;Decreased activity tolerance;Decreased balance;Decreased mobility;Decreased coordination;Decreased knowledge of use of DME;Decreased safety awareness;Cardiopulmonary status limiting activity       PT Treatment Interventions DME instruction;Gait training;Stair training;Functional mobility training;Therapeutic activities;Therapeutic  exercise;Balance training;Neuromuscular re-education;Patient/family education    PT Goals (Current goals can be found in the Care Plan section)  Acute Rehab PT Goals Patient Stated Goal: to get home and to be independent PT Goal Formulation: With patient/family Time For Goal Achievement: 11/11/17 Potential to Achieve Goals: Good    Frequency Min 3X/week   Barriers to discharge Decreased caregiver support husband is 52 years old    Co-evaluation               AM-PAC PT "6 Clicks" Daily Activity  Outcome Measure Difficulty turning over in bed (including adjusting bedclothes, sheets and blankets)?: A Little Difficulty moving from lying on back to sitting on the side of the bed? : A Little Difficulty sitting down on and standing up from a chair with arms (e.g., wheelchair, bedside commode, etc,.)?: A Lot Help needed moving to and from a bed to chair (including a wheelchair)?: A Little Help needed walking in hospital room?: A Little Help needed climbing 3-5 steps with a railing? : A Little 6 Click Score: 17    End of Session Equipment Utilized During Treatment: Gait belt Activity Tolerance: Patient tolerated treatment well;Patient limited by fatigue Patient left: in bed;with call bell/phone within reach;with family/visitor present(bed alarm off, asked dgtr to have nsg use when she leaves) Nurse Communication: Mobility status PT Visit Diagnosis: Unsteadiness on feet (R26.81);Difficulty in walking, not elsewhere classified (R26.2)    Time: 1610-9604 PT Time Calculation (min) (ACUTE ONLY): 24 min   Charges:   PT Evaluation $PT Eval Low Complexity: 1 Low PT Treatments $Gait Training: 8-22 mins   PT G Codes:   PT G-Codes **NOT FOR INPATIENT CLASS** Functional Assessment Tool Used: AM-PAC 6 Clicks Basic Mobility    Ramond Dial 10/28/2017, 12:13 PM   12:19 PM, 10/28/17 Mee Hives, PT, MS Physical Therapist - [place] [phone] (Office)

## 2017-10-28 NOTE — Care Management Note (Signed)
Case Management Note  Patient Details  Name: Lori [name] MRN: [id number] Date of Birth: [birth year]  Subjective/Objective:  Admitted for R/O chest pain. Pt is from home, lives with husband who is 82 y/o. Pt ind with ADL's. Has daughter who is very attentive and helps on a daily basis. Pt has no HH or DME needs pta. She does have cane and walker if she were to need them.                    Action/Plan: PT recommends OP PT. Pt prefers services in [place]. Referral sent. discharging home today. Daughter at bedside for DC planning.   Expected Discharge Date:  10/28/17               Expected Discharge Plan:  Home/Self Care  In-House Referral:  NA  Discharge planning Services  CM Consult  Post Acute Care Choice:  NA Choice offered to:  NA  Status of Service:  Completed, signed off  Sherald Barge, RN 10/28/2017, 3:47 PM

## 2017-10-28 NOTE — Discharge Summary (Addendum)
Physician Discharge Summary  Lori [name] VOZ:[id number] DOB: Sep 02, 1919 DOA: 10/26/2017  PCP: Eustaquio Maize, MD  Admit date: 10/26/2017 Discharge date: 10/28/2017  Admitted From: Home Disposition: Home  Recommendations for Outpatient Follow-up:  1. Follow up with PCP in 1-2 weeks 2. Please obtain BMP/CBC in one week 3. Follow-up with cardiology in 1-2 weeks  Home Health: Equipment/Devices:  Discharge Condition: Stable CODE STATUS: Full code Diet recommendation: Heart Healthy    Brief/Interim Summary: 84 year old female with a history of coronary artery disease, hypertension, chronic kidney disease stage III, presented to the hospital with chest pain.  She was found to have elevated troponin consistent with non-ST elevation MI.  Seen by cardiology who recommended medical management.    Discharge Diagnoses:  Active Problems:   CORONARY ATHEROSCLEROSIS NATIVE CORONARY ARTERY   NSTEMI (non-ST elevated myocardial infarction) (HCC)   Angina pectoris (HCC)   Chronic systolic CHF (congestive heart failure) (HCC)   CKD (chronic kidney disease), stage III (HCC)   Chronic combined systolic and diastolic CHF (congestive heart failure) ([place])  1. Non-ST elevation MI.  Peak troponin of 1.04.    Seen by cardiology and recommendations were medical management.  The patient was treated with intravenous heparin, continued on Imdur, beta-blockers, aspirin and Plavix.  Imdur was increased to 60 mg daily.  Completed 48 hours of heparin.  She is not having any further chest pain or shortness of breath.  Follow-up with cardiology as an outpatient. 2. Chronic systolic heart failure.  Echocardiogram showed ejection fraction of 30-35%.  Continue on beta-blockers.    Not a candidate for ACE inhibitors due to chronic kidney disease.  Appears euvolemic at this time.  She is continued on home dose of Lasix.   3. Hypertension.  Blood pressure is stable.  Continue current treatments. 4. Hyperlipidemia.  Continue  Lipitor. 5. Chronic kidney disease stage III.  Creatinine is currently at baseline.  Continue to monitor. 6. Hypothyroidism.  Continue on Synthroid.     Discharge Instructions  Discharge Instructions    Ambulatory referral to Physical Therapy   Complete by:  As directed    Diet - low sodium heart healthy   Complete by:  As directed    Increase activity slowly   Complete by:  As directed      Allergies as of 10/28/2017   No Known Allergies     Medication List    TAKE these medications   aspirin 81 MG tablet Take 81 mg by mouth daily.   carvedilol 3.125 MG tablet Commonly known as:  COREG Take 1 tablet (3.125 mg total) by mouth 2 (two) times daily with a meal.   clopidogrel 75 MG tablet Commonly known as:  PLAVIX Take 1 Tablet by mouth once daily   furosemide 40 MG tablet Commonly known as:  LASIX Take 1 tablet (40 mg total) by mouth daily.   isosorbide mononitrate 60 MG 24 hr tablet Commonly known as:  IMDUR Take 1 tablet (60 mg total) by mouth daily. What changed:    medication strength  how much to take   ketoconazole 2 % cream Commonly known as:  NIZORAL Apply topically daily behind ears when sore, as directed   levothyroxine 50 MCG tablet Commonly known as:  SYNTHROID, LEVOTHROID Take 1 Tablet by mouth once daily before breakfast   nitroGLYCERIN 0.4 MG SL tablet Commonly known as:  NITROSTAT Place 1 tablet (0.4 mg total) under the tongue every 5 (five) minutes as needed for chest pain.  pravastatin 10 MG tablet Commonly known as:  PRAVACHOL Take 1 tablet (10 mg total) by mouth every evening.   triamcinolone 0.025 % ointment Commonly known as:  KENALOG Apply 1 application topically 2 (two) times daily.      Follow-up Information    Minus Breeding, MD Follow up.   Specialty:  Cardiology Why:  Call for appointment in 1-2 weeks Contact information: [place] [address] [phone]          No Known  Allergies  Consultations:  Cardiology   Procedures/Studies: Dg Chest 2 View  Result Date: 10/26/2017 CLINICAL DATA:  Chest pain and fever EXAM: CHEST  2 VIEW COMPARISON:  June 30, 2017 FINDINGS: There are small pleural effusions bilaterally with bibasilar atelectatic change. There is no frank edema or consolidation. Heart is borderline enlarged with pulmonary vascularity within normal limits. No adenopathy. There is aortic atherosclerosis. There is degenerative change in the thoracic spine. No pneumothorax. IMPRESSION: Small bilateral pleural effusions with bibasilar atelectasis. No frank edema or consolidation. Stable cardiac prominence. There is aortic atherosclerosis. Aortic Atherosclerosis (ICD10-I70.0). Electronically Signed   By: Lowella Grip III M.D.   On: 10/26/2017 13:21   Echo:- Left ventricle: The cavity size was normal. Wall thickness was   increased in a pattern of mild LVH. Systolic function was   moderately to severely reduced. The estimated ejection fraction   was in the range of 30% to 35%. Diffuse hypokinesis. Doppler   parameters are consistent with abnormal left ventricular   relaxation (grade 1 diastolic dysfunction). Doppler parameters   are consistent with high ventricular filling pressure. - Regional wall motion abnormality: Hypokinesis of the mid   anteroseptal, mid anterolateral, apical lateral, and apical   myocardium. - Aortic valve: Trileaflet; mildly thickened, mildly calcified   leaflets. There appears to be moderate calcific aortic stenosis.   Peak velocities and mean gradients are underestimated. There was   mild regurgitation. Valve area (Vmean): 1.11 cm^2. - Mitral valve: Mildly calcified leaflets . There was mild to   moderate regurgitation. - Left atrium: The atrium was severely dilated. - Right ventricle: Systolic function was mildly reduced. - Tricuspid valve: Mildly thickened leaflets. There was moderate   regurgitation. - Pulmonic valve:  There was mild regurgitation. - Pulmonary arteries: PA peak pressure: 43 mm Hg (S).   Subjective: No chest pain or shortness of breath.  Discharge Exam: Vitals:   10/28/17 0500 10/28/17 1345  BP: (!) 116/46 (!) 121/51  Pulse: 74 76  Resp:  18  Temp: 98.4 F (36.9 C) 98.5 F (36.9 C)  SpO2: 95% 96%   Vitals:   10/27/17 2017 10/27/17 2229 10/28/17 0500 10/28/17 1345  BP: (!) 124/49  (!) 116/46 (!) 121/51  Pulse: 74  74 76  Resp:    18  Temp: 98.2 F (36.8 C)  98.4 F (36.9 C) 98.5 F (36.9 C)  TempSrc: Oral  Oral Oral  SpO2: 97% 96% 95% 96%  Weight:   56.4 kg (124 lb 5.4 oz)   Height:        General: Pt is alert, awake, not in acute distress Cardiovascular: RRR, S1/S2 +, no rubs, no gallops Respiratory: CTA bilaterally, no wheezing, no rhonchi Abdominal: Soft, NT, ND, bowel sounds + Extremities: no edema, no cyanosis    The results of significant diagnostics from this hospitalization (including imaging, microbiology, ancillary and laboratory) are listed below for reference.     Microbiology: No results found for this or any previous visit (  from the past 240 hour(s)).   Labs: BNP (last 3 results) Recent Labs    06/30/17 0714  BNP 932.6*   Basic Metabolic Panel: Recent Labs  Lab 10/26/17 1203 10/27/17 0552 10/28/17 0512  NA 132* 132* 132*  K 4.8 4.2 3.9  CL 101 99* 98*  CO2 22 23 24   GLUCOSE 90 90 93  BUN 17 18 20   CREATININE 0.90 0.97 1.01*  CALCIUM 8.3* 8.4* 8.3*   Liver Function Tests: No results for input(s): AST, ALT, ALKPHOS, BILITOT, PROT, ALBUMIN in the last 168 hours. No results for input(s): LIPASE, AMYLASE in the last 168 hours. No results for input(s): AMMONIA in the last 168 hours. CBC: Recent Labs  Lab 10/26/17 1203 10/27/17 0552 10/28/17 0512  WBC 8.2 6.5 6.3  HGB 11.3* 11.0* 11.1*  HCT 36.3 35.1* 34.6*  MCV 83.4 83.4 82.4  PLT 234 206 202   Cardiac Enzymes: Recent Labs  Lab 10/26/17 1203 10/26/17 2000 10/27/17 0134   TROPONINI 0.32* 1.04* 0.81*   BNP: Invalid input(s): POCBNP CBG: No results for input(s): GLUCAP in the last 168 hours. D-Dimer No results for input(s): DDIMER in the last 72 hours. Hgb A1c No results for input(s): HGBA1C in the last 72 hours. Lipid Profile No results for input(s): CHOL, HDL, LDLCALC, TRIG, CHOLHDL, LDLDIRECT in the last 72 hours. Thyroid function studies No results for input(s): TSH, T4TOTAL, T3FREE, THYROIDAB in the last 72 hours.  Invalid input(s): FREET3 Anemia work up No results for input(s): VITAMINB12, FOLATE, FERRITIN, TIBC, IRON, RETICCTPCT in the last 72 hours. Urinalysis    Component Value Date/Time   COLORURINE YELLOW 10/25/2016 1847   APPEARANCEUR HAZY (A) 10/25/2016 1847   LABSPEC 1.009 10/25/2016 1847   PHURINE 6.0 10/25/2016 1847   GLUCOSEU NEGATIVE 10/25/2016 1847   HGBUR SMALL (A) 10/25/2016 1847   BILIRUBINUR NEGATIVE 10/25/2016 1847   BILIRUBINUR negative 10/08/2015 1725   KETONESUR NEGATIVE 10/25/2016 1847   PROTEINUR NEGATIVE 10/25/2016 1847   UROBILINOGEN negative 10/08/2015 1725   UROBILINOGEN 0.2 05/25/2013 1525   NITRITE NEGATIVE 10/25/2016 1847   LEUKOCYTESUR SMALL (A) 10/25/2016 1847   Sepsis Labs Invalid input(s): PROCALCITONIN,  WBC,  LACTICIDVEN Microbiology No results found for this or any previous visit (from the past 240 hour(s)).   Time coordinating discharge: Over 30 minutes  SIGNED:   Kathie Dike, MD  [facility] 10/28/2017, 4:45 PM Pager   If 7PM-7AM, please contact night-coverage [email or web address] Password [email or web address]

## 2017-10-29 ENCOUNTER — Telehealth: Payer: Self-pay | Admitting: Cardiology

## 2017-10-29 MED ORDER — NITROGLYCERIN 0.4 MG SL SUBL: 0.4000 mg | SUBLINGUAL_TABLET | SUBLINGUAL | 0 refills | Status: DC | PRN

## 2017-10-29 MED FILL — NITROGLYCERIN 0.4 MG TAB SL: 0.4 | 15 days supply | Qty: 25 | Fill #0

## 2017-10-29 NOTE — Telephone Encounter (Signed)
Note not needed 

## 2017-10-29 NOTE — Telephone Encounter (Signed)
°*  STAT* If patient is at the pharmacy, call can be transferred to refill team.   1. Which medications need to be refilled? (please list name of each medication and dose if known) need new prrescription for Nitroglycerin-Please call before 6:00  2. Which pharmacy/location (including street and city if local pharmacy) is medication to be sent to?[facility]  3. Do they need a 30 day or 90 day supply?

## 2017-10-29 NOTE — Telephone Encounter (Signed)
The patient's husband is currently in the hospital and the patient has run out of nitroglycerin. She has some at home but does not want to leave her husband. She has requested 5 pills be called in at the [facility]. They have been sent for her.

## 2017-11-01 ENCOUNTER — Telehealth: Payer: Self-pay | Admitting: *Deleted

## 2017-11-01 NOTE — Telephone Encounter (Signed)
Call Completed and Appointment Scheduled: Yes, Date: 11/09/17 with Dr Emiliano Dyer INFORMATION Date of Discharge:10/28/17  Discharge Facility: [facility] Discharge Diagnosis: MI  Patient and/or caregiver is knowledgeable of his/her condition(s) and treatment: Yes  MEDICATION RECONCILIATION Current medication list reviewed with patient:Yes  Outpatient Encounter Medications as of 11/01/2017  Medication Sig  . aspirin 81 MG tablet Take 81 mg by mouth daily.    . carvedilol (COREG) 3.125 MG tablet Take 1 tablet (3.125 mg total) by mouth 2 (two) times daily with a meal.  . clopidogrel (PLAVIX) 75 MG tablet Take 1 Tablet by mouth once daily  . furosemide (LASIX) 40 MG tablet Take 1 tablet (40 mg total) by mouth daily.  . isosorbide mononitrate (IMDUR) 60 MG 24 hr tablet Take 1 tablet (60 mg total) by mouth daily.  [name] Kitchen ketoconazole (NIZORAL) 2 % cream Apply topically daily behind ears when sore, as directed  . levothyroxine (SYNTHROID, LEVOTHROID) 50 MCG tablet Take 1 Tablet by mouth once daily before breakfast  . nitroGLYCERIN (NITROSTAT) 0.4 MG SL tablet Place 1 tablet (0.4 mg total) under the tongue every 5 (five) minutes as needed for chest pain.  . pravastatin (PRAVACHOL) 10 MG tablet Take 1 tablet (10 mg total) by mouth every evening.  . triamcinolone (KENALOG) 0.025 % ointment Apply 1 application topically 2 (two) times daily.   No facility-administered encounter medications on file as of 11/01/2017.     Discharge Medications reviewed and reconciled with current medications.yes  Patient is able to obtain needed medications:Yes  ACTIVITIES OF DAILY LIVING  Is the patient able to perform his/her own ADLs: Yes.    Patient is receiving home health services: No.  PATIENT EDUCATION Questions/Concerns Discussed: No concerns at this. Patient is scheduled for outpatient physical therapy starting tomorrow.   She will f/u sooner if needed.  Chong Sicilian, RN

## 2017-11-02 ENCOUNTER — Ambulatory Visit: Payer: Medicare Other | Admitting: Physical Therapy

## 2017-11-08 ENCOUNTER — Encounter: Payer: Self-pay | Admitting: Pediatrics

## 2017-11-08 ENCOUNTER — Ambulatory Visit (INDEPENDENT_AMBULATORY_CARE_PROVIDER_SITE_OTHER): Payer: Medicare Other | Admitting: Pediatrics

## 2017-11-08 VITALS — BP 119/68 | HR 82 | Temp 96.9°F | Ht 64.0 in | Wt 120.0 lb

## 2017-11-08 DIAGNOSIS — I509 Heart failure, unspecified: Secondary | ICD-10-CM

## 2017-11-08 DIAGNOSIS — I2510 Atherosclerotic heart disease of native coronary artery without angina pectoris: Secondary | ICD-10-CM

## 2017-11-08 DIAGNOSIS — I10 Essential (primary) hypertension: Secondary | ICD-10-CM

## 2017-11-08 MED ORDER — FUROSEMIDE 40 MG PO TABS: 20.0000 mg | ORAL_TABLET | Freq: Every day | ORAL | 1 refills | Status: DC | PRN

## 2017-11-08 NOTE — Patient Instructions (Signed)
Minus Breeding, MD Follow up.   Specialty:  Cardiology Why:  Call for appointment in 1-2 weeks Contact information: [place] [address] [phone]

## 2017-11-08 NOTE — Progress Notes (Signed)
  Subjective:   Patient ID: Lori [name], female    DOB: Feb 05, 1919, 84 y.o.   MRN: [id number] CC: Transitional Care Management  HPI: Lori [name] is a 84 y.o. female presenting for Transitional Care Management  Recently in hospital for NSTEMI, treated medically.  Taking 22m of imdur now daily. Was increased to 661mimdur in the hospital, felt bad, doesn't remember why but says she remembers she says she wasn't going to take it again. Here today with her daughter.  Continues to have some chest pain worse in the morning when she wakes up.  She says this was going on in the hospital as well.  Does not have any pain later on throughout the day when she is up moving around more.  No swelling in ankles or feet  Relevant past medical, surgical, family and social history reviewed. Allergies and medications reviewed and updated. Social History   Tobacco Use  Smoking Status Never Smoker  Smokeless Tobacco Never Used   ROS: Per HPI   Objective:    BP 119/68   Pulse 82   Temp (!) 96.9 F (36.1 C) (Oral)   Ht _0  (1.626 m)   Wt 120 lb (54.4 kg)   BMI 20.60 kg/m   Wt Readings from Last 3 Encounters:  11/08/17 120 lb (54.4 kg)  10/28/17 124 lb 5.4 oz (56.4 kg)  08/25/17 120 lb (54.4 kg)    Gen: NAD, alert, NCAT EYES: EOMI, no conjunctival injection, or no icterus ENT: OP without erythema LYMPH: no cervical LAD CV: NRRR, normal S1/S2 Resp: CTABL, no wheezes, normal WOB Abd: +BS, soft, NTND. no guarding or organomegaly Ext: No edema, warm Neuro: Alert and oriented, strength equal b/l UE and LE, coordination grossly normal  Assessment & Plan:  Marlie was seen today for hospital follow-up.  Recent hospitalization for an STEMI, medically managed.  Diagnoses and all orders for this visit:  Congestive heart failure, unspecified HF chronicity, unspecified heart failure type (HCFarrellEF most recently 35%.  Not on ACE inhibitor due to chronic kidney disease.   -      BMP8+EGFR  CAD Patient adamant about not wanting to take cholesterol medicine.  HTN Stable, continue current medicines  Follow up plan: Return in about 3 months (around 02/05/2018). [facility]ta FoundMD [facility]

## 2017-11-09 LAB — BMP8+EGFR
BUN/Creatinine Ratio: 22 (ref 12–28)
BUN: 22 mg/dL (ref 10–36)
CALCIUM: 8.6 mg/dL — AB (ref 8.7–10.3)
CO2: 21 mmol/L (ref 20–29)
CREATININE: 1.02 mg/dL — AB (ref 0.57–1.00)
Chloride: 98 mmol/L (ref 96–106)
GFR calc Af Amer: 53 mL/min/{1.73_m2} — ABNORMAL LOW (ref >59)
GFR, EST NON AFRICAN AMERICAN: 46 mL/min/{1.73_m2} — AB (ref >59)
GLUCOSE: 97 mg/dL (ref 65–99)
Potassium: 5 mmol/L (ref 3.5–5.2)
SODIUM: 134 mmol/L (ref 134–144)

## 2017-11-16 ENCOUNTER — Telehealth: Payer: Self-pay | Admitting: Pediatrics

## 2017-11-16 NOTE — Telephone Encounter (Signed)
[name] aware of patient's lab results

## 2017-11-20 ENCOUNTER — Other Ambulatory Visit: Payer: Self-pay | Admitting: Cardiology

## 2017-12-14 ENCOUNTER — Ambulatory Visit (INDEPENDENT_AMBULATORY_CARE_PROVIDER_SITE_OTHER): Payer: Medicare Other | Admitting: Physician Assistant

## 2017-12-14 ENCOUNTER — Encounter: Payer: Self-pay | Admitting: Physician Assistant

## 2017-12-14 VITALS — BP 127/70 | HR 94 | Temp 97.4°F | Ht 64.0 in | Wt 127.4 lb

## 2017-12-14 DIAGNOSIS — S51012A Laceration without foreign body of left elbow, initial encounter: Secondary | ICD-10-CM | POA: Diagnosis not present

## 2017-12-14 DIAGNOSIS — R600 Localized edema: Secondary | ICD-10-CM

## 2017-12-14 MED ORDER — CEPHALEXIN 500 MG PO CAPS: 500.0000 mg | ORAL_CAPSULE | Freq: Two times a day (BID) | ORAL | 0 refills | Status: DC

## 2017-12-14 NOTE — Progress Notes (Signed)
BP 127/70   Pulse 94   Temp (!) 97.4 F (36.3 C) (Oral)   Ht 5\' 4"  (1.626 m)   Wt 127 lb 6.4 oz (57.8 kg)   BMI 21.87 kg/m    Subjective:    Patient ID: Lori [name], female    DOB: [birth year], 84 y.o.   MRN: [id number]  HPI: Lori [name] is a 84 y.o. female presenting on 12/14/2017 for Abrasion (scrapped leg last pm)  Yesterday the patient scratched her left lower leg.  She created a skin tear.  She has been having a little more edema in her lower legs.  She is currently sitting with her husband at the hospice house.  She knows she has been much less active.  She has not taken her Lasix on a regular basis. Her husband has pancreatic cancer.  Past Medical History:  Diagnosis Date  . Arthritis   . CAD (coronary artery disease)    a. NSTEMI in 2011 s/p PCI/DES to distal Cx, s/p PCI/DES to OM2 (EF 40%). b. NSTEMI 02/2012 treated conservatively.  . Carotid artery stenosis    Left carotid endarterectomy 02/2001  . CHF (congestive heart failure) ([place])    a. EF 40% in 2011. b. EF 35-40% by echo 03/12/12.  [name] Kitchen Hyperlipidemia   . Hypertension   . Hyponatremia    Noted on 02/2012 admission  . Pneumonia    02/2012  . Renal insufficiency    Noted on 02/2012 admission  . Respiratory failure ([place])    02/2012 - requiring bipap - multifactorial in setting of PNA/CHF/NSTEMI  . Thyroid disease    Euthyroid 02/2012   Relevant past medical, surgical, family and social history reviewed and updated as indicated. Interim medical history since our last visit reviewed. Allergies and medications reviewed and updated. DATA REVIEWED: CHART IN EPIC  Family History reviewed for pertinent findings.  Review of Systems  Constitutional: Negative.   HENT: Negative.   Eyes: Negative.   Respiratory: Negative.   Gastrointestinal: Negative.   Genitourinary: Negative.   Skin: Positive for wound.    Allergies as of 12/14/2017   No Known Allergies     Medication List        Accurate as of 12/14/17   6:05 PM. Always use your most recent med list.          aspirin 81 MG tablet Take 81 mg by mouth daily.   carvedilol 3.125 MG tablet Commonly known as:  COREG TAKE 1 TABLET BY MOUTH 2 TIMES A DAY WITH MEALS   cephALEXin 500 MG capsule Commonly known as:  KEFLEX Take 1 capsule (500 mg total) by mouth 2 (two) times daily.   clopidogrel 75 MG tablet Commonly known as:  PLAVIX Take 1 Tablet by mouth once daily   furosemide 40 MG tablet Commonly known as:  LASIX Take 0.5 tablets (20 mg total) by mouth daily as needed.   isosorbide mononitrate 60 MG 24 hr tablet Commonly known as:  IMDUR Take 1 tablet (60 mg total) by mouth daily.   ketoconazole 2 % cream Commonly known as:  NIZORAL Apply topically daily behind ears when sore, as directed   levothyroxine 50 MCG tablet Commonly known as:  SYNTHROID, LEVOTHROID Take 1 Tablet by mouth once daily before breakfast   nitroGLYCERIN 0.4 MG SL tablet Commonly known as:  NITROSTAT Place 1 tablet (0.4 mg total) under the tongue every 5 (five) minutes as needed for chest pain.   pravastatin 10 MG tablet Commonly  known as:  PRAVACHOL Take 1 tablet (10 mg total) by mouth every evening.   triamcinolone 0.025 % ointment Commonly known as:  KENALOG Apply 1 application topically 2 (two) times daily.          Objective:    BP 127/70   Pulse 94   Temp (!) 97.4 F (36.3 C) (Oral)   Ht 5\' 4"  (1.626 m)   Wt 127 lb 6.4 oz (57.8 kg)   BMI 21.87 kg/m   No Known Allergies  Wt Readings from Last 3 Encounters:  12/14/17 127 lb 6.4 oz (57.8 kg)  11/08/17 120 lb (54.4 kg)  10/28/17 124 lb 5.4 oz (56.4 kg)    Physical Exam  Constitutional: She is oriented to person, place, and time. She appears well-developed and well-nourished.  HENT:  Head: Normocephalic and atraumatic.  Eyes: Pupils are equal, round, and reactive to light. Conjunctivae and EOM are normal.  Cardiovascular: Normal rate, regular rhythm, normal heart sounds and  intact distal pulses.  Pulmonary/Chest: Effort normal and breath sounds normal.  Abdominal: Soft. Bowel sounds are normal.  Neurological: She is alert and oriented to person, place, and time. She has normal reflexes.  Skin: Skin is warm and dry. Abrasion and bruising noted. No rash noted. No erythema.     Psychiatric: She has a normal mood and affect. Her behavior is normal. Judgment and thought content normal.        Assessment & Plan:   1. Skin tear of left elbow without complication, initial encounter - cephALEXin (KEFLEX) 500 MG capsule; Take 1 capsule (500 mg total) by mouth 2 (two) times daily.  Dispense: 20 capsule; Refill: 0  2. Localized edema Take 1/2 tab of lasix daily to keep fluid down   Continue all other maintenance medications as listed above.  Follow up plan: Follow-up as needed or worsening of symptoms. Call office for any issues.  Educational handout given for [place] PA-C [facility] [address]  [place], [place] [address] [phone]   12/14/2017, 6:05 PM

## 2017-12-20 ENCOUNTER — Telehealth: Payer: Self-pay | Admitting: Cardiology

## 2017-12-20 MED ORDER — NITROGLYCERIN 0.4 MG SL SUBL: 0.4000 mg | SUBLINGUAL_TABLET | SUBLINGUAL | 1 refills | Status: DC | PRN

## 2017-12-20 NOTE — Telephone Encounter (Signed)
New Message    *STAT* If patient is at the pharmacy, call can be transferred to refill team.   1. Which medications need to be refilled? (please list name of each medication and dose if known) nitroGLYCERIN (NITROSTAT) 0.4 MG SL tablet   2. Which pharmacy/location (including street and city if local pharmacy) is medication to be sent to? [facility] 3. Do they need a 30 day or 90 day supply? 76   Patient daughter is calling in for a new rx for the nitroglycerin. She wants it to be a 90 day supply. She says her mom take 3-6 pills on average daily.

## 2017-12-21 ENCOUNTER — Telehealth: Payer: Self-pay | Admitting: Cardiology

## 2017-12-21 DIAGNOSIS — Z79899 Other long term (current) drug therapy: Secondary | ICD-10-CM

## 2017-12-21 DIAGNOSIS — R609 Edema, unspecified: Secondary | ICD-10-CM

## 2017-12-21 MED ORDER — NITROGLYCERIN 0.4 MG SL SUBL: 0.4000 mg | SUBLINGUAL_TABLET | SUBLINGUAL | 4 refills | Status: AC | PRN

## 2017-12-21 NOTE — Telephone Encounter (Signed)
Spoke with pt's daughter  Re message and pt recently lost husband.  Pt is complaining of sob with very little activity (brushing Teeth) and legs are swollen .Discussed with Dr Percival Spanish  have pt increase Furosemide to 40 mg every with bmet in 1 week and to call if no improvement.Pt's daughter aware of recommendations and agrees with plan ./cy

## 2017-12-21 NOTE — Telephone Encounter (Signed)
New Message   Pt c/o Shortness Of Breath: STAT if SOB developed within the last 24 hours or pt is noticeably SOB on the phone  1. Are you currently SOB (can you hear that pt is SOB on the phone)? No, did not speak with patient spoke with daughter    2. How long have you been experiencing SOB? About a week.   3. Are you SOB when sitting or when up moving around? When moving around and first thing in the morning   4. Are you currently experiencing any other symptoms? No. But her daughter states that she has lost her appetite and that her mother says her heart is racing. Her spouse just passed away in the last week.

## 2017-12-30 ENCOUNTER — Other Ambulatory Visit: Payer: Self-pay | Admitting: *Deleted

## 2017-12-30 MED ORDER — CLOPIDOGREL BISULFATE 75 MG PO TABS: 75.0000 mg | ORAL_TABLET | Freq: Every day | ORAL | 0 refills | Status: AC

## 2018-01-06 ENCOUNTER — Telehealth: Payer: Self-pay | Admitting: Cardiology

## 2018-01-06 NOTE — Telephone Encounter (Signed)
I will be out of the office next week (after Monday).  She can be added to my schedule after next week (Monday is full) or be added to an APP if a spot opens up.  Needs to go to the ED if she has symptoms of pain or SOB.  Check a BMET please.

## 2018-01-06 NOTE — Telephone Encounter (Signed)
Unable to leave message, no voicemail. Will send to triage to follow up since I will be out of the office tomorrow and next week

## 2018-01-06 NOTE — Telephone Encounter (Signed)
Spoke with patients daughter and her husband recently passed away 01/08/23). Patient was in hospital 10/26/17 with NSTEMI and presented with CP and SOB. No follow up appointment since then.  Per daughter patient shortness of breath is worse in the am and she feels better by noon. She did increase Furosemide to 40 mg daily, no labs yet. This has been going on for several weeks. No available appointment with PA soon, will forward to Dr Percival Spanish for review.

## 2018-01-06 NOTE — Telephone Encounter (Signed)
Pt c/o Shortness Of Breath: STAT if SOB developed within the last 24 hours or pt is noticeably SOB on the phone  1. Are you currently SOB (can you hear that pt is SOB on the phone)?  No, pt daughter verbalized that her sob is in the morning   2. How long have you been experiencing SOB?  Last 3 weeks  3. Are you SOB when sitting or when up moving around? Up   4. Are you currently experiencing any other symptoms? No  Pt husband passed recently

## 2018-01-07 NOTE — Telephone Encounter (Signed)
Spoke with pt dtr, follow up scheduled in [place] at her request. Aware to take patient to the er prior to appointment with concerns.

## 2018-01-12 ENCOUNTER — Encounter: Payer: Self-pay | Admitting: Cardiology

## 2018-01-13 ENCOUNTER — Telehealth: Payer: Self-pay | Admitting: Cardiology

## 2018-01-13 DIAGNOSIS — R0682 Tachypnea, not elsewhere classified: Secondary | ICD-10-CM | POA: Diagnosis not present

## 2018-01-13 NOTE — Telephone Encounter (Signed)
Returned call to daughter (ok per DPR)- daughter states she believes patients SOB is coming from anxiety.    She states she had to call EMS this morning for her breathing and everything looked good (vitals, O2, EKG) and they mentioned anxiety as well.   Patient is doing okay now, no SOB.  She states Dr. Percival Spanish has mentioned possibly using "rescue O2" for episodes like this.   She was wondering if he would order this for patient to have.  She would like this instead of using medication due to patients age.   Advised Dr. Percival Spanish is OOO this week but would send message to him to address on return.   We also discussed discussing with PCP as well.   Daughter agreed and will call PCP to see if they can see her this week.    Patient has appt 5/8 with Dr. Percival Spanish and will discuss with him at that time as well.    Routed to MD to make aware.

## 2018-01-13 NOTE — Telephone Encounter (Signed)
Pt's daughter calling  Daughter want to talk to nurse concerning getting pt Rescue O2. Please advise

## 2018-01-14 ENCOUNTER — Encounter: Payer: Self-pay | Admitting: Family Medicine

## 2018-01-14 ENCOUNTER — Ambulatory Visit (INDEPENDENT_AMBULATORY_CARE_PROVIDER_SITE_OTHER): Payer: Medicare Other | Admitting: Family Medicine

## 2018-01-14 ENCOUNTER — Telehealth: Payer: Self-pay | Admitting: Pediatrics

## 2018-01-14 ENCOUNTER — Ambulatory Visit (INDEPENDENT_AMBULATORY_CARE_PROVIDER_SITE_OTHER): Payer: Medicare Other

## 2018-01-14 ENCOUNTER — Other Ambulatory Visit: Payer: Self-pay | Admitting: Family Medicine

## 2018-01-14 VITALS — BP 138/75 | HR 94 | Temp 97.4°F | Ht 64.0 in | Wt 126.2 lb

## 2018-01-14 DIAGNOSIS — R0602 Shortness of breath: Secondary | ICD-10-CM | POA: Diagnosis not present

## 2018-01-14 DIAGNOSIS — I5023 Acute on chronic systolic (congestive) heart failure: Secondary | ICD-10-CM | POA: Diagnosis not present

## 2018-01-14 DIAGNOSIS — R079 Chest pain, unspecified: Secondary | ICD-10-CM | POA: Diagnosis not present

## 2018-01-14 LAB — CMP14+EGFR
ALK PHOS: 138 IU/L — AB (ref 39–117)
ALT: 62 IU/L — AB (ref 0–32)
AST: 74 IU/L — AB (ref 0–40)
Albumin/Globulin Ratio: 1.9 (ref 1.2–2.2)
Albumin: 3.7 g/dL (ref 3.2–4.6)
BUN/Creatinine Ratio: 19 (ref 12–28)
BUN: 23 mg/dL (ref 10–36)
Bilirubin Total: 0.4 mg/dL (ref 0.0–1.2)
CALCIUM: 8.7 mg/dL (ref 8.7–10.3)
CO2: 26 mmol/L (ref 20–29)
Chloride: 99 mmol/L (ref 96–106)
Creatinine, Ser: 1.24 mg/dL — ABNORMAL HIGH (ref 0.57–1.00)
GFR calc Af Amer: 42 mL/min/{1.73_m2} — ABNORMAL LOW (ref >59)
GFR, EST NON AFRICAN AMERICAN: 36 mL/min/{1.73_m2} — AB (ref >59)
GLOBULIN, TOTAL: 2 g/dL (ref 1.5–4.5)
Glucose: 95 mg/dL (ref 65–99)
Potassium: 5.2 mmol/L (ref 3.5–5.2)
SODIUM: 139 mmol/L (ref 134–144)
Total Protein: 5.7 g/dL — ABNORMAL LOW (ref 6.0–8.5)

## 2018-01-14 MED ORDER — LORAZEPAM 0.5 MG PO TABS: 0.2500 mg | ORAL_TABLET | Freq: Two times a day (BID) | ORAL | 0 refills | Status: DC | PRN

## 2018-01-14 NOTE — Telephone Encounter (Signed)
Pt's daughter is aware of MD feedback and voiced understanding.

## 2018-01-14 NOTE — Progress Notes (Signed)
Subjective: CC: Shortness of breath PCP: Eustaquio Maize, MD Lori [name] is a 84 y.o. female presenting to clinic today for:  1. Shortness of breath Patient is accompanied by her daughter who reports that patient became short of breath yesterday morning and was evaluated by EMS.  She reports that vital signs, including pulse ox and EKG were within normal limits.  Per report, patient's cardiologist, Dr. Percival Spanish, had mentioned possibly using "rescue oxygen" for episodes like this.  She is here to see if this can be ordered.    Her daughter notes that she has had increasing lower extremity edema over the last week.  Patient reports that she is only missed 1 dose of Lasix 40 mg.  She did take a dose this morning.  Since an injury at her husband's hospice facility about a month ago to left lower extremity, her daughter notes that the left lower extremity seems to be more swollen than the right.  Patient reports that she has dyspnea all the time.  She reports that she is unable to lay in bed at nighttime and frequently uses her recliner for sleeping.  She notes that the lower extremity edema seems to improve with elevation of the lower extremities but she has not been doing this much.  Her daughter does question if there is an anxiety component to her shortness of breath, as her husband has recently passed.  She notes that she stayed with her at home last evening in hopes that her shortness of breath would improve because anxiety would be reduced but this did not occur.  Patient took 3 nitroglycerin tablets this morning in efforts to relieve her shortness of breath.  She denies chest pains.  She has not seen her cardiologist since December but does plan to see him when he returns from vacation next week.     ROS: Per HPI  No Known Allergies Past Medical History:  Diagnosis Date  . Arthritis   . CAD (coronary artery disease)    a. NSTEMI in 2011 s/p PCI/DES to distal Cx, s/p PCI/DES to  OM2 (EF 40%). b. NSTEMI 02/2012 treated conservatively.  . Carotid artery stenosis    Left carotid endarterectomy 02/2001  . CHF (congestive heart failure) ([place])    a. EF 40% in 2011. b. EF 35-40% by echo 03/12/12.  [name] Kitchen Hyperlipidemia   . Hypertension   . Hyponatremia    Noted on 02/2012 admission  . Pneumonia    02/2012  . Renal insufficiency    Noted on 02/2012 admission  . Respiratory failure ([place])    02/2012 - requiring bipap - multifactorial in setting of PNA/CHF/NSTEMI  . Thyroid disease    Euthyroid 02/2012    Current Outpatient Medications:  .  aspirin 81 MG tablet, Take 81 mg by mouth daily.  , Disp: , Rfl:  .  carvedilol (COREG) 3.125 MG tablet, TAKE 1 TABLET BY MOUTH 2 TIMES A DAY WITH MEALS, Disp: 180 tablet, Rfl: 0 .  cephALEXin (KEFLEX) 500 MG capsule, Take 1 capsule (500 mg total) by mouth 2 (two) times daily., Disp: 20 capsule, Rfl: 0 .  clopidogrel (PLAVIX) 75 MG tablet, Take 1 tablet (75 mg total) by mouth daily., Disp: 90 tablet, Rfl: 0 .  furosemide (LASIX) 40 MG tablet, Take 0.5 tablets (20 mg total) by mouth daily as needed., Disp: 90 tablet, Rfl: 1 .  isosorbide mononitrate (IMDUR) 60 MG 24 hr tablet, Take 1 tablet (60 mg total) by mouth daily., Disp:  30 tablet, Rfl: 1 .  ketoconazole (NIZORAL) 2 % cream, Apply topically daily behind ears when sore, as directed, Disp: 15 g, Rfl: 0 .  levothyroxine (SYNTHROID, LEVOTHROID) 50 MCG tablet, Take 1 Tablet by mouth once daily before breakfast, Disp: 90 tablet, Rfl: 1 .  nitroGLYCERIN (NITROSTAT) 0.4 MG SL tablet, Place 1 tablet (0.4 mg total) under the tongue every 5 (five) minutes as needed for chest pain., Disp: 25 tablet, Rfl: 4 .  pravastatin (PRAVACHOL) 10 MG tablet, Take 1 tablet (10 mg total) by mouth every evening., Disp: 30 tablet, Rfl: 11 .  triamcinolone (KENALOG) 0.025 % ointment, Apply 1 application topically 2 (two) times daily., Disp: 30 g, Rfl: 0 Social History   Socioeconomic History  . Marital status: Married     Spouse name: Not on file  . Number of children: Not on file  . Years of education: Not on file  . Highest education level: Not on file  Occupational History  . Occupation: RETIRED    Employer: RETIRED    Comment: [employer]  . Financial resource strain: Not on file  . Food insecurity:    Worry: Not on file    Inability: Not on file  . Transportation needs:    Medical: Not on file    Non-medical: Not on file  Tobacco Use  . Smoking status: Never Smoker  . Smokeless tobacco: Never Used  Substance and Sexual Activity  . Alcohol use: No  . Drug use: No  . Sexual activity: Never  Lifestyle  . Physical activity:    Days per week: Not on file    Minutes per session: Not on file  . Stress: Not on file  Relationships  . Social connections:    Talks on phone: Not on file    Gets together: Not on file    Attends religious service: Not on file    Active member of club or organization: Not on file    Attends meetings of clubs or organizations: Not on file    Relationship status: Not on file  . Intimate partner violence:    Fear of current or ex partner: Not on file    Emotionally abused: Not on file    Physically abused: Not on file    Forced sexual activity: Not on file  Other Topics Concern  . Not on file  Social History Narrative   Lives at home with husband.     Family History  Problem Relation Age of Onset  . Heart failure Mother 75  . Rheum arthritis Father        prostate  . Prostate cancer Unknown   . Coronary artery disease Brother 60       CABG, alive at 43  . Coronary artery disease Brother 72       Died  . Heart failure Sister   . Heart failure Sister     Objective: Office vital signs reviewed. BP 138/75   Pulse 94   Temp (!) 97.4 F (36.3 C) (Oral)   Ht '5\' 4"'$  (1.626 m)   Wt 126 lb 3.2 oz (57.2 kg)   SpO2 98%   BMI 21.66 kg/m   Physical Examination:  General: Awake, alert, frail appearing female, No acute  distress HEENT: Normal    Neck: No masses palpated. No lymphadenopathy; no JVD noted.    Throat: moist mucus membranes Cardio: regular rate and rhythm, S1S2 heard, no murmurs appreciated Pulm: Fair air movement.  Clear to auscultation bilaterally, no wheezes, rhonchi or rales; normal work of breathing on room air Extremities: Patient has 1-2+ pitting edema bilaterally to mid shins.  Left does seem to be slightly more edematous than the right.  Dg Chest 2 View  Result Date: 01/14/2018 CLINICAL DATA:  Short of breath, lower extremity edema, chest pain EXAM: CHEST - 2 VIEW COMPARISON:  Chest x-ray of 10/26/2017 FINDINGS: The lungs are not well aerated. Also there has been and increase in volume of bilateral pleural effusions with increase in bibasilar atelectasis. There does appear to be mild pulmonary vascular congestion present. Cardiomegaly is stable. There is moderate thoracic aortic atherosclerosis noted. No acute bony abnormality is present. IMPRESSION: 1. Diminished aeration with increasing volume of bilateral pleural effusions and increasing bibasilar atelectasis. 2. Cardiomegaly and probable mild pulmonary vascular congestion. Electronically Signed   By: Ivar Drape M.D.   On: 01/14/2018 11:48   Assessment/ Plan: 84 y.o. female   1. Acute on chronic systolic congestive heart failure [facility]) Patient with stable vital signs, including pulse ox on room air.  She did not have increased work of breathing on exam.  Physical exam notable for +2 pitting edema bilaterally.  While her lung exam did not demonstrate significant rales or rhonchi, her symptoms and pitting edema were very suggestive of a CHF exacerbation.  Chest x-ray was obtained which upon my personal review did demonstrate bilateral pleural effusions that seemed increased compared to her previous chest x-ray.  Radiology review also appreciated this finding.  I discussed with her daughter and the patient that Lasix should be increased to 40  mg p.o. twice daily for the next couple of days.  We will plan to check CMP and BNP today.  I will contact her with the results of this tomorrow.  Plan for follow-up on Monday.  Home care instructions were reviewed.  Reasons for reevaluation and emergent evaluation discussed with the daughter.  She voiced good understanding and will follow-up on Monday as recommended. - CMP14+EGFR - Brain natriuretic peptide  2. Shortness of breath - DG Chest 2 View; Future - CMP14+EGFR - Brain natriuretic peptide    Orders Placed This Encounter  Procedures  . DG Chest 2 View    Standing Status:   Future    Number of Occurrences:   1    Standing Expiration Date:   03/17/2019    Order Specific Question:   Reason for Exam (SYMPTOM  OR DIAGNOSIS REQUIRED)    Answer:   SOB.  LE edema. hx of CHF/ MI.    Order Specific Question:   Preferred imaging location?    Answer:   Internal    Order Specific Question:   Radiology Contrast Protocol - do NOT remove file path    Answer:   [email or web address]  . CMP14+EGFR  . Brain natriuretic peptide    Janora Norlander, DO [place] [phone]

## 2018-01-14 NOTE — Telephone Encounter (Signed)
I will send a very small quantity of Ativan.  Please have her use this SPARINGLY.  Caution sedation and respiratory depression in this elderly female with multiple comorbidities.

## 2018-01-14 NOTE — Telephone Encounter (Signed)
Please advise 

## 2018-01-14 NOTE — Progress Notes (Signed)
The Narcotic Database has been reviewed.  There were no red flags.  No recent data found.  A small amount of Ativan 0.5 mg was sent to the pharmacy for patient's anxiety mediated symptoms.  Her chest x-ray was notable for increased pleural effusions bilaterally, which I do attribute to CHF exacerbation.  During today's visit, she was instructed to increase her Lasix dosing to 40 mg twice daily for the weekend.  Home care instructions were reviewed during that visit and reasons for emergent evaluation in the emergency department reviewed at that visit.  We had reviewed use of benzodiazepines in the elderly, especially given her multiple comorbidities.  Her daughter understood the risks and accepted this.  Phelicia Dantes M. Lajuana Ripple, [facility]

## 2018-01-14 NOTE — Telephone Encounter (Signed)
Pt daughter is calling stating that she is wanting Dr Lajuana Ripple to go ahead and send some anxiety medication in to the pharmacy, [facility]

## 2018-01-14 NOTE — Patient Instructions (Addendum)
You had a chest x-ray performed today which revealed fluid in the lungs.  Take Lasix 2 times per day over the weekend.  Follow up with me on Monday for recheck  You had labs drawn today.  We are checking a BNP to evaluate fluid status.  I will contact you with results of these labs tomorrow.  Pending these labs, we discussed consideration for a small amount of Ativan only to be given under supervision.  We did discuss the increased risks that are associated with benzodiazepine use, particularly in this age group with multiple comorbidities.  You understood these risks.   Heart Failure Heart failure means your heart has trouble pumping blood. This makes it hard for your body to work well. Heart failure is usually a long-term (chronic) condition. You must take good care of yourself and follow your doctor's treatment plan. Follow these instructions at home:  Take your heart medicine as told by your doctor. ? Do not stop taking medicine unless your doctor tells you to. ? Do not skip any dose of medicine. ? Refill your medicines before they run out. ? Take other medicines only as told by your doctor or pharmacist.  Stay active if told by your doctor. The elderly and people with severe heart failure should talk with a doctor about physical activity.  Eat heart-healthy foods. Choose foods that are without trans fat and are low in saturated fat, cholesterol, and salt (sodium). This includes fresh or frozen fruits and vegetables, fish, lean meats, fat-free or low-fat dairy foods, whole grains, and high-fiber foods. Lentils and dried peas and beans (legumes) are also good choices.  Limit salt if told by your doctor.  Cook in a healthy way. Roast, grill, broil, bake, poach, steam, or stir-fry foods.  Limit fluids as told by your doctor.  Weigh yourself every morning. Do this after you pee (urinate) and before you eat breakfast. Write down your weight to give to your doctor.  Take your blood  pressure and write it down if your doctor tells you to.  Ask your doctor how to check your pulse. Check your pulse as told.  Lose weight if told by your doctor.  Stop smoking or chewing tobacco. Do not use gum or patches that help you quit without your doctor's approval.  Schedule and go to doctor visits as told.  Nonpregnant women should have no more than 1 drink a day. Men should have no more than 2 drinks a day. Talk to your doctor about drinking alcohol.  Stop illegal drug use.  Stay current with shots (immunizations).  Manage your health conditions as told by your doctor.  Learn to manage your stress.  Rest when you are tired.  If it is really hot outside: ? Avoid intense activities. ? Use air conditioning or fans, or get in a cooler place. ? Avoid caffeine and alcohol. ? Wear loose-fitting, lightweight, and light-colored clothing.  If it is really cold outside: ? Avoid intense activities. ? Layer your clothing. ? Wear mittens or gloves, a hat, and a scarf when going outside. ? Avoid alcohol.  Learn about heart failure and get support as needed.  Get help to maintain or improve your quality of life and your ability to care for yourself as needed. Contact a doctor if:  You gain weight quickly.  You are more short of breath than usual.  You cannot do your normal activities.  You tire easily.  You cough more than normal, especially with activity.  You have any or more puffiness (swelling) in areas such as your hands, feet, ankles, or belly (abdomen).  You cannot sleep because it is hard to breathe.  You feel like your heart is beating fast (palpitations).  You get dizzy or light-headed when you stand up. Get help right away if:  You have trouble breathing.  There is a change in mental status, such as becoming less alert or not being able to focus.  You have chest pain or discomfort.  You faint. This information is not intended to replace advice given  to you by your health care provider. Make sure you discuss any questions you have with your health care provider. Document Released: 06/16/2008 Document Revised: 02/13/2016 Document Reviewed: 10/24/2012 Elsevier Interactive Patient Education  2017 [employer].

## 2018-01-15 ENCOUNTER — Other Ambulatory Visit: Payer: Self-pay

## 2018-01-15 ENCOUNTER — Emergency Department (HOSPITAL_COMMUNITY): Payer: Medicare Other

## 2018-01-15 ENCOUNTER — Inpatient Hospital Stay (HOSPITAL_COMMUNITY)
Admission: EM | Admit: 2018-01-15 | Discharge: 2018-01-18 | DRG: 291 | Disposition: A | Discharge status: Home / Self Care | Payer: Medicare Other | Attending: Family Medicine | Admitting: Family Medicine

## 2018-01-15 ENCOUNTER — Telehealth: Payer: Self-pay | Admitting: Family Medicine

## 2018-01-15 ENCOUNTER — Encounter (HOSPITAL_COMMUNITY): Payer: Self-pay | Admitting: Emergency Medicine

## 2018-01-15 DIAGNOSIS — Z7982 Long term (current) use of aspirin: Secondary | ICD-10-CM

## 2018-01-15 DIAGNOSIS — N183 Chronic kidney disease, stage 3 unspecified: Secondary | ICD-10-CM | POA: Diagnosis present

## 2018-01-15 DIAGNOSIS — F4320 Adjustment disorder, unspecified: Secondary | ICD-10-CM | POA: Diagnosis present

## 2018-01-15 DIAGNOSIS — I5043 Acute on chronic combined systolic (congestive) and diastolic (congestive) heart failure: Secondary | ICD-10-CM | POA: Diagnosis present

## 2018-01-15 DIAGNOSIS — I509 Heart failure, unspecified: Secondary | ICD-10-CM

## 2018-01-15 DIAGNOSIS — M7989 Other specified soft tissue disorders: Secondary | ICD-10-CM | POA: Diagnosis not present

## 2018-01-15 DIAGNOSIS — E785 Hyperlipidemia, unspecified: Secondary | ICD-10-CM | POA: Diagnosis not present

## 2018-01-15 DIAGNOSIS — Z7902 Long term (current) use of antithrombotics/antiplatelets: Secondary | ICD-10-CM

## 2018-01-15 DIAGNOSIS — R600 Localized edema: Secondary | ICD-10-CM | POA: Diagnosis not present

## 2018-01-15 DIAGNOSIS — I110 Hypertensive heart disease with heart failure: Secondary | ICD-10-CM | POA: Diagnosis not present

## 2018-01-15 DIAGNOSIS — I2510 Atherosclerotic heart disease of native coronary artery without angina pectoris: Secondary | ICD-10-CM | POA: Diagnosis not present

## 2018-01-15 DIAGNOSIS — I130 Hypertensive heart and chronic kidney disease with heart failure and stage 1 through stage 4 chronic kidney disease, or unspecified chronic kidney disease: Principal | ICD-10-CM | POA: Diagnosis present

## 2018-01-15 DIAGNOSIS — R0602 Shortness of breath: Secondary | ICD-10-CM | POA: Diagnosis not present

## 2018-01-15 DIAGNOSIS — Z9861 Coronary angioplasty status: Secondary | ICD-10-CM

## 2018-01-15 DIAGNOSIS — Z9114 Patient's other noncompliance with medication regimen: Secondary | ICD-10-CM

## 2018-01-15 DIAGNOSIS — I5021 Acute systolic (congestive) heart failure: Secondary | ICD-10-CM | POA: Diagnosis present

## 2018-01-15 DIAGNOSIS — Z8249 Family history of ischemic heart disease and other diseases of the circulatory system: Secondary | ICD-10-CM

## 2018-01-15 DIAGNOSIS — E039 Hypothyroidism, unspecified: Secondary | ICD-10-CM | POA: Diagnosis not present

## 2018-01-15 DIAGNOSIS — R0600 Dyspnea, unspecified: Secondary | ICD-10-CM

## 2018-01-15 DIAGNOSIS — Z79890 Hormone replacement therapy (postmenopausal): Secondary | ICD-10-CM

## 2018-01-15 DIAGNOSIS — I5023 Acute on chronic systolic (congestive) heart failure: Secondary | ICD-10-CM | POA: Diagnosis not present

## 2018-01-15 DIAGNOSIS — Z79899 Other long term (current) drug therapy: Secondary | ICD-10-CM

## 2018-01-15 DIAGNOSIS — F419 Anxiety disorder, unspecified: Secondary | ICD-10-CM | POA: Diagnosis present

## 2018-01-15 DIAGNOSIS — T501X6A Underdosing of loop [high-ceiling] diuretics, initial encounter: Secondary | ICD-10-CM | POA: Diagnosis present

## 2018-01-15 DIAGNOSIS — Z91128 Patient's intentional underdosing of medication regimen for other reason: Secondary | ICD-10-CM

## 2018-01-15 DIAGNOSIS — I252 Old myocardial infarction: Secondary | ICD-10-CM

## 2018-01-15 LAB — URINALYSIS, ROUTINE W REFLEX MICROSCOPIC
BILIRUBIN URINE: NEGATIVE
Bacteria, UA: NONE SEEN
Glucose, UA: NEGATIVE mg/dL
HGB URINE DIPSTICK: NEGATIVE
KETONES UR: NEGATIVE mg/dL
Nitrite: NEGATIVE
Protein, ur: NEGATIVE mg/dL
Specific Gravity, Urine: 1.008 (ref 1.005–1.030)
pH: 7 (ref 5.0–8.0)

## 2018-01-15 LAB — BASIC METABOLIC PANEL
ANION GAP: 11 (ref 5–15)
BUN: 32 mg/dL — ABNORMAL HIGH (ref 6–20)
CO2: 29 mmol/L (ref 22–32)
Calcium: 8.4 mg/dL — ABNORMAL LOW (ref 8.9–10.3)
Chloride: 96 mmol/L — ABNORMAL LOW (ref 101–111)
Creatinine, Ser: 1.25 mg/dL — ABNORMAL HIGH (ref 0.44–1.00)
GFR calc non Af Amer: 35 mL/min — ABNORMAL LOW (ref >60)
GFR, EST AFRICAN AMERICAN: 40 mL/min — AB (ref >60)
Glucose, Bld: 131 mg/dL — ABNORMAL HIGH (ref 65–99)
POTASSIUM: 3.7 mmol/L (ref 3.5–5.1)
SODIUM: 136 mmol/L (ref 135–145)

## 2018-01-15 LAB — CBC
HEMATOCRIT: 37.7 % (ref 36.0–46.0)
HEMOGLOBIN: 11.4 g/dL — AB (ref 12.0–15.0)
MCH: 25.1 pg — ABNORMAL LOW (ref 26.0–34.0)
MCHC: 30.2 g/dL (ref 30.0–36.0)
MCV: 82.9 fL (ref 78.0–100.0)
Platelets: 229 10*3/uL (ref 150–400)
RBC: 4.55 MIL/uL (ref 3.87–5.11)
RDW: 16.4 % — ABNORMAL HIGH (ref 11.5–15.5)
WBC: 8.1 10*3/uL (ref 4.0–10.5)

## 2018-01-15 LAB — I-STAT TROPONIN, ED: Troponin i, poc: 0.03 ng/mL (ref 0.00–0.08)

## 2018-01-15 LAB — D-DIMER, QUANTITATIVE (NOT AT ARMC): D DIMER QUANT: 0.99 ug{FEU}/mL — AB (ref 0.00–0.50)

## 2018-01-15 LAB — BRAIN NATRIURETIC PEPTIDE: BNP: 2174.6 pg/mL — ABNORMAL HIGH (ref 0.0–100.0)

## 2018-01-15 MED ORDER — ACETAMINOPHEN 650 MG RE SUPP: 650.0000 mg | Freq: Four times a day (QID) | RECTAL | Status: DC | PRN

## 2018-01-15 MED ORDER — CARVEDILOL 3.125 MG PO TABS
3.1250 mg | ORAL_TABLET | Freq: Two times a day (BID) | ORAL | Status: DC
  Administered 2018-01-16 – 2018-01-18 (×4): 3.125 mg via ORAL
  Filled 2018-01-15 (×4): qty 1

## 2018-01-15 MED ORDER — SODIUM CHLORIDE 0.9 % IV SOLN: 250.0000 mL | INTRAVENOUS | Status: DC | PRN

## 2018-01-15 MED ORDER — LORAZEPAM 0.5 MG PO TABS
0.2500 mg | ORAL_TABLET | Freq: Two times a day (BID) | ORAL | Status: DC | PRN
  Administered 2018-01-16: 0.5 mg via ORAL
  Administered 2018-01-16 – 2018-01-17 (×2): 0.25 mg via ORAL
  Filled 2018-01-15 (×3): qty 1

## 2018-01-15 MED ORDER — CLOPIDOGREL BISULFATE 75 MG PO TABS
75.0000 mg | ORAL_TABLET | Freq: Every day | ORAL | Status: DC
  Administered 2018-01-16 – 2018-01-18 (×3): 75 mg via ORAL
  Filled 2018-01-15 (×3): qty 1

## 2018-01-15 MED ORDER — SODIUM CHLORIDE 0.9% FLUSH: 3.0000 mL | INTRAVENOUS | Status: DC | PRN

## 2018-01-15 MED ORDER — NITROGLYCERIN 0.4 MG SL SUBL: 0.4000 mg | SUBLINGUAL_TABLET | SUBLINGUAL | Status: DC | PRN

## 2018-01-15 MED ORDER — ACETAMINOPHEN 325 MG PO TABS: 650.0000 mg | ORAL_TABLET | Freq: Four times a day (QID) | ORAL | Status: DC | PRN

## 2018-01-15 MED ORDER — ASPIRIN EC 81 MG PO TBEC
81.0000 mg | DELAYED_RELEASE_TABLET | Freq: Every day | ORAL | Status: DC
  Administered 2018-01-16 – 2018-01-18 (×3): 81 mg via ORAL
  Filled 2018-01-15 (×3): qty 1

## 2018-01-15 MED ORDER — ONDANSETRON HCL 4 MG PO TABS: 4.0000 mg | ORAL_TABLET | Freq: Four times a day (QID) | ORAL | Status: DC | PRN

## 2018-01-15 MED ORDER — HEPARIN SODIUM (PORCINE) 5000 UNIT/ML IJ SOLN
5000.0000 [IU] | Freq: Three times a day (TID) | INTRAMUSCULAR | Status: DC
  Administered 2018-01-16 – 2018-01-18 (×6): 5000 [IU] via SUBCUTANEOUS
  Filled 2018-01-15 (×6): qty 1

## 2018-01-15 MED ORDER — LEVOTHYROXINE SODIUM 50 MCG PO TABS
50.0000 ug | ORAL_TABLET | Freq: Every day | ORAL | Status: DC
  Administered 2018-01-16 – 2018-01-18 (×3): 50 ug via ORAL
  Filled 2018-01-15 (×3): qty 1

## 2018-01-15 MED ORDER — SODIUM CHLORIDE 0.9% FLUSH
3.0000 mL | Freq: Two times a day (BID) | INTRAVENOUS | Status: DC
  Administered 2018-01-16 – 2018-01-18 (×6): 3 mL via INTRAVENOUS

## 2018-01-15 MED ORDER — ISOSORBIDE MONONITRATE ER 30 MG PO TB24
30.0000 mg | ORAL_TABLET | Freq: Every day | ORAL | Status: DC
  Administered 2018-01-16 – 2018-01-18 (×3): 30 mg via ORAL
  Filled 2018-01-15 (×3): qty 1

## 2018-01-15 MED ORDER — FUROSEMIDE 10 MG/ML IJ SOLN
40.0000 mg | Freq: Two times a day (BID) | INTRAMUSCULAR | Status: DC
  Administered 2018-01-16 – 2018-01-17 (×2): 40 mg via INTRAVENOUS
  Filled 2018-01-15 (×2): qty 4

## 2018-01-15 MED ORDER — FUROSEMIDE 10 MG/ML IJ SOLN
40.0000 mg | Freq: Once | INTRAMUSCULAR | Status: AC
  Administered 2018-01-15: 40 mg via INTRAVENOUS
  Filled 2018-01-15: qty 4

## 2018-01-15 MED ORDER — ONDANSETRON HCL 4 MG/2ML IJ SOLN: 4.0000 mg | Freq: Four times a day (QID) | INTRAMUSCULAR | Status: DC | PRN

## 2018-01-15 NOTE — ED Notes (Signed)
Pt 84 yr old   Complained of SOB x 1 week   Seen by [facility], labs drawn  Ccalled today that BNP greater than 2000

## 2018-01-15 NOTE — ED Notes (Signed)
Unsuccessful IV attempt x2.  

## 2018-01-15 NOTE — Telephone Encounter (Signed)
I called to discuss [name]'s results with her caregiver, [name] we reviewed the elevation in her creatinine as well as her significantly elevated BNP.  I highly stressed that she should seek immediate medical attention in the emergency department as she will likely need IV diuresis and possibly an admission for congestive heart failure.  [name] did note that [name] is extremely reluctant to go to the emergency department, despite shortness of breath and known bilateral pleural effusions.  I stressed that her fluid overload could indeed be life-threatening and that she should go to the emergency department immediately.  She voiced good understanding.  [name] M. Lajuana Ripple, [facility]

## 2018-01-15 NOTE — ED Provider Notes (Signed)
[facility] EMERGENCY DEPARTMENT Provider Note   CSN: [id number] Arrival date & time: 01/15/18  1924     History   Chief Complaint Chief Complaint  Patient presents with  . Congestive Heart Failure    HPI Lori [name] is a 84 y.o. female.  He is here at the recommendation of her PCP, who checked her yesterday for shortness of breath.  Labs were done, and her BNP was elevated.  She is also reported to have pulmonary effusions.  Patient has history of congestive heart failure and has been taking her usual medications.  Last echocardiogram, February 2019, ejection fraction 30%.  BNP yesterday 2174, and the creatinine 1.2.  Creatinine is near baseline.  No BNP available for trending.  Patient here with son and daughter who give most of the history.  Patient's husband died about a month ago, after an illness, and stayed in a hospice facility.  Apparently patient injured her left lower leg, while he was at hospice, and has had persistent pain and swelling in that leg since.  Patient has also gained 6 pounds over the last several weeks.  She does not eat or drink much.  She has told her children that she has chest pain sometimes but it is difficult to qualify or quantify.  Patient does state that she does not have chest pain at the time of ED evaluation.  There is been no vomiting, coughing, change in bowel or urinary habits.  She been sleeping in a recliner because of a sensation of shortness of breath with being supine.  There are no other known modifying factors.  HPI  Past Medical History:  Diagnosis Date  . Arthritis   . CAD (coronary artery disease)    a. NSTEMI in 2011 s/p PCI/DES to distal Cx, s/p PCI/DES to OM2 (EF 40%). b. NSTEMI 02/2012 treated conservatively.  . Carotid artery stenosis    Left carotid endarterectomy 02/2001  . CHF (congestive heart failure) ([place])    a. EF 40% in 2011. b. EF 35-40% by echo 03/12/12.  [name] Kitchen Hyperlipidemia   . Hypertension   . Hyponatremia    Noted on 02/2012 admission  . Pneumonia    02/2012  . Renal insufficiency    Noted on 02/2012 admission  . Respiratory failure ([place])    02/2012 - requiring bipap - multifactorial in setting of PNA/CHF/NSTEMI  . Thyroid disease    Euthyroid 02/2012    Patient Active Problem List   Diagnosis Date Noted  . Angina pectoris ([place]) 10/26/2017  . Chronic systolic CHF (congestive heart failure) ([place]) 10/26/2017  . CKD (chronic kidney disease), stage III ([place]) 10/26/2017  . Chronic combined systolic and diastolic CHF (congestive heart failure) ([place]) 10/26/2017  . Pressure injury of skin 07/02/2017  . Unstable angina ([place]) 06/30/2017  . Hypothyroidism 08/03/2016  . Venous (peripheral) insufficiency 03/19/2016  . Vertebrobasilar insufficiency 10/08/2015  . Cervical spondylosis without myelopathy 10/08/2015  . Carotid sinus syncope 10/08/2015  . History of Clostridium difficile 10/01/2014  . Hemorrhoids 06/11/2014  . Ileus ([place]) 08/08/2013  . Spinal stenosis of lumbar region 05/26/2013  . Chronic systolic heart failure ([place]) 03/21/2012  . Ischemic cardiomyopathy 03/21/2012  . CKD (chronic kidney disease) 03/21/2012  . NSTEMI (non-ST elevated myocardial infarction) ([place]) 03/12/2012  . Dyslipidemia 12/10/2010  . CORONARY ATHEROSCLEROSIS NATIVE CORONARY ARTERY 08/27/2010  . Accelerated hypertension 08/26/2010  . CAROTID STENOSIS 08/26/2010    Past Surgical History:  Procedure Laterality Date  . CARDIAC CATHETERIZATION    .  CAROTID ENDARTERECTOMY     left 02/2001     OB History    Gravida  2   Para  2   Term  2   Preterm      AB      Living  2     SAB      TAB      Ectopic      Multiple      Live Births               Home Medications    Prior to Admission medications   Medication Sig Start Date End Date Taking? Authorizing Provider  aspirin 81 MG tablet Take 81 mg by mouth daily.     Yes [provider]  carvedilol (COREG) 3.125 MG tablet TAKE 1  TABLET BY MOUTH 2 TIMES A DAY WITH MEALS 11/22/17  Yes Minus Breeding, MD  clopidogrel (PLAVIX) 75 MG tablet Take 1 tablet (75 mg total) by mouth daily. 12/30/17  Yes Eustaquio Maize, MD  furosemide (LASIX) 40 MG tablet Take 0.5 tablets (20 mg total) by mouth daily as needed. Patient taking differently: Take 40 mg by mouth 2 (two) times daily.  11/08/17  Yes Eustaquio Maize, MD  isosorbide mononitrate (IMDUR) 60 MG 24 hr tablet Take 1 tablet (60 mg total) by mouth daily. Patient taking differently: Take 30 mg by mouth daily.  10/28/17  Yes Kathie Dike, MD  levothyroxine (SYNTHROID, LEVOTHROID) 50 MCG tablet Take 1 Tablet by mouth once daily before breakfast 09/08/17  Yes Eustaquio Maize, MD  LORazepam (ATIVAN) 0.5 MG tablet Take 0.5-1 tablets (0.25-0.5 mg total) by mouth 2 (two) times daily as needed for anxiety. 01/14/18  Yes Gottschalk, Leatrice Jewels M, DO  nitroGLYCERIN (NITROSTAT) 0.4 MG SL tablet Place 1 tablet (0.4 mg total) under the tongue every 5 (five) minutes as needed for chest pain. 12/21/17  Yes Minus Breeding, MD    Family History Family History  Problem Relation Age of Onset  . Heart failure Mother 73  . Rheum arthritis Father        prostate  . Prostate cancer Unknown   . Coronary artery disease Brother 37       CABG, alive at 9  . Coronary artery disease Brother 7       Died  . Heart failure Sister   . Heart failure Sister     Social History Social History   Tobacco Use  . Smoking status: Never Smoker  . Smokeless tobacco: Never Used  Substance Use Topics  . Alcohol use: No  . Drug use: No     Allergies   Patient has no known allergies.   Review of Systems Review of Systems  All other systems reviewed and are negative.    Physical Exam Updated Vital Signs BP (!) 104/54   Pulse 80   Temp (!) 97.4 F (36.3 C) (Oral)   Resp (!) 26   Ht 5\' 4"  (1.626 m)   Wt 57.2 kg (126 lb)   SpO2 97%   BMI 21.63 kg/m   Physical Exam  Constitutional: She is  oriented to person, place, and time. She appears well-developed. No distress.  Elderly, frail  HENT:  Head: Normocephalic and atraumatic.  Right Ear: External ear normal.  Left Ear: External ear normal.  Eyes: Pupils are equal, round, and reactive to light. Conjunctivae and EOM are normal.  Neck: Normal range of motion and phonation normal. Neck supple.  Cardiovascular: Normal rate,  regular rhythm and normal heart sounds.  Pulmonary/Chest: Effort normal. No stridor. No respiratory distress (No increased work of breathing.). She has no wheezes. She has no rales. She exhibits no bony tenderness.  Breath sounds decreased at bases bilaterally.  Abdominal: Soft. There is no tenderness.  Musculoskeletal: Normal range of motion. She exhibits edema (Left lower leg, mild).  Neurological: She is alert and oriented to person, place, and time. No cranial nerve deficit or sensory deficit. She exhibits normal muscle tone. Coordination normal.  Skin: Skin is warm, dry and intact.  Healing abrasion left anterior shin without bleeding, drainage or fluctuance.  Psychiatric: She has a normal mood and affect. Her behavior is normal.  Nursing note and vitals reviewed.    ED Treatments / Results  Labs (all labs ordered are listed, but only abnormal results are displayed) Labs Reviewed  BASIC METABOLIC PANEL - Abnormal; Notable for the following components:      Result Value   Chloride 96 (*)    Glucose, Bld 131 (*)    BUN 32 (*)    Creatinine, Ser 1.25 (*)    Calcium 8.4 (*)    GFR calc non Af Amer 35 (*)    GFR calc Af Amer 40 (*)    All other components within normal limits  CBC - Abnormal; Notable for the following components:   Hemoglobin 11.4 (*)    MCH 25.1 (*)    RDW 16.4 (*)    All other components within normal limits  D-DIMER, QUANTITATIVE (NOT AT [facility]) - Abnormal; Notable for the following components:   D-Dimer, Quant 0.99 (*)    All other components within normal limits  I-STAT  TROPONIN, ED    EKG EKG Interpretation  Date/Time:  Saturday January 15 2018 20:46:59 EDT Ventricular Rate:  81 PR Interval:    QRS Duration: 100 QT Interval:  428 QTC Calculation: 497 R Axis:   80 Text Interpretation:  Sinus rhythm Ventricular premature complex Probable left atrial enlargement Since last tracing pvc is new Confirmed by Daleen Bo [phone]) on 01/15/2018 9:11:23 PM   Radiology Dg Chest 2 View  Result Date: 01/15/2018 CLINICAL DATA:  Initial evaluation for acute shortness of breath. EXAM: CHEST - 2 VIEW COMPARISON:  Prior radiograph from 01/14/2018. FINDINGS: Stable cardiomegaly. Mediastinal silhouette normal. Aortic atherosclerosis. Lungs hypoinflated. Small moderate bilateral pleural effusions, right greater than left. Associated bibasilar opacities favored to reflect atelectatic changes. Increased vascular congestion with interstitial prominence as compared to previous, suggesting developing pulmonary interstitial edema. No other focal airspace disease. No pneumothorax. Osseous structures are unchanged. IMPRESSION: 1. Cardiomegaly with slightly worsened diffuse pulmonary vascular congestion and interstitial prominence, suggesting mild and/or developing pulmonary interstitial edema. 2. Small moderate bilateral pleural effusions with associated atelectasis, right greater than left. 3. Aortic atherosclerosis. Electronically Signed   By: Jeannine Boga M.D.   On: 01/15/2018 20:58   Dg Chest 2 View  Result Date: 01/14/2018 CLINICAL DATA:  Short of breath, lower extremity edema, chest pain EXAM: CHEST - 2 VIEW COMPARISON:  Chest x-ray of 10/26/2017 FINDINGS: The lungs are not well aerated. Also there has been and increase in volume of bilateral pleural effusions with increase in bibasilar atelectasis. There does appear to be mild pulmonary vascular congestion present. Cardiomegaly is stable. There is moderate thoracic aortic atherosclerosis noted. No acute bony abnormality is  present. IMPRESSION: 1. Diminished aeration with increasing volume of bilateral pleural effusions and increasing bibasilar atelectasis. 2. Cardiomegaly and probable mild pulmonary vascular congestion. Electronically Signed  By: Ivar Drape M.D.   On: 01/14/2018 11:48    Procedures Procedures (including critical care time)  Medications Ordered in ED Medications  furosemide (LASIX) injection 40 mg (40 mg Intravenous Given 01/15/18 2059)     Initial Impression / Assessment and Plan / ED Course  I have reviewed the triage vital signs and the nursing notes.  Pertinent labs & imaging results that were available during my care of the patient were reviewed by me and considered in my medical decision making (see chart for details).  Clinical Course as of Jan 15 2209  Sat Jan 15, 2018  2021 DG Chest 2 View [EW]  2021    DG Chest 2 View [EW]  2205 Normal  I-stat troponin, ED [EW]    Clinical Course User Index [EW] Daleen Bo, MD     Patient Vitals for the past 24 hrs:  BP Temp Temp src Pulse Resp SpO2 Height Weight  01/15/18 2145 - - - 80 (!) 26 97 % - -  01/15/18 2100 (!) 104/54 - - 77 20 95 % - -  01/15/18 2047 (!) 121/58 - - 79 (!) 24 95 % - -  01/15/18 2030 - - - 80 19 96 % - -  01/15/18 2000 (!) 121/58 - - 90 16 95 % - -  01/15/18 1943 (!) 106/56 (!) 97.4 F (36.3 C) Oral 86 18 95 % 5\' 4"  (1.626 m) 57.2 kg (126 lb)    10:01 PM Reevaluation with update and discussion. After initial assessment and treatment, an updated evaluation reveals no change in clinical status.  Patient family members updated on findings and plan. [place] decision making-patient with outpatient findings for worsening congestive heart failure, ejection fraction 30 last checked earlier this year.  Elevated BNP without comparison lab available.  Hemodynamic stable.  EKG and troponin do not indicate ischemia or infarct.  Mild elevation of d-dimer, likely normal when adjusted for age.  Patient  requires admission for further evaluation based on symptom complex and patient's family concern.  Nursing Notes Reviewed/ Care Coordinated Applicable Imaging Reviewed Interpretation of Laboratory Data incorporated into ED treatment  10:08 PM-Consult complete with hospitalist. Patient case explained and discussed.  He agrees to admit patient for further evaluation and treatment. Call ended at 10:20 PM   Plan: Admit  Final Clinical Impressions(s) / ED Diagnoses   Final diagnoses:  Acute on chronic congestive heart failure, unspecified heart failure type ([place])  Left leg swelling    ED Discharge Orders    None       Daleen Bo, MD 01/15/18 2220

## 2018-01-15 NOTE — ED Triage Notes (Signed)
Patient seen by MD yesterday. Family called and told BNP 2000. Told to come to ED.

## 2018-01-15 NOTE — ED Notes (Signed)
Dr Manuella Ghazi in to assess  Dr Chauncey Cruel informed that pt code status is unclear

## 2018-01-15 NOTE — H&P (Addendum)
History and Physical    [name] MHD:[id number] DOB: 29-Apr-1919 DOA: 01/15/2018  PCP: Eustaquio Maize, MD   Patient coming from: Home  Chief Complaint: Worsening dyspnea  HPI: Lori [name] is a 84 y.o. female with medical history significant for systolic congestive heart failure with EF 30 to 35% on echo 10/2017 as well as mild LVH and grade 1 diastolic dysfunction, CAD with recent NSTEMI treated conservatively, hypertension, dyslipidemia, CKD stage III, and hypothyroidism who is brought to the emergency department at the recommendation of her PCP who evaluated her due to shortness of breath.  Chest x-ray at that time demonstrated some development of pleural effusions and her Lasix dose was increased to oral 40 mg twice a day for a couple days.  She was also noted to be anxious and was given some anxiolytic medications.  She continued to have ongoing symptoms and an elevated BNP was noted on lab work which then prompted the ED visit.  Notably, patient has been somewhat noncompliant with her Lasix after her husband died about a month ago after a prolonged stay at a hospice facility.  She also injured her left leg around that time and has had persistent swelling in that leg.  She has also gained approximately 6 pounds over the last several weeks and has little to no appetite. She denies any chest pain, fever, chills, cough, abdominal pain, or vomiting.  She does have mild nausea on occasion.   ED Course: Vital signs are noted to be stable with some soft blood pressure readings.  Hemoglobin 11.4, BUN 32, creatinine 1.25 with baseline near 1.  BNP is 2174 as checked at her PCPs office and troponin is 0.03.  EKG shows sinus rhythm at 81 bpm with some left atrial enlargement.  Two-view chest x-ray with some cardiomegaly and bilateral pleural effusions with diffuse pulmonary vascular congestion.  D-dimer was checked and elevated at 0.99.  Review of Systems: As per HPI otherwise 10 point review of  systems negative.   Past Medical History:  Diagnosis Date  . Arthritis   . CAD (coronary artery disease)    a. NSTEMI in 2011 s/p PCI/DES to distal Cx, s/p PCI/DES to OM2 (EF 40%). b. NSTEMI 02/2012 treated conservatively.  . Carotid artery stenosis    Left carotid endarterectomy 02/2001  . CHF (congestive heart failure) ([place])    a. EF 40% in 2011. b. EF 35-40% by echo 03/12/12.  [name] Kitchen Hyperlipidemia   . Hypertension   . Hyponatremia    Noted on 02/2012 admission  . Pneumonia    02/2012  . Renal insufficiency    Noted on 02/2012 admission  . Respiratory failure ([place])    02/2012 - requiring bipap - multifactorial in setting of PNA/CHF/NSTEMI  . Thyroid disease    Euthyroid 02/2012    Past Surgical History:  Procedure Laterality Date  . CARDIAC CATHETERIZATION    . CAROTID ENDARTERECTOMY     left 02/2001     reports that she has never smoked. She has never used smokeless tobacco. She reports that she does not drink alcohol or use drugs.  No Known Allergies  Family History  Problem Relation Age of Onset  . Heart failure Mother 40  . Rheum arthritis Father        prostate  . Prostate cancer Unknown   . Coronary artery disease Brother 14       CABG, alive at 74  . Coronary artery disease Brother 31  Died  . Heart failure Sister   . Heart failure Sister     Prior to Admission medications   Medication Sig Start Date End Date Taking? Authorizing Provider  aspirin 81 MG tablet Take 81 mg by mouth daily.     Yes [provider]  carvedilol (COREG) 3.125 MG tablet TAKE 1 TABLET BY MOUTH 2 TIMES A DAY WITH MEALS 11/22/17  Yes Minus Breeding, MD  clopidogrel (PLAVIX) 75 MG tablet Take 1 tablet (75 mg total) by mouth daily. 12/30/17  Yes Eustaquio Maize, MD  furosemide (LASIX) 40 MG tablet Take 0.5 tablets (20 mg total) by mouth daily as needed. Patient taking differently: Take 40 mg by mouth 2 (two) times daily.  11/08/17  Yes Eustaquio Maize, MD  isosorbide mononitrate  (IMDUR) 60 MG 24 hr tablet Take 1 tablet (60 mg total) by mouth daily. Patient taking differently: Take 30 mg by mouth daily.  10/28/17  Yes Kathie Dike, MD  levothyroxine (SYNTHROID, LEVOTHROID) 50 MCG tablet Take 1 Tablet by mouth once daily before breakfast 09/08/17  Yes Eustaquio Maize, MD  LORazepam (ATIVAN) 0.5 MG tablet Take 0.5-1 tablets (0.25-0.5 mg total) by mouth 2 (two) times daily as needed for anxiety. 01/14/18  Yes Gottschalk, Leatrice Jewels M, DO  nitroGLYCERIN (NITROSTAT) 0.4 MG SL tablet Place 1 tablet (0.4 mg total) under the tongue every 5 (five) minutes as needed for chest pain. 12/21/17  Yes Minus Breeding, MD    Physical Exam: Vitals:   01/15/18 2100 01/15/18 2145 01/15/18 2200 01/15/18 2300  BP: (!) 104/54  (!) 120/56 (!) 119/57  Pulse: 77 80 80 80  Resp: 20 (!) 26 (!) 21 (!) 26  Temp:      TempSrc:      SpO2: 95% 97% 95% 97%  Weight:      Height:        Constitutional: NAD, calm, comfortable Vitals:   01/15/18 2100 01/15/18 2145 01/15/18 2200 01/15/18 2300  BP: (!) 104/54  (!) 120/56 (!) 119/57  Pulse: 77 80 80 80  Resp: 20 (!) 26 (!) 21 (!) 26  Temp:      TempSrc:      SpO2: 95% 97% 95% 97%  Weight:      Height:       Eyes: lids and conjunctivae normal ENMT: Mucous membranes are moist.  Neck: normal, supple Respiratory: clear to auscultation bilaterally. Normal respiratory effort. No accessory muscle use. Currently on RA. Cardiovascular: Regular rate and rhythm, no murmurs. No extremity edema. Abdomen: no tenderness, no distention. Bowel sounds positive.  Musculoskeletal:  No joint deformity upper and lower extremities.  Mild LLE edema; nonpitting. Skin: no rashes, lesions, ulcers.  Psychiatric: Normal judgment and insight. Alert and oriented x 3. Normal mood.   Labs on Admission: I have personally reviewed following labs and imaging studies  CBC: Recent Labs  Lab 01/15/18 2013  WBC 8.1  HGB 11.4*  HCT 37.7  MCV 82.9  PLT 269   Basic Metabolic  Panel: Recent Labs  Lab 01/14/18 1200 01/15/18 2013  NA 139 136  K 5.2 3.7  CL 99 96*  CO2 26 29  GLUCOSE 95 131*  BUN 23 32*  CREATININE 1.24* 1.25*  CALCIUM 8.7 8.4*   GFR: Estimated Creatinine Clearance: 22.2 mL/min (A) (by C-G formula based on SCr of 1.25 mg/dL (H)). Liver Function Tests: Recent Labs  Lab 01/14/18 1200  AST 74*  ALT 62*  ALKPHOS 138*  BILITOT 0.4  PROT 5.7*  ALBUMIN 3.7   No results for input(s): LIPASE, AMYLASE in the last 168 hours. No results for input(s): AMMONIA in the last 168 hours. Coagulation Profile: No results for input(s): INR, PROTIME in the last 168 hours. Cardiac Enzymes: No results for input(s): CKTOTAL, CKMB, CKMBINDEX, TROPONINI in the last 168 hours. BNP (last 3 results) No results for input(s): PROBNP in the last 8760 hours. HbA1C: No results for input(s): HGBA1C in the last 72 hours. CBG: No results for input(s): GLUCAP in the last 168 hours. Lipid Profile: No results for input(s): CHOL, HDL, LDLCALC, TRIG, CHOLHDL, LDLDIRECT in the last 72 hours. Thyroid Function Tests: No results for input(s): TSH, T4TOTAL, FREET4, T3FREE, THYROIDAB in the last 72 hours. Anemia Panel: No results for input(s): VITAMINB12, FOLATE, FERRITIN, TIBC, IRON, RETICCTPCT in the last 72 hours. Urine analysis:    Component Value Date/Time   COLORURINE YELLOW 10/25/2016 1847   APPEARANCEUR HAZY (A) 10/25/2016 1847   LABSPEC 1.009 10/25/2016 1847   PHURINE 6.0 10/25/2016 1847   GLUCOSEU NEGATIVE 10/25/2016 1847   HGBUR SMALL (A) 10/25/2016 1847   BILIRUBINUR NEGATIVE 10/25/2016 1847   BILIRUBINUR negative 10/08/2015 1725   KETONESUR NEGATIVE 10/25/2016 1847   PROTEINUR NEGATIVE 10/25/2016 1847   UROBILINOGEN negative 10/08/2015 1725   UROBILINOGEN 0.2 05/25/2013 1525   NITRITE NEGATIVE 10/25/2016 1847   LEUKOCYTESUR SMALL (A) 10/25/2016 1847    Radiological Exams on Admission: Dg Chest 2 View  Result Date: 01/15/2018 CLINICAL DATA:   Initial evaluation for acute shortness of breath. EXAM: CHEST - 2 VIEW COMPARISON:  Prior radiograph from 01/14/2018. FINDINGS: Stable cardiomegaly. Mediastinal silhouette normal. Aortic atherosclerosis. Lungs hypoinflated. Small moderate bilateral pleural effusions, right greater than left. Associated bibasilar opacities favored to reflect atelectatic changes. Increased vascular congestion with interstitial prominence as compared to previous, suggesting developing pulmonary interstitial edema. No other focal airspace disease. No pneumothorax. Osseous structures are unchanged. IMPRESSION: 1. Cardiomegaly with slightly worsened diffuse pulmonary vascular congestion and interstitial prominence, suggesting mild and/or developing pulmonary interstitial edema. 2. Small moderate bilateral pleural effusions with associated atelectasis, right greater than left. 3. Aortic atherosclerosis. Electronically Signed   By: Jeannine Boga M.D.   On: 01/15/2018 20:58   Dg Chest 2 View  Result Date: 01/14/2018 CLINICAL DATA:  Short of breath, lower extremity edema, chest pain EXAM: CHEST - 2 VIEW COMPARISON:  Chest x-ray of 10/26/2017 FINDINGS: The lungs are not well aerated. Also there has been and increase in volume of bilateral pleural effusions with increase in bibasilar atelectasis. There does appear to be mild pulmonary vascular congestion present. Cardiomegaly is stable. There is moderate thoracic aortic atherosclerosis noted. No acute bony abnormality is present. IMPRESSION: 1. Diminished aeration with increasing volume of bilateral pleural effusions and increasing bibasilar atelectasis. 2. Cardiomegaly and probable mild pulmonary vascular congestion. Electronically Signed   By: Ivar Drape M.D.   On: 01/14/2018 11:48    EKG: Independently reviewed. SR at 81bpm with some LAE noted.  Assessment/Plan Principal Problem:   Acute on chronic systolic CHF (congestive heart failure) (HCC) Active Problems:   CORONARY  ATHEROSCLEROSIS NATIVE CORONARY ARTERY   Dyslipidemia   Hypothyroidism   CKD (chronic kidney disease), stage III ([place])    1. Acute on chronic systolic and diastolic CHF.  Patient appears to have been minimally compliant with her home Lasix use given the recent death of her husband.  We will plan for IV Lasix 40 mg twice daily with monitoring of daily weights and strict I's and O's as  well as fluid restriction.  Recent echo on 10/2017 with no need to currently repeat. 2. Left lower extremity swelling.  This is due to recent injury and there appears to be no calf tenderness, however d-dimer was evaluated and is 0.99 which is likely within normal range, age-adjusted.  I will check ultrasound of the lower extremity to make sure that there is not a DVT. 3. CAD.  Continue home Coreg and Imdur as well as aspirin and Plavix. 4. Hypothyroidism.  Continue Synthroid. 5. Dyslipidemia.  Continue on heart healthy diet. 6. CKD stage III.  She is currently near her baseline, continue to monitor carefully with repeat renal panel in a.m. given ongoing diuresis.   DVT prophylaxis: Heparin Code Status: Full Family Communication: Daughter at bedside Disposition Plan: Plan for IV diuresis until symptomatic improvement; evaluation for DVT to left lower extremity Consults called: None Admission status: Observation, telemetry   Aslin Farinas Darleen Crocker DO [facility] Pager [phone]  If 7PM-7AM, please contact night-coverage [email or web address] Password [facility]  01/15/2018, 11:19 PM

## 2018-01-16 ENCOUNTER — Other Ambulatory Visit: Payer: Self-pay

## 2018-01-16 ENCOUNTER — Observation Stay (HOSPITAL_COMMUNITY): Payer: Medicare Other

## 2018-01-16 DIAGNOSIS — I2510 Atherosclerotic heart disease of native coronary artery without angina pectoris: Secondary | ICD-10-CM | POA: Diagnosis not present

## 2018-01-16 DIAGNOSIS — E785 Hyperlipidemia, unspecified: Secondary | ICD-10-CM

## 2018-01-16 DIAGNOSIS — E039 Hypothyroidism, unspecified: Secondary | ICD-10-CM

## 2018-01-16 DIAGNOSIS — Z91128 Patient's intentional underdosing of medication regimen for other reason: Secondary | ICD-10-CM | POA: Diagnosis not present

## 2018-01-16 DIAGNOSIS — J90 Pleural effusion, not elsewhere classified: Secondary | ICD-10-CM | POA: Diagnosis not present

## 2018-01-16 DIAGNOSIS — N183 Chronic kidney disease, stage 3 (moderate): Secondary | ICD-10-CM

## 2018-01-16 DIAGNOSIS — Z8249 Family history of ischemic heart disease and other diseases of the circulatory system: Secondary | ICD-10-CM | POA: Diagnosis not present

## 2018-01-16 DIAGNOSIS — T501X6A Underdosing of loop [high-ceiling] diuretics, initial encounter: Secondary | ICD-10-CM | POA: Diagnosis present

## 2018-01-16 DIAGNOSIS — I5021 Acute systolic (congestive) heart failure: Secondary | ICD-10-CM | POA: Diagnosis present

## 2018-01-16 DIAGNOSIS — Z9114 Patient's other noncompliance with medication regimen: Secondary | ICD-10-CM | POA: Diagnosis not present

## 2018-01-16 DIAGNOSIS — R600 Localized edema: Secondary | ICD-10-CM | POA: Diagnosis not present

## 2018-01-16 DIAGNOSIS — I5023 Acute on chronic systolic (congestive) heart failure: Secondary | ICD-10-CM | POA: Diagnosis not present

## 2018-01-16 DIAGNOSIS — I130 Hypertensive heart and chronic kidney disease with heart failure and stage 1 through stage 4 chronic kidney disease, or unspecified chronic kidney disease: Secondary | ICD-10-CM | POA: Diagnosis present

## 2018-01-16 DIAGNOSIS — F4320 Adjustment disorder, unspecified: Secondary | ICD-10-CM | POA: Diagnosis present

## 2018-01-16 DIAGNOSIS — I252 Old myocardial infarction: Secondary | ICD-10-CM | POA: Diagnosis not present

## 2018-01-16 DIAGNOSIS — I25118 Atherosclerotic heart disease of native coronary artery with other forms of angina pectoris: Secondary | ICD-10-CM | POA: Diagnosis not present

## 2018-01-16 DIAGNOSIS — Z7902 Long term (current) use of antithrombotics/antiplatelets: Secondary | ICD-10-CM | POA: Diagnosis not present

## 2018-01-16 DIAGNOSIS — F419 Anxiety disorder, unspecified: Secondary | ICD-10-CM | POA: Diagnosis present

## 2018-01-16 DIAGNOSIS — R7989 Other specified abnormal findings of blood chemistry: Secondary | ICD-10-CM | POA: Diagnosis not present

## 2018-01-16 DIAGNOSIS — Z79890 Hormone replacement therapy (postmenopausal): Secondary | ICD-10-CM | POA: Diagnosis not present

## 2018-01-16 DIAGNOSIS — Z79899 Other long term (current) drug therapy: Secondary | ICD-10-CM | POA: Diagnosis not present

## 2018-01-16 DIAGNOSIS — Z7982 Long term (current) use of aspirin: Secondary | ICD-10-CM | POA: Diagnosis not present

## 2018-01-16 DIAGNOSIS — Z9861 Coronary angioplasty status: Secondary | ICD-10-CM | POA: Diagnosis not present

## 2018-01-16 DIAGNOSIS — I5043 Acute on chronic combined systolic (congestive) and diastolic (congestive) heart failure: Secondary | ICD-10-CM | POA: Diagnosis present

## 2018-01-16 LAB — BASIC METABOLIC PANEL
ANION GAP: 14 (ref 5–15)
BUN: 26 mg/dL — AB (ref 6–20)
CO2: 28 mmol/L (ref 22–32)
Calcium: 8.5 mg/dL — ABNORMAL LOW (ref 8.9–10.3)
Chloride: 99 mmol/L — ABNORMAL LOW (ref 101–111)
Creatinine, Ser: 1.19 mg/dL — ABNORMAL HIGH (ref 0.44–1.00)
GFR calc Af Amer: 43 mL/min — ABNORMAL LOW (ref >60)
GFR calc non Af Amer: 37 mL/min — ABNORMAL LOW (ref >60)
GLUCOSE: 91 mg/dL (ref 65–99)
Potassium: 3.5 mmol/L (ref 3.5–5.1)
Sodium: 141 mmol/L (ref 135–145)

## 2018-01-16 LAB — CBC
HEMATOCRIT: 37.5 % (ref 36.0–46.0)
HEMOGLOBIN: 11.5 g/dL — AB (ref 12.0–15.0)
MCH: 25.4 pg — AB (ref 26.0–34.0)
MCHC: 30.7 g/dL (ref 30.0–36.0)
MCV: 82.8 fL (ref 78.0–100.0)
Platelets: 226 10*3/uL (ref 150–400)
RBC: 4.53 MIL/uL (ref 3.87–5.11)
RDW: 16.7 % — ABNORMAL HIGH (ref 11.5–15.5)
WBC: 7.5 10*3/uL (ref 4.0–10.5)

## 2018-01-16 MED ORDER — TECHNETIUM TC 99M DIETHYLENETRIAME-PENTAACETIC ACID
33.0000 | Freq: Once | INTRAVENOUS | Status: AC | PRN
  Administered 2018-01-16: 33 via RESPIRATORY_TRACT

## 2018-01-16 MED ORDER — TECHNETIUM TO 99M ALBUMIN AGGREGATED
4.0000 | Freq: Once | INTRAVENOUS | Status: AC | PRN
  Administered 2018-01-16: 4.3 via INTRAVENOUS

## 2018-01-16 NOTE — Progress Notes (Signed)
PROGRESS NOTE    [name]  WEX:[id number]  DOB: [birth year]  DOA: 01/15/2018 PCP: Lori Maize, MD   Brief Admission Hx: Lori [name] is a 84 y.o. female with medical history significant for systolic congestive heart failure with EF 30 to 35% on echo 10/2017 as well as mild LVH and grade 1 diastolic dysfunction, CAD with recent NSTEMI treated conservatively, hypertension, dyslipidemia, CKD stage III, and hypothyroidism who is brought to the emergency department at the recommendation of her PCP who evaluated her due to shortness of breath.  MDM/Assessment & Plan:  1. Acute on chronic systolic and diastolic CHF.  Patient appears to have been minimally compliant with her home Lasix use given the recent death of her husband.  Continue IV Lasix 40 mg twice daily with monitoring of daily weights and strict I's and O's as well as fluid restriction.  Recent echo on 10/2017 with no need to currently repeat. 2. Left lower extremity swelling.  This is due to recent injury and there appears to be no calf tenderness, however d-dimer was evaluated and is 0.99 which is likely within normal range, age-adjusted. ultrasound of the lower extremity negative for DVT and V/Q scan pending.  3. CAD.  Continue home Coreg and Imdur as well as aspirin and Plavix. 4. Hypothyroidism.  Continue Synthroid. 5. Dyslipidemia.  Continue on heart healthy diet. 6. CKD stage III.  She is currently near her baseline, continue to monitor carefully with repeat renal panel in a.m. given ongoing diuresis.   DVT prophylaxis: Heparin Code Status: Full Family Communication: Daughter at bedside Disposition Plan: Plan for IV diuresis until symptomatic improvement; evaluation for DVT to left lower extremity Consults called: None Admission status: Observation, telemetry   Subjective: Pt says she still has swelling in leg but is urinating frequently.  Still with SOB.    Objective: Vitals:   01/15/18 2300 01/15/18  2349 01/16/18 0105 01/16/18 0558  BP: (!) 119/57 123/60 (!) 124/53 120/75  Pulse: 80 80 84 86  Resp: (!) 26 (!) 22 16 16   Temp:   98 F (36.7 C) 97.7 F (36.5 C)  TempSrc:   Oral Oral  SpO2: 97% 94% 98% 100%  Weight:      Height:        Intake/Output Summary (Last 24 hours) at 01/16/2018 1234 Last data filed at 01/16/2018 0900 Gross per 24 hour  Intake 240 ml  Output 200 ml  Net 40 ml   Filed Weights   01/15/18 1943  Weight: 57.2 kg (126 lb)     REVIEW OF SYSTEMS  As per history otherwise all reviewed and reported negative  Exam:  General exam: awake, alert, appears younger than stated age.  NAD.  Respiratory system: bibasilar crackles. No increased work of breathing. Cardiovascular system: normal S1 & S2 heard.  Gastrointestinal system: Abdomen is nondistended, soft and nontender. Normal bowel sounds heard. Central nervous system: Alert and oriented. No focal neurological deficits. Extremities: 1+ pretibial edema BLEs. Neurological: no focal abnormalities.    Data Reviewed: Basic Metabolic Panel: Recent Labs  Lab 01/14/18 1200 01/15/18 2013 01/16/18 0641  NA 139 136 141  K 5.2 3.7 3.5  CL 99 96* 99*  CO2 26 29 28   GLUCOSE 95 131* 91  BUN 23 32* 26*  CREATININE 1.24* 1.25* 1.19*  CALCIUM 8.7 8.4* 8.5*   Liver Function Tests: Recent Labs  Lab 01/14/18 1200  AST 74*  ALT 62*  ALKPHOS 138*  BILITOT 0.4  PROT 5.7*  ALBUMIN 3.7   No results for input(s): LIPASE, AMYLASE in the last 168 hours. No results for input(s): AMMONIA in the last 168 hours. CBC: Recent Labs  Lab 01/15/18 2013 01/16/18 0641  WBC 8.1 7.5  HGB 11.4* 11.5*  HCT 37.7 37.5  MCV 82.9 82.8  PLT 229 226   Cardiac Enzymes: No results for input(s): CKTOTAL, CKMB, CKMBINDEX, TROPONINI in the last 168 hours. CBG (last 3)  No results for input(s): GLUCAP in the last 72 hours. No results found for this or any previous visit (from the past 240 hour(s)).   Studies: Dg Chest 2  View  Result Date: 01/15/2018 CLINICAL DATA:  Initial evaluation for acute shortness of breath. EXAM: CHEST - 2 VIEW COMPARISON:  Prior radiograph from 01/14/2018. FINDINGS: Stable cardiomegaly. Mediastinal silhouette normal. Aortic atherosclerosis. Lungs hypoinflated. Small moderate bilateral pleural effusions, right greater than left. Associated bibasilar opacities favored to reflect atelectatic changes. Increased vascular congestion with interstitial prominence as compared to previous, suggesting developing pulmonary interstitial edema. No other focal airspace disease. No pneumothorax. Osseous structures are unchanged. IMPRESSION: 1. Cardiomegaly with slightly worsened diffuse pulmonary vascular congestion and interstitial prominence, suggesting mild and/or developing pulmonary interstitial edema. 2. Small moderate bilateral pleural effusions with associated atelectasis, right greater than left. 3. Aortic atherosclerosis. Electronically Signed   By: Jeannine Boga M.D.   On: 01/15/2018 20:58   US Venous Img Lower Unilateral Left  Result Date: 01/16/2018 CLINICAL DATA:  Left leg edema. EXAM: LEFT LOWER EXTREMITY VENOUS DOPPLER ULTRASOUND TECHNIQUE: Gray-scale sonography with graded compression, as well as color Doppler and duplex ultrasound were performed to evaluate the lower extremity deep venous systems from the level of the common femoral vein and including the common femoral, femoral, profunda femoral, popliteal and calf veins including the posterior tibial, peroneal and gastrocnemius veins when visible. The superficial great saphenous vein was also interrogated. Spectral Doppler was utilized to evaluate flow at rest and with distal augmentation maneuvers in the common femoral, femoral and popliteal veins. COMPARISON:  None. FINDINGS: Contralateral Common Femoral Vein: Respiratory phasicity is normal and symmetric with the symptomatic side. No evidence of thrombus. Normal compressibility. Common  Femoral Vein: No evidence of thrombus. Normal compressibility, respiratory phasicity and response to augmentation. Saphenofemoral Junction: No evidence of thrombus. Normal compressibility and flow on color Doppler imaging. Profunda Femoral Vein: No evidence of thrombus. Normal compressibility and flow on color Doppler imaging. Femoral Vein: No evidence of thrombus. Normal compressibility, respiratory phasicity and response to augmentation. Popliteal Vein: No evidence of thrombus. Normal compressibility, respiratory phasicity and response to augmentation. Calf Veins: No evidence of thrombus. Normal compressibility and flow on color Doppler imaging. Superficial Great Saphenous Vein: No evidence of thrombus. Normal compressibility. Venous Reflux:  None. Other Findings:  None. IMPRESSION: No evidence of deep venous thrombosis.  Edema in the left calf. Electronically Signed   By: Dorise Bullion III M.D   On: 01/16/2018 10:51   Scheduled Meds: . aspirin EC  81 mg Oral Daily  . carvedilol  3.125 mg Oral BID WC  . clopidogrel  75 mg Oral Daily  . furosemide  40 mg Intravenous Q12H  . heparin  5,000 Units Subcutaneous Q8H  . isosorbide mononitrate  30 mg Oral Daily  . levothyroxine  50 mcg Oral QAC breakfast  . sodium chloride flush  3 mL Intravenous Q12H   Continuous Infusions: . sodium chloride      Principal Problem:   Acute on chronic systolic CHF (congestive heart failure) (HCC) Active  Problems:   CORONARY ATHEROSCLEROSIS NATIVE CORONARY ARTERY   Dyslipidemia   Hypothyroidism   CKD (chronic kidney disease), stage III (HCC)   CHF (congestive heart failure) ([place])   Time spent:   Irwin Brakeman, MD, FAAFP [facility] Pager [phone]  If 7PM-7AM, please contact night-coverage [email or web address] Password [facility] 01/16/2018, 12:34 PM    LOS: 0 days

## 2018-01-16 NOTE — Telephone Encounter (Signed)
We would have to document low O2 sats before it would be covered by her insurance.  I don't believe that there is documentation of this but she could come into an office visit with an APP to discuss.

## 2018-01-17 ENCOUNTER — Ambulatory Visit: Payer: Medicare Other | Admitting: Pediatrics

## 2018-01-17 ENCOUNTER — Inpatient Hospital Stay (HOSPITAL_COMMUNITY): Payer: Medicare Other

## 2018-01-17 DIAGNOSIS — I2510 Atherosclerotic heart disease of native coronary artery without angina pectoris: Secondary | ICD-10-CM

## 2018-01-17 LAB — BASIC METABOLIC PANEL
Anion gap: 13 (ref 5–15)
BUN: 28 mg/dL — ABNORMAL HIGH (ref 6–20)
CO2: 30 mmol/L (ref 22–32)
Calcium: 8.5 mg/dL — ABNORMAL LOW (ref 8.9–10.3)
Chloride: 91 mmol/L — ABNORMAL LOW (ref 101–111)
Creatinine, Ser: 1.24 mg/dL — ABNORMAL HIGH (ref 0.44–1.00)
GFR calc Af Amer: 41 mL/min — ABNORMAL LOW (ref >60)
GFR, EST NON AFRICAN AMERICAN: 35 mL/min — AB (ref >60)
GLUCOSE: 93 mg/dL (ref 65–99)
POTASSIUM: 3.6 mmol/L (ref 3.5–5.1)
Sodium: 134 mmol/L — ABNORMAL LOW (ref 135–145)

## 2018-01-17 LAB — MAGNESIUM: Magnesium: 1.9 mg/dL (ref 1.7–2.4)

## 2018-01-17 MED ORDER — FUROSEMIDE 10 MG/ML IJ SOLN
60.0000 mg | Freq: Two times a day (BID) | INTRAMUSCULAR | Status: DC
  Filled 2018-01-17: qty 6

## 2018-01-17 MED ORDER — ISOSORBIDE MONONITRATE ER 60 MG PO TB24: 30.0000 mg | ORAL_TABLET | Freq: Every day | ORAL | Status: AC

## 2018-01-17 MED ORDER — LORAZEPAM 0.5 MG PO TABS
0.5000 mg | ORAL_TABLET | Freq: Once | ORAL | Status: DC
  Filled 2018-01-17: qty 1

## 2018-01-17 MED ORDER — LORAZEPAM 0.5 MG PO TABS
0.5000 mg | ORAL_TABLET | Freq: Two times a day (BID) | ORAL | Status: DC | PRN
  Administered 2018-01-18: 1 mg via ORAL
  Filled 2018-01-17: qty 2

## 2018-01-17 MED ORDER — FUROSEMIDE 10 MG/ML IJ SOLN
30.0000 mg | Freq: Two times a day (BID) | INTRAMUSCULAR | Status: DC
  Administered 2018-01-17 – 2018-01-18 (×2): 30 mg via INTRAVENOUS
  Filled 2018-01-17 (×2): qty 4

## 2018-01-17 MED ORDER — POLYETHYLENE GLYCOL 3350 17 G PO PACK
17.0000 g | PACK | Freq: Two times a day (BID) | ORAL | Status: DC
  Administered 2018-01-17 – 2018-01-18 (×2): 17 g via ORAL
  Filled 2018-01-17 (×2): qty 1

## 2018-01-17 NOTE — Care Management Important Message (Signed)
Important Message  Patient Details  Name: VERYL ABRIL MRN: [id number] Date of Birth: 09/21/1919   Medicare Important Message Given:  Yes    Shelda Altes 01/17/2018, 12:01 PM

## 2018-01-17 NOTE — Evaluation (Signed)
Physical Therapy Evaluation Patient Details Name: Lori [name] MRN: [id number] DOB: 23-Apr-1919 Today's Date: 01/17/2018   History of Present Illness  Lori [name] is a 84 y.o. female with medical history significant for systolic congestive heart failure with EF 30 to 35% on echo 10/2017 as well as mild LVH and grade 1 diastolic dysfunction, CAD with recent NSTEMI treated conservatively, hypertension, dyslipidemia, CKD stage III, and hypothyroidism who is brought to the emergency department at the recommendation of her PCP who evaluated her due to shortness of breath.  Chest x-ray at that time demonstrated some development of pleural effusions and her Lasix dose was increased to oral 40 mg twice a day for a couple days.  She was also noted to be anxious and was given some anxiolytic medications.  She continued to have ongoing symptoms and an elevated BNP was noted on lab work which then prompted the ED visit.  Notably, patient has been somewhat noncompliant with her Lasix after her husband died about a month ago after a prolonged stay at a hospice facility.  She also injured her left leg around that time and has had persistent swelling in that leg.  She has also gained approximately 6 pounds over the last several weeks and has little to no appetite  Clinical Impression  Pt admitted with above diagnosis; she was received in bed and was agreeable to PT evaluation. Pt from home alone, with 24/7 assistance available from her dtr, where she was [facility] ambulator independently with rollator, required assistance for IADLs but is able to dress and bathe herself. Currently, pt supervision for all transfers and functional mobility with RW. She demo'd slowed, labored cadence, with mild unsteadiness during gait with RW and increased time to complete tasks. PT recommending HHPT and 24/7 assistance initially at home once ready for d/c in order to maximize strength, balance, and gait. Pt stating she did not want HHPT  because she knows what she's capable of and doesn't think she needs it. PT explained benefits of it and rationale behind recommendation and pt understood, but was still did not appear to want HHPT services. She did state that she felt like she needed some home O2 to help with her breathing when she got SOB. Will continue to follow acutely to address impairments listed below.      Follow Up Recommendations Home health PT;Supervision/Assistance - 24 hour    Equipment Recommendations  None recommended by PT    Recommendations for Other Services       Precautions / Restrictions Precautions Precautions: Fall Precaution Comments: oxygen      Mobility  Bed Mobility Overal bed mobility: Needs Assistance Bed Mobility: Supine to Sit     Supine to sit: Supervision        Transfers Overall transfer level: Needs assistance Equipment used: Rolling walker (2 wheeled) Transfers: Sit to/from [facility] Sit to Stand: Supervision Stand pivot transfers: Supervision          Ambulation/Gait Ambulation/Gait assistance: Supervision Ambulation Distance (Feet): 60 Feet Assistive device: Rolling walker (2 wheeled) Gait Pattern/deviations: Decreased stride length;Staggering right   Gait velocity interpretation: <1.31 ft/sec, indicative of household ambulator General Gait Details: slowed, labored cadence, mild stagger to the R when turning around;   Stairs            Wheelchair Mobility    Modified Rankin (Stroke Patients Only)       Balance Overall balance assessment: Needs assistance Sitting-balance support: Feet supported Sitting balance-Leahy Scale: Good  Standing balance support: Bilateral upper extremity supported Standing balance-Leahy Scale: Fair                               Pertinent Vitals/Pain Pain Assessment: No/denies pain    Home Living Family/patient expects to be discharged to:: Private residence Living Arrangements:  Alone Available Help at Discharge: Family;Available 24 hours/day Type of Home: House Home Access: Stairs to enter Entrance Stairs-Rails: Right Entrance Stairs-Number of Steps: 1 Home Layout: One level Home Equipment: Grab bars - tub/shower;Walker - 2 wheels;Walker - 4 wheels;Cane - single point Additional Comments: pt uses rollator for ambulation    Prior Function Level of Independence: Independent with assistive device(s)         Comments: Pt reports using rollator independently since her husband passed away 1 month ago; prior to the rollator she was using a RW and cane when she could; she reports ambulating HH distances with rollator; reports independence with dressing and bathing and states she intermittently makes meals for herself; states that her dtr, [name], helps her with making meals, dishes, etc.     Hand Dominance   Dominant Hand: Right    Extremity/Trunk Assessment   Upper Extremity Assessment Upper Extremity Assessment: Overall WFL for tasks assessed    Lower Extremity Assessment Lower Extremity Assessment: Generalized weakness    Cervical / Trunk Assessment Cervical / Trunk Assessment: Kyphotic(slightly kyphotic)  Communication   Communication: HOH  Cognition Arousal/Alertness: Awake/alert Behavior During Therapy: WFL for tasks assessed/performed Overall Cognitive Status: Within Functional Limits for tasks assessed                                        General Comments      Exercises     Assessment/Plan    PT Assessment Patient needs continued PT services  PT Problem List Decreased strength;Decreased activity tolerance;Cardiopulmonary status limiting activity;Decreased balance       PT Treatment Interventions DME instruction;Gait training;Therapeutic activities;Therapeutic exercise;Balance training;Patient/family education    PT Goals (Current goals can be found in the Care Plan section)  Acute Rehab PT Goals Patient Stated  Goal: to go home PT Goal Formulation: With patient Time For Goal Achievement: 01/22/18 Potential to Achieve Goals: Good    Frequency Min 3X/week   Barriers to discharge        Co-evaluation               AM-PAC PT "6 Clicks" Daily Activity  Outcome Measure                  End of Session Equipment Utilized During Treatment: Gait belt;Oxygen Activity Tolerance: Patient tolerated treatment well;No increased pain Patient left: in chair;with call bell/phone within reach;Other (comment)(with respiratory therapist) Nurse Communication: Mobility status PT Visit Diagnosis: Muscle weakness (generalized) (M62.81);Unsteadiness on feet (R26.81);Difficulty in walking, not elsewhere classified (R26.2)    Time: 5329-9242 PT Time Calculation (min) (ACUTE ONLY): 30 min   Charges:   PT Evaluation $PT Eval Low Complexity: 1 Low PT Treatments $Therapeutic Activity: 8-22 mins   PT G Codes:            Geraldine Solar PT, DPT

## 2018-01-17 NOTE — Progress Notes (Signed)
SATURATION QUALIFICATIONS: (This note is used to comply with regulatory documentation for home oxygen)  Patient Saturations on Room Air at Rest = 95%  Patient Saturations on Room Air while Ambulating =85%  Patient Saturations on 4 Liters of oxygen while Ambulating = 93%  Please briefly explain why patient needs home oxygen: DESATS ON ROOM AIR

## 2018-01-17 NOTE — Care Management Note (Signed)
Case Management Note  Patient Details  Name: Lori [name] MRN: [id number] Date of Birth: Jan 07, 1919  Subjective/Objective:                 Admitted with CHF exacerbation. This pt's second admit in 6 months. Pt is on oxygen acutely. PT recommends HH. Pt has used [facility] in the past. Pt very HOH, daughter at bedside for DC planning.    Action/Plan: DC home today with HH and [facility] referral. Daughter requests [facility], aware HH has 48 hrs to make first visit. Pt will have home O2 assessment. Daughter would like pt to have home oxygen even if she does not qualify per medicare guidelines, she is wiling to pay OOP, she wants all DME from [facility]. CM has asked RN Izora Gala to perform home O2 assessment. Juliann Pulse, [facility] rep, aware of [name] and DME needs, will pull pt info from chart and deliver DME to pt room prior to DC.   Expected Discharge Date:       01/17/18           Expected Discharge Plan:  [name]  In-House Referral:  NA  Discharge planning Services  CM Consult  Post Acute Care Choice:  Home Health, Durable Medical Equipment Choice offered to:  Patient  DME Arranged:  Oxygen DME Agency:  [place]:  PT, RN [facility] Agency:  [place]  Status of Service:  Completed, signed off   Sherald Barge, RN 01/17/2018, 11:35 AM

## 2018-01-17 NOTE — Care Management Important Message (Signed)
Important Message  Patient Details  Name: Lori [name] MRN: [id number] Date of Birth: 1919-05-22   Medicare Important Message Given:  Yes    Shelda Altes 01/17/2018, 12:00 PM

## 2018-01-17 NOTE — Discharge Instructions (Signed)
Follow with Primary MD  Eustaquio Maize, MD  and other consultants as instructed your Hospitalist MD  Please get a complete blood count and chemistry panel checked by your Primary MD at your next visit, and again as instructed by your Primary MD.  Get Medicines reviewed and adjusted: Please take all your medications with you for your next visit with your Primary MD  Laboratory/radiological data: Please request your Primary MD to go over all hospital tests and procedure/radiological results at the follow up, please ask your Primary MD to get all Hospital records sent to his/her office.  In some cases, they will be blood work, cultures and biopsy results pending at the time of your discharge. Please request that your primary care M.D. follows up on these results.  Also Note the following: If you experience worsening of your admission symptoms, develop shortness of breath, life threatening emergency, suicidal or homicidal thoughts you must seek medical attention immediately by calling 911 or calling your MD immediately  if symptoms less severe.  You must read complete instructions/literature along with all the possible adverse reactions/side effects for all the Medicines you take and that have been prescribed to you. Take any new Medicines after you have completely understood and accpet all the possible adverse reactions/side effects.   Do not drive when taking Pain medications or sleeping medications (Benzodaizepines)  Do not take more than prescribed Pain, Sleep and Anxiety Medications. It is not advisable to combine anxiety,sleep and pain medications without talking with your primary care practitioner  Special Instructions: If you have smoked or chewed Tobacco  in the last 2 yrs please stop smoking, stop any regular Alcohol  and or any Recreational drug use.  Wear Seat belts while driving.  Please note: You were cared for by a hospitalist during your hospital stay. Once you are discharged,  your primary care physician will handle any further medical issues. Please note that NO REFILLS for any discharge medications will be authorized once you are discharged, as it is imperative that you return to your primary care physician (or establish a relationship with a primary care physician if you do not have one) for your post hospital discharge needs so that they can reassess your need for medications and monitor your lab values.      Edema Edema is when you have too much fluid in your body or under your skin. Edema may make your legs, feet, and ankles swell up. Swelling is also common in looser tissues, like around your eyes. This is a common condition. It gets more common as you get older. There are many possible causes of edema. Eating too much salt (sodium) and being on your feet or sitting for a long time can cause edema in your legs, feet, and ankles. Hot weather may make edema worse. Edema is usually painless. Your skin may look swollen or shiny. Follow these instructions at home:  Keep the swollen body part raised (elevated) above the level of your heart when you are sitting or lying down.  Do not sit still or stand for a long time.  Do not wear tight clothes. Do not wear garters on your upper legs.  Exercise your legs. This can help the swelling go down.  Wear elastic bandages or support stockings as told by your doctor.  Eat a low-salt (low-sodium) diet to reduce fluid as told by your doctor.  Depending on the cause of your swelling, you may need to limit how much fluid you drink (  fluid restriction).  Take over-the-counter and prescription medicines only as told by your doctor. Contact a doctor if:  Treatment is not working.  You have heart, liver, or kidney disease and have symptoms of edema.  You have sudden and unexplained weight gain. Get help right away if:  You have shortness of breath or chest pain.  You cannot breathe when you lie down.  You have pain,  redness, or warmth in the swollen areas.  You have heart, liver, or kidney disease and get edema all of a sudden.  You have a fever and your symptoms get worse all of a sudden. Summary  Edema is when you have too much fluid in your body or under your skin.  Edema may make your legs, feet, and ankles swell up. Swelling is also common in looser tissues, like around your eyes.  Raise (elevate) the swollen body part above the level of your heart when you are sitting or lying down.  Follow your doctor's instructions about diet and how much fluid you can drink (fluid restriction). This information is not intended to replace advice given to you by your health care provider. Make sure you discuss any questions you have with your health care provider. Document Released: 02/24/2008 Document Revised: 09/25/2016 Document Reviewed: 09/25/2016 Elsevier Interactive Patient Education  2017 [employer].   Heart Failure Exacerbation  Heart failure is a condition in which the heart does not fill up with enough blood, and therefore does not pump enough blood and oxygen to the body. When this happens, parts of the body do not get the blood and oxygen they need to function properly. This can cause symptoms such as breathing problems, fatigue, swelling, and confusion. Heart failure exacerbation refers to heart failure symptoms that get worse. The symptoms may get worse suddenly or develop slowly over time. Heart failure exacerbation is a serious medical problem that should be treated right away. What are the causes? A heart failure exacerbation can be triggered by:  Not taking your heart failure medicines correctly.  Infections.  Eating an unhealthy diet or a diet that is high in salt (sodium).  Drinking too much fluid.  Drinking alcohol.  Taking illegal drugs, such as cocaine or methamphetamine.  Not exercising.  Other causes include:  Other heart conditions such as an irregular heartbeat  (arrhythmia).  Anemia.  Other medical problems, such as kidney failure.  Sometimes the cause of the exacerbation is not known. What are the signs or symptoms? When heart failure symptoms suddenly or slowly get worse, this may be a sign of heart failure exacerbation. Symptoms of heart failure include:  Breathing problems or shortness of breath.  Chronic coughing or wheezing.  Fatigue.  Nausea or lack of appetite.  Feeling light-headed.  Confusion or memory loss.  Increased heart rate or irregular heartbeat.  Buildup of fluid in the legs, ankles, feet, or abdomen.  Difficulty breathing when lying down.  How is this diagnosed? This condition is diagnosed based on:  Your symptoms and medical history.  A physical exam.  You may also have tests, including:  Electrocardiogram (ECG). This test measures the electrical activity of your heart.  Echocardiogram. This test uses sound waves to take a picture of your heart to see how well it works.  Blood tests.  Imaging tests, such as: ? Chest X-ray. ? MRI. ? Ultrasound.  Stress test. This test examines how well your heart functions when you exercise. Your heart is monitored while you exercise on a treadmill  or exercise bike. If you cannot exercise, medicines may be used to increase your heartbeat in place of exercise.  Cardiac catheterization. During this test, a thin, flexible tube (catheter) is inserted into a blood vessel and threaded up to your heart. This test allows your health care provider to check the arteries that lead to your heart (coronary arteries).  Right heart catheterization. During this test, the pressure in your heart is measured.  How is this treated? This condition may be treated by:  Adjusting your heart medicines.  Maintaining a healthy lifestyle. This includes: ? Eating a heart-healthy diet that is low in sodium. ? Not using any products that contain nicotine or tobacco, such as cigarettes and  e-cigarettes. ? Regular exercise. ? Monitoring your fluid intake. ? Monitoring your weight and reporting changes to your health care provider.  Treating sleep apnea, if you have this condition.  Surgery. This may include: ? Implanting a device that helps both sides of your heart contract at the same time (cardiac resynchronization therapy device). This can help with heart function and relieve heart failure symptoms. ? Implanting a device that can correct heart rhythm problems (implantable cardioverter defibrillator). ? Connecting a device to your heart to help it pump blood (ventricular assist device). ? Heart transplant.  Follow these instructions at home: Medicines  Take over-the-counter and prescription medicines only as told by your health care provider.  Do not stop taking your medicines or change the amount you take. If you are having problems or side effects from your medicines, talk to your health care provider.  If you are having difficulty paying for your medicines, contact a social worker or your clinic. There are many programs to assist with medicine costs.  Talk to your health care provider before starting any new medicines or supplements.  Make sure your health care provider and pharmacist have a list of all the medicines you are taking. Eating and drinking  Avoid drinking alcohol.  Eat a heart-healthy diet as told by your health care provider. This includes: ? Plenty of fruits and vegetables. ? Lean proteins. ? Low-fat dairy. ? Whole grains. ? Foods that are low in sodium. Activity  Exercise regularly as told by your health care provider. Balance exercise with rest.  Ask your health care provider what activities are safe for you. This includes sexual activity, exercise, and daily tasks at home or work. Lifestyle  Do not use any products that contain nicotine or tobacco, such as cigarettes and e-cigarettes. If you need help quitting, ask your health care  provider.  Maintain a healthy weight. Ask your health care provider what weight is healthy for you.  Consider joining a patient support group. This can help with emotional problems you may have, such as stress and anxiety. General instructions  Talk to your health care provider about flu and pneumonia vaccines.  Keep a list of medicines that you are taking. This may help in emergency situations.  Keep all follow-up visits as told by your health care provider. This is important. Contact a health care provider if:  You have questions about your medicines or you miss a dose.  You feel anxious, depressed, or stressed.  You have swelling in your feet, ankles, legs, or abdomen.  You have shortness of breath during activity or exercise.  You have a cough.  You have a fever.  You have trouble sleeping.  You gain 2-3 lb (1-1.4 kg) in 24 hours or 5 lb (2.3 kg) in a week.  Get help right away if:  You have chest pain.  You have shortness of breath while resting.  You have severe fatigue.  You are confused.  You have severe dizziness.  You have a rapid or irregular heartbeat.  You have nausea or you vomit.  You have a cough that is worse at night or you cannot lie flat.  You have a cough that will not go away.  You have severe depression or sadness. Summary  When heart failure symptoms get worse, it is called heart failure exacerbation.  Common causes of this condition include taking medicines incorrectly, infections, and drinking alcohol.  This condition may be treated by adjusting medicines, maintaining a healthy lifestyle, or surgery.  Do not stop taking your medicines or change the amount you take. If you are having problems or side effects from your medicines, talk to your health care provider. This information is not intended to replace advice given to you by your health care provider. Make sure you discuss any questions you have with your health care  provider. Document Released: 01/19/2017 Document Revised: 01/19/2017 Document Reviewed: 01/19/2017 Elsevier Interactive Patient Education  2018 [employer].   Heart Failure Action Plan A heart failure action plan helps you understand what to do when you have symptoms of heart failure. Follow the plan that was created by you and your health care provider. Review your plan each time you visit your health care provider. Red zone These signs and symptoms mean you should get medical help right away:  You have trouble breathing when resting.  You have a dry cough that is getting worse.  You have swelling or pain in your legs or abdomen that is getting worse.  You suddenly gain more than 2-3 lb (0.9-1.4 kg) in a day, or more than 5 lb (2.3 kg) in one week. This amount may be more or less depending on your condition.  You have trouble staying awake or you feel confused.  You have chest pain.  You do not have an appetite.  You pass out.  If you experience any of these symptoms:  Call your local emergency services (911 in the [place] right away or seek help at the emergency department of the nearest hospital.  Yellow zone These signs and symptoms mean your condition may be getting worse and you should make some changes:  You have trouble breathing when you are active or you need to sleep with extra pillows.  You have swelling in your legs or abdomen.  You gain 2-3 lb (0.9-1.4 kg) in one day, or 5 lb (2.3 kg) in one week. This amount may be more or less depending on your condition.  You get tired easily.  You have trouble sleeping.  You have a dry cough.  If you experience any of these symptoms:  Contact your health care provider within the next day.  Your health care provider may adjust your medicines.  Green zone These signs mean you are doing well and can continue what you are doing:  You do not have shortness of breath.  You have very little swelling or no new  swelling.  Your weight is stable (no gain or loss).  You have a normal activity level.  You do not have chest pain or any other new symptoms.  Follow these instructions at home:  Take over-the-counter and prescription medicines only as told by your health care provider.  Weigh yourself daily. Your target weight is __________ lb (__________ kg). ? Call  your health care provider if you gain more than __________ lb (__________ kg) in a day, or more than __________ lb (__________ kg) in one week.  Eat a heart-healthy diet. Work with a diet and nutrition specialist (dietitian) to create an eating plan that is best for you.  Keep all follow-up visits as told by your health care provider. This is important. Where to find more information:  American Heart Association: www.heart.org Summary  Follow the action plan that was created by you and your health care provider.  Get help right away if you have any symptoms in the Red zone. This information is not intended to replace advice given to you by your health care provider. Make sure you discuss any questions you have with your health care provider. Document Released: 10/17/2016 Document Revised: 10/17/2016 Document Reviewed: 10/17/2016 Elsevier Interactive Patient Education  2018 [employer].   Heart Failure Eating Plan Heart failure, also called congestive heart failure, occurs when your heart does not pump blood well enough to meet your body's needs for oxygen-rich blood. Heart failure is a long-term (chronic) condition. Living with heart failure can be challenging. However, following your health care provider's instructions about a healthy lifestyle and working with a diet and nutrition specialist (dietitian) to choose the right foods may help to improve your symptoms. What are tips for following this plan? General guidelines  Do not eat more than 2,300 mg of salt (sodium) a day. The amount of sodium that is recommended for you may be  lower, depending on your condition.  Maintain a healthy body weight as directed. Ask your health care provider what a healthy weight is for you. ? Check your weight every day. ? Work with your health care provider and dietitian to make a plan that is right for you to lose weight or maintain your current weight.  Limit how much fluid you drink. Ask your health care provider or dietitian how much fluid you can have each day.  Limit or avoid alcohol as told by your health care provider or dietitian. Reading food labels  Check food labels for the amount of sodium per serving. Choose foods that have less than 140 mg (milligrams) of sodium in each serving.  Check food labels for the number of calories per serving. This is important if you need to limit your daily calorie intake to lose weight.  Check food labels for the serving size. If you eat more than one serving, you will be eating more sodium and calories than what is listed on the label.  Look for foods that are labeled as "sodium-free," "very low sodium," or "low sodium." ? Foods labeled as "reduced sodium" or "lightly salted" may still have more sodium than what is recommended for you. Cooking  Avoid adding salt when cooking. Ask your health care provider or dietitian before using salt substitutes.  Season food with salt-free seasonings, spices, or herbs. Check the label of seasoning mixes to make sure they do not contain salt.  Cook with heart-healthy oils, such as olive, canola, soybean, or sunflower oil.  Do not fry foods. Cook foods using low-fat methods, such as baking, boiling, grilling, and broiling.  Limit unhealthy fats when cooking by: ? Removing the skin from poultry, such as chicken. ? Removing all visible fats from meats. ? Skimming the fat off from stews, soups, and gravies before serving them. Meal planning  Limit your intake of: ? Processed, canned, or pre-packaged foods. ? Foods that are high in trans fat,  such  as fried foods. ? Sweets, desserts, sugary drinks, and other foods with added sugar. ? Full-fat dairy products, such as whole milk.  Eat a balanced diet that includes: ? 4-5 servings of fruit each day and 4-5 servings of vegetables each day. At each meal, try to fill half of your plate with fruits and vegetables. ? Up to 6-8 servings of whole grains each day. ? Up to 2 servings of lean meat, poultry, or fish each day. One serving of meat is equal to 3 oz. This is about the same size as a deck of cards. ? 2 servings of low-fat dairy each day. ? Heart-healthy fats. Healthy fats called omega-3 fatty acids are found in foods such as flaxseed and cold-water fish like sardines, salmon, and mackerel.  Aim to eat 25-35 g (grams) of fiber a day. Foods that are high in fiber include apples, broccoli, carrots, beans, peas, and whole grains.  Do not add salt or condiments that contain salt (such as soy sauce) to foods before eating.  When eating at a restaurant, ask that your food be prepared with less salt or no salt, if possible.  Try to eat 2 or more vegetarian meals each week.  Eat more home-cooked food and eat less restaurant, buffet, and fast food. Recommended foods The items listed may not be a complete list. Talk with your dietitian about what dietary choices are best for you. Grains Bread with less than 80 mg of sodium per slice. Whole-wheat pasta, quinoa, and brown rice. Oats and oatmeal. Barley. [place]. Grits and cream of wheat. Whole-grain and whole-wheat cold cereal. Vegetables All fresh vegetables. Vegetables that are frozen without sauce or added salt. Low-sodium or sodium-free canned vegetables. Fruits All fresh, frozen, and canned fruits. Dried fruits, such as raisins, prunes, and cranberries. Meats and other protein foods Lean cuts of meat. Skinless chicken and [place]. Fish with high omega-3 fatty acids, such as salmon, sardines, and other cold-water fishes. Eggs. Dried beans,  peas, and edamame. Unsalted nuts and nut butters. Dairy Low-fat or nonfat (skim) milk and dried milk. Rice milk, soy milk, and almond milk. Low-fat or nonfat yogurt. Small amounts of reduced-sodium block cheese. Low-sodium cottage cheese. Fats and oils Olive, canola, soybean, flaxseed, or sunflower oil. Avocado. Sweets and desserts Apple sauce. Granola bars. Sugar-free pudding and gelatin. Frozen fruit bars. Seasoning and other foods Fresh and dried herbs. Lemon or lime juice. Vinegar. Low-sodium ketchup. Salt-free marinades, salad dressings, sauces, and seasonings. Foods to avoid The items listed may not be a complete list. Talk with your dietitian about what dietary choices are best for you. Grains Bread with more than 80 mg of sodium per slice. Hot or cold cereal with more than 140 mg sodium per serving. Salted pretzels and crackers. Pre-packaged breadcrumbs. Bagels, croissants, and biscuits. Vegetables Canned vegetables. Frozen vegetables with sauce or seasonings. Creamed vegetables. [place] fries. Onion rings. Pickled vegetables and sauerkraut. Fruits Fruits that are dried with sodium-containing preservatives. Meats and other protein foods Ribs and chicken wings. Bacon, ham, pepperoni, bologna, salami, and packaged luncheon meats. Hot dogs, bratwurst, and sausage. Canned meat. Smoked meat and fish. Salted nuts and seeds. Dairy Whole milk, half-and-half, and cream. Buttermilk. Processed cheese, cheese spreads, and cheese curds. Regular cottage cheese. Feta cheese. Shredded cheese. String cheese. Fats and oils Butter, lard, shortening, ghee, and bacon fat. Canned and packaged gravies. Seasoning and other foods Onion salt, garlic salt, table salt, and sea salt. Marinades. Regular salad dressings. Relishes, pickles, and olives.  Meat flavorings and tenderizers, and bouillon cubes. Horseradish, ketchup, and mustard. Worcestershire sauce. Teriyaki sauce, soy sauce (including reduced sodium). Hot  sauce and Tabasco sauce. Steak sauce, fish sauce, oyster sauce, and cocktail sauce. Taco seasonings. Barbecue sauce. [place] sauce. Summary  A heart failure eating plan includes changes that limit your intake of sodium and unhealthy fat, and it may help you lose weight or maintain a healthy weight. Your health care provider may also recommend limiting how much fluid you drink.  Most people with heart failure should eat no more than 2,300 mg of salt (sodium) a day. The amount of sodium that is recommended for you may be lower, depending on your condition.  Contact your health care provider or dietitian before making any major changes to your diet. This information is not intended to replace advice given to you by your health care provider. Make sure you discuss any questions you have with your health care provider. Document Released: 01/22/2017 Document Revised: 01/22/2017 Document Reviewed: 01/22/2017 Elsevier Interactive Patient Education  2018 [place] With Heart Failure  Heart failure is a long-term (chronic) condition in which the heart cannot pump enough blood through the body. When this happens, parts of the body do not get the blood and oxygen they need. There is no cure for heart failure at this time, so it is important for you to take good care of yourself and follow the treatment plan set by your health care provider. If you are living with heart failure, there are ways to help you manage the disease. Follow these instructions at home: Living with heart failure requires you to make changes in your life. Your health care team will teach you about the changes you need to make in order to relieve your symptoms and lower your risk of going to the hospital. Follow the treatment plan as set by your health care provider. Medicines Medicines are important in reducing your heart's workload, slowing the progression of heart failure, and improving your symptoms.  Take  over-the-counter and prescription medicines only as told by your health care provider.  Do not stop taking your medicine unless your health care provider tells you to do that.  Do not skip any dose of your medicine.  Refill prescriptions before you run out of medicine. You need your medicines every day.  Eating and drinking  Eat heart-healthy foods. Talk with a dietitian to make an eating plan that is right for you. ? If directed by your health care provider: ? Limit salt (sodium). Lowering your sodium intake may reduce symptoms of heart failure. Ask a dietitian to recommend heart-healthy seasonings. ? Limit your fluid intake. Fluid restriction may reduce symptoms of heart failure. ? Use low-fat cooking methods instead of frying. Low-fat methods include roasting, grilling, broiling, baking, poaching, steaming, and stir-frying. ? Choose foods that contain no trans fat and are low in saturated fat and cholesterol. Healthy choices include fresh or frozen fruits and vegetables, fish, lean meats, legumes, fat-free or low-fat dairy products, and whole-grain or high-fiber foods.  Limit alcohol intake to no more than 1 drink a day for nonpregnant women and 2 drinks a day for men. One drink equals 12 oz of beer, 5 oz of wine, or 1 oz of hard liquor. ? Drinking more than that is harmful to your heart. Tell your health care provider if you drink alcohol several times a week. ? Talk with your health care provider about whether any level of alcohol use  is safe for you. Activity  Ask your health care provider about attending cardiac rehabilitation. These programs include aerobic physical activity, which provides many benefits for your heart.  If no cardiac rehabilitation program is available, ask your health care provider what aerobic exercises are safe for you to do. Lifestyle Make the lifestyle changes recommended by your health care provider. In general:  Lose weight if your health care provider  tells you to do that. Weight loss may reduce symptoms of heart failure.  Do not use any products that contain nicotine or tobacco, such as cigarettes or e-cigarettes. If you need help quitting, ask your health care provider.  Do not use street (illegal) drugs.  Return to your normal activities as told by your health care provider. Ask your health care provider what activities are safe for you.  General instructions  Make sure you weigh yourself every day to track your weight. Rapid weight gain may indicate an increase in fluid in your body and may increase the workload of your heart. ? Weigh yourself every morning. Do this after you urinate but before you eat breakfast. ? Wear the same type of clothing, without shoes, each time you weigh yourself. ? Weigh yourself on the same scale and in the same spot each time.  Living with chronic heart failure often leads to emotions such as fear, stress, anxiety, and depression. If you feel any of these emotions and need help coping, contact your health care provider. Other ways to get help include: ? Talking to friends and family members about your condition. They can give you support and guidance. Explain your symptoms to them and, if comfortable, invite them to attend appointments or rehabilitation with you. ? Joining a support group for people with chronic heart failure. Talking with other people who have the same symptoms may give you new ways of coping with your disease and your emotions.  Stay up to date with your shots (vaccines). Staying current on pneumococcal and influenza vaccines is especially important in preventing germs from attacking your airways (respiratory infections).  Keep all follow-up visits as told by your health care provider. This is important. How to recognize changes in your condition You and your family members need to know what changes to watch for in your condition. Watch for the following changes and report them to your  health care provider:  Sudden weight gain. Ask your health care provider what amount of weight gain to report.  Shortness of breath: ? Feeling short of breath while at rest, with no exercise or activity that required great effort. ? Feeling breathless with activity.  Swelling of your lower legs or ankles.  Difficulty sleeping: ? You wake up feeling short of breath. ? You have to use more pillows to raise your head in order to sleep.  Frequent, dry, hacking cough.  Loss of appetite.  Feeling more tired all the time.  Depression or feelings of sadness or hopelessness.  Bloating in the stomach.  Where to find more information  Local support groups. Ask your health care provider about groups near you.  The American Heart Association: www.heart.org Contact a health care provider if:  You have a rapid weight gain.  You have increasing shortness of breath that is unusual for you.  You are unable to participate in your usual physical activities.  You tire easily.  You cough more than normal, especially with physical activity.  You have any swelling or more swelling in areas such as your hands,  feet, ankles, or abdomen.  You feel like your heart is beating quickly (palpitations).  You become dizzy or light-headed when you stand up. Get help right away if:  You have difficulty breathing.  You notice or your family notices a change in your awareness, such as having trouble staying awake or having difficulty with concentration.  You have pain or discomfort in your chest.  You have an episode of fainting (syncope). Summary  There is no cure for heart failure, so it is important for you to take good care of yourself and follow the treatment plan set by your health care provider.  Medicines are important in reducing your heart's workload, slowing the progression of heart failure, and improving your symptoms.  Living with chronic heart failure often leads to emotions such  as fear, stress, anxiety, and depression. If you are feeling any of these emotions and need help coping, contact your health care provider. This information is not intended to replace advice given to you by your health care provider. Make sure you discuss any questions you have with your health care provider. Document Released: 01/20/2017 Document Revised: 01/20/2017 Document Reviewed: 01/20/2017 Elsevier Interactive Patient Education  2018 [place].   Heart Failure Medicines Heart failure is a condition in which the heart cannot pump enough blood through the body. This can cause symptoms such as shortness of breath, fatigue, and confusion. There are two types of heart failure:  Heart failure with reduced ejection fraction. In this type, the heart muscle is weak.  Heart failure with preserved ejection fraction. In this type, the heart muscle does not fill with blood properly and may be stiff.  There is no cure for heart failure. However, being treated with medicines and following your health care provider's instructions about a healthy lifestyle can help you stay active, avoid problems, and live longer. Talk to your health care provider about all medicines that you are taking, how often you should take them, and what possible problems (side effects) they may cause. Talk with your health care provider if you have difficulty affording your medicines. What are some common medicines for heart failure? The medicines that are prescribed for you will depend on your symptoms, the type of heart failure you have, and the cause of your heart failure. In some cases, you may need to take more than one medicine. You will be prescribed the following medicines according to your type of heart failure: Heart failure with reduced ejection fraction  Beta-blockers.  Angiotensin-converting enzyme (ACE) inhibitors.  Angiotensin II receptor blockers (ARBs).  Angiotensin receptor neprilysin inhibitors  (ARNIs).  Aldosterone antagonists.  Diuretics.  Digoxin.  Nitrates. Heart failure with preserved ejection fraction  Medicines to control blood pressure, including: ? Beta-blockers. ? Angiotensin-converting enzyme (ACE) inhibitors. ? Angiotensin II receptor blockers (ARBs).  Diuretics.  Aldosterone antagonists. What should I know about beta-blockers?  These medicines lower your blood pressure and slow your heart rate. This helps to lessen your heart's workload.  They can help to relieve chest pain (angina).  They can help to improve your heart's ability to pump.  They may cause asthma attacks and shortness of breath.  Because these medicines slow your heart rate, it is important not to overwork yourself while exercising. Talk to your health care provider about what your target heart rate should be while you exercise.  These medicines can hide the symptoms of low blood sugar (glucose), which is also called hypoglycemia. If you have diabetes, make sure to check your  blood glucose carefully. If you have hypoglycemia, talk to your health care provider about adjusting your medicines.  Beta-blockers may make you feel dizzy or light-headed at first. Do not drive or use heavy machinery when you first start these medicines. Ask your health care provider when it is safe for you to drive. What should I know about ACE inhibitors or ARBs?  These medicines help to widen arteries and veins. This action lowers your blood pressure and lessens the strain on your heart, making it easier for your heart to pump.  They can help to lessen the symptoms of heart failure.  ARBs are often used if a person cannot take ACE inhibitors.  ACE inhibitors may cause a dry cough.  In rare cases, ACE inhibitors and ARBs can cause swelling of the tongue or lips, other swelling, taste problems, and skin rashes. If these symptoms occur, stop taking the medicines and contact your health care provider.  Do not  take ACE inhibitors if you are pregnant or may become pregnant. These medicines can cause health problems in an unborn baby.  These medicines may cause dizziness. You may need regular checkups and blood tests to monitor how they are working. What should I know about ARNIs?  These medicines are a combination of an ARB and another medicine. They lower your blood pressure.  Side effects may include dry cough, dizziness, low blood pressure, and kidney problems.  Do not take ARNIs if you are already taking ACE inhibitors or ARBs.  You may notice increased urination when taking these medicines.  ARNIs can raise the amount of potassium in the blood. Your potassium levels will be monitored regularly by your health care provider. What should I know about aldosterone antagonists?  They help the body to remove excess sodium through urination. This helps to lessen the amount of blood that the heart needs to pump.  They can also help to lower blood pressure and improve the heart's ability to pump blood.  They may cause dizziness, diarrhea, coughing, or flu-like symptoms.  They should not be used if you have type 2 diabetes.  They can raise the amount of potassium in the blood. Your potassium levels will be monitored regularly by your health care provider.  These medicines can make men's breasts large and tender. What should I know about diuretics?  Diuretics are medicines that help the body get rid of excess fluid through urination. They can also help lessen your heart's workload.  They help to lessen fluid buildup in the lungs, ankles, and feet.  They help to lower your blood pressure.  They can worsen problems with controlling urination (urinary incontinence).  They may cause dizziness, headaches, muscle cramps, and an upset stomach.  They can cause weak muscles, dry mouth, or confusion. It is important to drink plenty of fluids while taking these medicines, especially while exercising or  on hot days. What should I know about digoxin?  Digoxin helps the heart pump more blood efficiently. It also lowers your heart rate.  It can help ease heart failure symptoms and may be used if other medicines do not work.  It can also help with irregular heartbeat (arrhythmia).  It may cause stomach problems, fatigue, headache, drowsiness, or vision problems. What should I know about nitrates?  Nitrates relax the blood vessels and increase oxygen and blood supply to the heart. They also lower the blood pressure.  They are usually taken to lessen chest pain.  They may cause headaches, flushing, or irregular  heartbeat. Summary  A healthy lifestyle and treatment with medicine will relieve symptoms of heart failure.  In some cases, you may need to take more than one medicine.  It is important to talk to your health care provider about how often you should take your medicines. Do not skip a dose or change your dosage.  Talk to your heaLth care provider about possible side effects of these medicines. This information is not intended to replace advice given to you by your health care provider. Make sure you discuss any questions you have with your health care provider. Document Released: 01/22/2017 Document Revised: 01/22/2017 Document Reviewed: 01/22/2017 Elsevier Interactive Patient Education  2018 [place].   Preventing Heart Failure Heart failure is a condition in which the heart has trouble pumping blood. This may mean that the heart cannot pump enough blood out to the body, or that the heart does not fill up with enough blood. Either of those problems can lead to symptoms such as fatigue, trouble breathing, and swelling throughout the body. This is a common medical condition that affects not only the heart, but the entire body. Making certain nutrition and lifestyle changes can help you prevent heart failure and avoid serious health problems. What nutrition changes can be  made?  If you are overweight or obese, reduce how many calories you eat each day so that you lose weight. Work with your health care provider or a diet and nutrition specialist (dietitian) to determine how many calories you need each day.  Eat foods that are low in salt (sodium). Avoid adding extra salt to foods.  Eat a well-balanced diet that includes a lot of: ? Fresh fruits and vegetables. ? Whole grains. ? Lean meats. ? Beans. ? Fat-free or low-fat dairy products.  Avoid foods that contain a lot of: ? Trans fats. ? Saturated fats. ? Sugar. ? Cholesterol. What lifestyle changes can be made?  Do not use any products that contain nicotine or tobacco, such as cigarettes and e-cigarettes. If you need help quitting or reducing how much you smoke, ask your health care provider.  Stop using alcohol, or limit alcohol intake to no more than 1 drink a day for nonpregnant women and 2 drinks a day for men. One drink equals 12 oz of beer, 5 oz of wine, or 1 oz of hard liquor.  Exercise for at least 150 minutes each week, or as much as told by your health care provider. ? Do moderate-intensity exercise, such as brisk walking, bicycling, or water aerobics. ? Ask your health care provider which activities are safe for you.  See a health care provider regularly for screening and wellness checks. Know your heart health indicators, such as: ? Blood pressure. ? Cholesterol levels. ? Blood sugar (glucose) levels. ? Weight and BMI.  If you have diabetes, manage your condition and follow your treatment plan as instructed.  Try to get 7-9 hours of sleep each night. To help with sleep: ? Keep your bedroom cool and dark. ? Do not eat a heavy meal during the hour before you go to bed. ? Do not drink alcohol or caffeinated drinks before bed. ? Avoid screen time before bedtime. This means avoiding television, computers, tablets, and cell phones.  Find ways to relax and manage stress. These may  include: ? Breathing exercises. ? Meditation. ? Yoga. ? Listening to music. Why are these changes important?  A well-balanced diet with the appropriate amount of calories can keep your body weight at  a healthy level, which reduces strain on your heart.  A low-sodium diet can help keep your blood pressure in a normal range and keep your blood vessels working properly.  Quitting smoking and limiting alcohol intake can reduce harmful effects that these substances have on your heart and blood vessels.  Regular exercise can keep your heart strong so it can pump blood normally.  Managing diabetes helps your blood circulate and can help you maintain a healthy weight.  Managing stress helps to reduce the risk of high blood pressure and heart problems. What can happen if changes are not made? Heart failure can cause very serious problems that may get worse over time, such as:  Extreme fatigue during normal physical activities.  Shortness of breath or trouble breathing.  Swelling in your abdomen, legs, ankles, feet, or neck.  Fluid buildup throughout the body.  Weight gain.  Cough.  Frequent urination.  What can I do to lower my risk? You may be able to lower your risk of heart failure by:  Losing weight or keeping your weight under control.  Working with your health care provider to manage your: ? Cholesterol. ? Blood pressure. ? Diabetes, if this applies.  Eating a healthy diet.  Exercising regularly.  Avoiding unhealthy habits, such as smoking, drinking, or using drugs.  Getting plenty of sleep.  Managing your stress.  How is this treated? Heart failure cannot be cured except by heart transplant, but treatment can help to improve your quality of life. Treatment may include:  Medicines to help: ? Lower blood pressure. ? Remove excess sodium from your body. ? Relax blood vessels. ? Improve heart function. ? Control other symptoms of heart failure.  Surgery to  open blocked coronary arteries or repair damaged heart valves.  Implantation of a biventricular pacemaker to improve heart muscle function (cardiac resynchronization therapy). This device paces both the right ventricle and left ventricle.  Implantation of a device to treat serious abnormal heart rhythms (implantable cardioverter defibrillator, ICD).  Implantation of a mechanical heart pump to improve the pumping ability of your heart (left ventricular assist device, LVAD).  Heart transplant. This treatment is considered for certain people who do not improve with other treatments.  Where to find more information:  National Heart, Lung, and Blood Institute: ClickDebate.gl  Centers for Disease Control and Prevention: LawyerNetworking.com.cy  [facility]: https://www.[place]-brown.info/  American Heart Association: ReligiousCamps.at.jsp Contact a health care provider if:  You have rapid weight gain.  You have increasing shortness of breath that is unusual for you.  You tire easily, or you are unable to participate in your usual activities.  You cough more than normal, especially with physical activity.  You have any swelling or more swelling in areas such as your hands, feet, ankles, or abdomen. Summary  Heart failure can be prevented by making changes to your diet and your lifestyle.  It is important to eat a healthy diet, manage your weight, exercise regularly, manage stress, avoid drugs and alcohol, and keep your cholesterol and blood pressure under control.  Heart failure can cause very serious problems over time. This information is not intended to replace advice given to you by your health care provider. Make sure you discuss any questions you have with your health care provider. Document Released:  04/28/2016 Document Revised: 11/24/2016 Document Reviewed: 04/28/2016 Elsevier Interactive Patient Education  2018 [employer].   Peripheral Edema Peripheral edema is swelling that is caused by a buildup of fluid. Peripheral edema most often  affects the lower legs, ankles, and feet. It can also develop in the arms, hands, and face. The area of the body that has peripheral edema will look swollen. It may also feel heavy or warm. Your clothes may start to feel tight. Pressing on the area may make a temporary dent in your skin. You may not be able to move your arm or leg as much as usual. There are many causes of peripheral edema. It can be a complication of other diseases, such as congestive heart failure, kidney disease, or a problem with your blood circulation. It also can be a side effect of certain medicines. It often happens to women during pregnancy. Sometimes, the cause is not known. Treating the underlying condition is often the only treatment for peripheral edema. Follow these instructions at home: Pay attention to any changes in your symptoms. Take these actions to help with your discomfort:  Raise (elevate) your legs while you are sitting or lying down.  Move around often to prevent stiffness and to lessen swelling. Do not sit or stand for long periods of time.  Wear support stockings as told by your health care provider.  Follow instructions from your health care provider about limiting salt (sodium) in your diet. Sometimes eating less salt can reduce swelling.  Take over-the-counter and prescription medicines only as told by your health care provider. Your health care provider may prescribe medicine to help your body get rid of excess water (diuretic).  Keep all follow-up visits as told by your health care provider. This is important.  Contact a health care provider if:  You have a fever.  Your edema starts suddenly or is getting worse, especially if you are pregnant or have a  medical condition.  You have swelling in only one leg.  You have increased swelling and pain in your legs. Get help right away if:  You develop shortness of breath, especially when you are lying down.  You have pain in your chest or abdomen.  You feel weak.  You faint. This information is not intended to replace advice given to you by your health care provider. Make sure you discuss any questions you have with your health care provider. Document Released: 10/15/2004 Document Revised: 02/10/2016 Document Reviewed: 03/20/2015 Elsevier Interactive Patient Education  2018 [place] With Heart Failure Caregivers provide physical and emotional support to friends or family members with heart failure. If you are supporting someone who has heart failure, you play an important role in making sure that the person follows schedules, takes medicines, and follows the overall treatment and rehabilitation plan. What do I need to know about heart failure? Heart failure is a serious condition. It means that the heart cannot fill or pump enough blood and oxygen to support the body and its functions. This can cause damage to many parts of the body, including important organs.  The person with heart failure may:  Need to take medicines on a regular basis.  Need to have some procedures or surgery.  Require rehabilitation treatment.  Need to make changes in: ? Lifestyle. ? Diet. ? Physical activity and exercise.  Planning for discharge from the hospital Privacy laws require that your friend or family member give you permission to hear private health information before this information will be shared with you. Talk with the health care provider about these rules. Before your friend or family member leaves the hospital after treatment, make sure you understand:  The recovery process  after heart failure.  Physical, emotional, behavior, and other changes that occur in the  person with heart failure.  The medicines that the person with heart failure has to take and when to take them.  Whether you will need extra help at home.  The treatments for heart failure.  Changes you have to make at home to make the person safe.  How to lower the person's risk of having other heart problems.  Diet and exercise changes for the person with heart failure.  Whether the person with heart failure will need help using the bathroom, bathing, eating, or doing other activities.  Whether you need to get certain devices or equipment for the person with heart failure.  That heart failure requires emergency medical care if symptoms appear.  Caring for yourself It is normal to have many different emotions while caring for someone with heart failure. The lifestyle changes that come with this diagnosis can be stressful and difficult. As a caregiver, you need to:  Monitor yourself for signs of emotional, physical, and mental exhaustion (caregiver burnout). This can happen if you feel overwhelmed.  Take care of yourself so that you can better meet the needs of your loved one and also meet your own needs.  Signs of caregiver burnout  Sleep problems.  Difficulty concentrating.  Changes in appetite.  Depressed mood and mood swings.  Feeling emotionally out of control.  Feeling unmotivated to do anything.  Neglecting your loved one, including forgetting to give medicines or keep appointments. Ways to care for yourself  Find a local support group.  Ask friends and family for help.  Get counseling or therapy.  Ask a health care provider for help.  Take time for yourself.  Relax, exercise, and eat healthy. What support is available?  Ask your health care provider for help.  Think about taking classes to learn more about supporting someone with heart failure.  Take a class to become certified in CPR.  Think about joining a support group for people who are  caring for someone with heart failure. It is possible to find such a group in your local community.  Search for information online from trusted sources such as the American Heart Association: www.heart.org Summary  Before you leave the hospital, make sure you understand how to care for someone with heart failure.  It is normal to have many different emotions while caring for someone with heart failure.  Take care of yourself by asking for help and taking time to relax.  Monitor yourself for caregiver burnout and know when to ask for help. This information is not intended to replace advice given to you by your health care provider. Make sure you discuss any questions you have with your health care provider. Document Released: 01/19/2017 Document Revised: 01/19/2017 Document Reviewed: 01/19/2017 Elsevier Interactive Patient Education  2018 [employer].

## 2018-01-17 NOTE — Progress Notes (Signed)
01/17/2018 3:19 PM  Pt saying that she is feeling short of breath.  I repeated a chest xray and it was stable, no acute findings.  She is anxious.  Will hold her discharge and get her back on her home dose anxiety medication.  Will Hold her discharge until tomorrow so we can watch her overnight to be sure she remains stable.    Murvin Natal, MD

## 2018-01-17 NOTE — Discharge Summary (Addendum)
Physician Discharge Summary  Lori [name] VHQ:[id number] DOB: Oct 20, 1918 DOA: 01/15/2018  PCP: Eustaquio Maize, MD Cardiologist: Dr. Percival Spanish  Admit date: 01/15/2018 Discharge date: 01/18/2018  Admitted From: Home  Disposition: Home  Recommendations for Outpatient Follow-up:  1. Follow up with PCP in 1 weeks 2. Follow up with cardiology in 2 weeks.  3. Recheck BMP in 1- weeks.     Home Health: PT, RN DME Home Oxygen 2L/min  Discharge Condition: STABLE   CODE STATUS: FULL    Brief Hospitalization Summary: Please see all hospital notes, images, labs for full details of the hospitalization.  HPI: Lori [name] is a 84 y.o. female with medical history significant for systolic congestive heart failure with EF 30 to 35% on echo 10/2017 as well as mild LVH and grade 1 diastolic dysfunction, CAD with recent NSTEMI treated conservatively, hypertension, dyslipidemia, CKD stage III, and hypothyroidism who is brought to the emergency department at the recommendation of her PCP who evaluated her due to shortness of breath.  Chest x-ray at that time demonstrated some development of pleural effusions and her Lasix dose was increased to oral 40 mg twice a day for a couple days.  She was also noted to be anxious and was given some anxiolytic medications.  She continued to have ongoing symptoms and an elevated BNP was noted on lab work which then prompted the ED visit.  Notably, patient has been somewhat noncompliant with her Lasix after her husband died about a month ago after a prolonged stay at a hospice facility.  She also injured her left leg around that time and has had persistent swelling in that leg.  She has also gained approximately 6 pounds over the last several weeks and has little to no appetite.  She denies any chest pain, fever, chills, cough, abdominal pain, or vomiting.  She does have mild nausea on occasion.   ED Course: Vital signs are noted to be stable with some soft blood pressure  readings.  Hemoglobin 11.4, BUN 32, creatinine 1.25 with baseline near 1.  BNP is 2174 as checked at her PCPs office and troponin is 0.03.  EKG shows sinus rhythm at 81 bpm with some left atrial enlargement.  Two-view chest x-ray with some cardiomegaly and bilateral pleural effusions with diffuse pulmonary vascular congestion.  D-dimer was checked and elevated at 0.99.  Brief Admission Hx: Lori T Mitchellis a 84 y.o.femalewith medical history significant forsystolic congestive heart failure with EF 30 to 35% on echo 10/2017 as well as mild LVH and grade 1 diastolic dysfunction, CAD with recent NSTEMI treated conservatively, hypertension, dyslipidemia, CKD stage III, and hypothyroidism who is brought to the emergency department at the recommendation of her PCP who evaluated her due to shortness of breath.  MDM/Assessment & Plan:  1. Acute on chronic systolicand diastolicCHF. Patient appears to have been minimally compliant with her home Lasix use given the recent death of her husband. She was placed on IV lasix and diuresed very well.  Recent echo on 10/2017 with no need to currently repeat.  Resume home lasix 40 mg BID.  Follow up with Dr. Percival Spanish in 1 week.  2. Left lower extremity swelling. This is due to recent injury and there appears to be no calf tenderness, however d-dimer was evaluated and is 0.99 which is likely within normal range, age-adjusted. ultrasound of the lower extremity negative for DVT and V/Q scan not suggesting PE.   Swelling has resolved with diuresis.  3. CAD. Continue home  Coreg and Imdur as well as aspirin and Plavix. 4. Hypothyroidism. Continue Synthroid. 5. Dyslipidemia. Continue on heart healthy diet. 6. CKD stage III. She is at baseline renal status. 7. Anxiety - adjustment disorder from recent loss of husband in last month,  Pt still grieving.  Holding anxiolytics for now until patient can follow up with her PCP for recheck.  She became very sedated with the  lorazepam given in the hospital.  Holding all anxiolytics at discharge.  I spoke with her daughter and she does not take them regularly.    DVT prophylaxis:Heparin Code Status:Full Family Communication:Daughter at bedside Disposition Plan:HOME  Discharge Diagnoses:  Principal Problem:   Acute on chronic systolic CHF (congestive heart failure) (HCC) Active Problems:   CORONARY ATHEROSCLEROSIS NATIVE CORONARY ARTERY   Dyslipidemia   Hypothyroidism   CKD (chronic kidney disease), stage III (HCC)   CHF (congestive heart failure) (HCC)   Acute systolic (congestive) heart failure (HCC)  Discharge Instructions: Discharge Instructions    (HEART FAILURE PATIENTS) Call MD:  Anytime you have any of the following symptoms: 1) 3 pound weight gain in 24 hours or 5 pounds in 1 week 2) shortness of breath, with or without a dry hacking cough 3) swelling in the hands, feet or stomach 4) if you have to sleep on extra pillows at night in order to breathe.   Complete by:  As directed    (HEART FAILURE PATIENTS) Call MD:  Anytime you have any of the following symptoms: 1) 3 pound weight gain in 24 hours or 5 pounds in 1 week 2) shortness of breath, with or without a dry hacking cough 3) swelling in the hands, feet or stomach 4) if you have to sleep on extra pillows at night in order to breathe.   Complete by:  As directed    Call MD for:  difficulty breathing, headache or visual disturbances   Complete by:  As directed    Call MD for:  difficulty breathing, headache or visual disturbances   Complete by:  As directed    Call MD for:  extreme fatigue   Complete by:  As directed    Call MD for:  extreme fatigue   Complete by:  As directed    Call MD for:  persistant dizziness or light-headedness   Complete by:  As directed    Call MD for:  persistant dizziness or light-headedness   Complete by:  As directed    Diet - low sodium heart healthy   Complete by:  As directed    Diet - low sodium heart  healthy   Complete by:  As directed    Increase activity slowly   Complete by:  As directed    Increase activity slowly   Complete by:  As directed      Allergies as of 01/18/2018   No Known Allergies     Medication List    STOP taking these medications   LORazepam 0.5 MG tablet Commonly known as:  ATIVAN     TAKE these medications   aspirin 81 MG tablet Take 81 mg by mouth daily.   carvedilol 3.125 MG tablet Commonly known as:  COREG TAKE 1 TABLET BY MOUTH 2 TIMES A DAY WITH MEALS   clopidogrel 75 MG tablet Commonly known as:  PLAVIX Take 1 tablet (75 mg total) by mouth daily.   furosemide 40 MG tablet Commonly known as:  LASIX Take 0.5 tablets (20 mg total) by mouth daily as needed.  What changed:    how much to take  when to take this   isosorbide mononitrate 60 MG 24 hr tablet Commonly known as:  IMDUR Take 0.5 tablets (30 mg total) by mouth daily.   levothyroxine 50 MCG tablet Commonly known as:  SYNTHROID, LEVOTHROID Take 1 Tablet by mouth once daily before breakfast   nitroGLYCERIN 0.4 MG SL tablet Commonly known as:  NITROSTAT Place 1 tablet (0.4 mg total) under the tongue every 5 (five) minutes as needed for chest pain.            Durable Medical Equipment  (From admission, onward)        Start     Ordered   01/17/18 1226  For home use only DME oxygen  Once    Question Answer Comment  Mode or (Route) Nasal cannula   Liters per Minute 4   Frequency Continuous (stationary and portable oxygen unit needed)   Oxygen conserving device Yes   Oxygen delivery system Gas      01/17/18 1225     Follow-up Information    [facility]   Specialty:  Home Health Services Contact information: [address] [place] [address] [phone]        Eustaquio Maize, MD. Schedule an appointment as soon as possible for a visit in 1 week(s).   Specialty:  Pediatrics Why:  Hospital Follow Up  Contact  information: [place] [address] [phone]        Minus Breeding, MD. Schedule an appointment as soon as possible for a visit in 2 week(s).   Specialty:  Cardiology Contact information: [place] [address] [phone]          No Known Allergies Allergies as of 01/18/2018   No Known Allergies     Medication List    STOP taking these medications   LORazepam 0.5 MG tablet Commonly known as:  ATIVAN     TAKE these medications   aspirin 81 MG tablet Take 81 mg by mouth daily.   carvedilol 3.125 MG tablet Commonly known as:  COREG TAKE 1 TABLET BY MOUTH 2 TIMES A DAY WITH MEALS   clopidogrel 75 MG tablet Commonly known as:  PLAVIX Take 1 tablet (75 mg total) by mouth daily.   furosemide 40 MG tablet Commonly known as:  LASIX Take 0.5 tablets (20 mg total) by mouth daily as needed. What changed:    how much to take  when to take this   isosorbide mononitrate 60 MG 24 hr tablet Commonly known as:  IMDUR Take 0.5 tablets (30 mg total) by mouth daily.   levothyroxine 50 MCG tablet Commonly known as:  SYNTHROID, LEVOTHROID Take 1 Tablet by mouth once daily before breakfast   nitroGLYCERIN 0.4 MG SL tablet Commonly known as:  NITROSTAT Place 1 tablet (0.4 mg total) under the tongue every 5 (five) minutes as needed for chest pain.            Durable Medical Equipment  (From admission, onward)        Start     Ordered   01/17/18 1226  For home use only DME oxygen  Once    Question Answer Comment  Mode or (Route) Nasal cannula   Liters per Minute 4   Frequency Continuous (stationary and portable oxygen unit needed)   Oxygen conserving device Yes   Oxygen delivery system Gas  01/17/18 1225      Procedures/Studies: Dg Chest 2 View  Result Date: 01/15/2018 CLINICAL DATA:  Initial evaluation for acute shortness of breath. EXAM: CHEST - 2 VIEW COMPARISON:  Prior radiograph from 01/14/2018. FINDINGS: Stable  cardiomegaly. Mediastinal silhouette normal. Aortic atherosclerosis. Lungs hypoinflated. Small moderate bilateral pleural effusions, right greater than left. Associated bibasilar opacities favored to reflect atelectatic changes. Increased vascular congestion with interstitial prominence as compared to previous, suggesting developing pulmonary interstitial edema. No other focal airspace disease. No pneumothorax. Osseous structures are unchanged. IMPRESSION: 1. Cardiomegaly with slightly worsened diffuse pulmonary vascular congestion and interstitial prominence, suggesting mild and/or developing pulmonary interstitial edema. 2. Small moderate bilateral pleural effusions with associated atelectasis, right greater than left. 3. Aortic atherosclerosis. Electronically Signed   By: Jeannine Boga M.D.   On: 01/15/2018 20:58   Dg Chest 2 View  Result Date: 01/14/2018 CLINICAL DATA:  Short of breath, lower extremity edema, chest pain EXAM: CHEST - 2 VIEW COMPARISON:  Chest x-ray of 10/26/2017 FINDINGS: The lungs are not well aerated. Also there has been and increase in volume of bilateral pleural effusions with increase in bibasilar atelectasis. There does appear to be mild pulmonary vascular congestion present. Cardiomegaly is stable. There is moderate thoracic aortic atherosclerosis noted. No acute bony abnormality is present. IMPRESSION: 1. Diminished aeration with increasing volume of bilateral pleural effusions and increasing bibasilar atelectasis. 2. Cardiomegaly and probable mild pulmonary vascular congestion. Electronically Signed   By: Ivar Drape M.D.   On: 01/14/2018 11:48   Nm Pulmonary Perf And Vent  Result Date: 01/16/2018 CLINICAL DATA:  Positive D-dimer.  Evaluate for pulmonary embolism. EXAM: NUCLEAR MEDICINE VENTILATION - PERFUSION LUNG SCAN TECHNIQUE: Ventilation images were obtained in multiple projections using inhaled aerosol Tc-51m DTPA. Perfusion images were obtained in multiple  projections after intravenous injection of Tc-68m-MAA. RADIOPHARMACEUTICALS:  33.0 mCi of Tc-87m DTPA aerosol inhalation and 4.3 mCi Tc62m-MAA IV COMPARISON:  Chest x-ray 01/15/2018 FINDINGS: Ventilation: No focal ventilation defect. Mild decreased uptake over the posterior right base conforming to effusion on chest x-ray. Perfusion: No wedge shaped peripheral perfusion defects to suggest acute pulmonary embolism. Mild decreased uptake over the posterior right base conforming to pleural effusion on chest x-ray. IMPRESSION: Findings compatible with very low probability for pulmonary embolism. Electronically Signed   By: Marin Olp M.D.   On: 01/16/2018 15:22   US Venous Img Lower Unilateral Left  Result Date: 01/16/2018 CLINICAL DATA:  Left leg edema. EXAM: LEFT LOWER EXTREMITY VENOUS DOPPLER ULTRASOUND TECHNIQUE: Gray-scale sonography with graded compression, as well as color Doppler and duplex ultrasound were performed to evaluate the lower extremity deep venous systems from the level of the common femoral vein and including the common femoral, femoral, profunda femoral, popliteal and calf veins including the posterior tibial, peroneal and gastrocnemius veins when visible. The superficial great saphenous vein was also interrogated. Spectral Doppler was utilized to evaluate flow at rest and with distal augmentation maneuvers in the common femoral, femoral and popliteal veins. COMPARISON:  None. FINDINGS: Contralateral Common Femoral Vein: Respiratory phasicity is normal and symmetric with the symptomatic side. No evidence of thrombus. Normal compressibility. Common Femoral Vein: No evidence of thrombus. Normal compressibility, respiratory phasicity and response to augmentation. Saphenofemoral Junction: No evidence of thrombus. Normal compressibility and flow on color Doppler imaging. Profunda Femoral Vein: No evidence of thrombus. Normal compressibility and flow on color Doppler imaging. Femoral Vein: No  evidence of thrombus. Normal compressibility, respiratory phasicity and response to augmentation. Popliteal Vein: No  evidence of thrombus. Normal compressibility, respiratory phasicity and response to augmentation. Calf Veins: No evidence of thrombus. Normal compressibility and flow on color Doppler imaging. Superficial Great Saphenous Vein: No evidence of thrombus. Normal compressibility. Venous Reflux:  None. Other Findings:  None. IMPRESSION: No evidence of deep venous thrombosis.  Edema in the left calf. Electronically Signed   By: Dorise Bullion III M.D   On: 01/16/2018 10:51   Dg Chest Port 1 View  Result Date: 01/17/2018 CLINICAL DATA:  Dyspnea EXAM: PORTABLE CHEST 1 VIEW COMPARISON:  01/15/2018 FINDINGS: Chronic cardiomegaly. Congested appearance of vessels with small pleural effusions. No pneumothorax. No visible air bronchogram. IMPRESSION: 1. Stable from 2 days ago. 2. Cardiomegaly with vascular congestion and small effusions. Electronically Signed   By: Monte Fantasia M.D.   On: 01/17/2018 14:40     Subjective: Pt awake, alert, NAD, cooperative, says she slept well, no SOB, no chest pain.    Discharge Exam: Vitals:   01/18/18 0559 01/18/18 1112  BP: (!) 124/56 (!) 118/49  Pulse: 89   Resp:    Temp: 97.8 F (36.6 C)   SpO2: 92% 93%   Vitals:   01/17/18 1631 01/17/18 2054 01/18/18 0559 01/18/18 1112  BP: 99/63 115/64 (!) 124/56 (!) 118/49  Pulse: 89 86 89   Resp:  18    Temp:  98.2 F (36.8 C) 97.8 F (36.6 C)   TempSrc:  Oral Oral   SpO2: 95% 94% 92% 93%  Weight:      Height:       General: Pt is alert, awake, not in acute distress Cardiovascular:  Normal S1/S2 +, no rubs, no gallops Respiratory: CTA bilaterally, no wheezing, no rhonchi Abdominal: Soft, NT, ND, bowel sounds + Extremities: no pretibial edema, no cyanosis   The results of significant diagnostics from this hospitalization (including imaging, microbiology, ancillary and laboratory) are listed below for  reference.     Microbiology: No results found for this or any previous visit (from the past 240 hour(s)).   Labs: BNP (last 3 results) Recent Labs    06/30/17 0714 01/14/18 1200  BNP 665.4* 6,314.9*   Basic Metabolic Panel: Recent Labs  Lab 01/14/18 1200 01/15/18 2013 01/16/18 0641 01/17/18 0922  NA 139 136 141 134*  K 5.2 3.7 3.5 3.6  CL 99 96* 99* 91*  CO2 26 29 28 30   GLUCOSE 95 131* 91 93  BUN 23 32* 26* 28*  CREATININE 1.24* 1.25* 1.19* 1.24*  CALCIUM 8.7 8.4* 8.5* 8.5*  MG  --   --   --  1.9   Liver Function Tests: Recent Labs  Lab 01/14/18 1200  AST 74*  ALT 62*  ALKPHOS 138*  BILITOT 0.4  PROT 5.7*  ALBUMIN 3.7   No results for input(s): LIPASE, AMYLASE in the last 168 hours. No results for input(s): AMMONIA in the last 168 hours. CBC: Recent Labs  Lab 01/15/18 2013 01/16/18 0641  WBC 8.1 7.5  HGB 11.4* 11.5*  HCT 37.7 37.5  MCV 82.9 82.8  PLT 229 226   Cardiac Enzymes: No results for input(s): CKTOTAL, CKMB, CKMBINDEX, TROPONINI in the last 168 hours. BNP: Invalid input(s): POCBNP CBG: No results for input(s): GLUCAP in the last 168 hours. D-Dimer Recent Labs    01/15/18 2013  DDIMER 0.99*   Hgb A1c No results for input(s): HGBA1C in the last 72 hours. Lipid Profile No results for input(s): CHOL, HDL, LDLCALC, TRIG, CHOLHDL, LDLDIRECT in the last 72 hours. Thyroid function  studies No results for input(s): TSH, T4TOTAL, T3FREE, THYROIDAB in the last 72 hours.  Invalid input(s): FREET3 Anemia work up No results for input(s): VITAMINB12, FOLATE, FERRITIN, TIBC, IRON, RETICCTPCT in the last 72 hours. Urinalysis    Component Value Date/Time   COLORURINE STRAW (A) 01/15/2018 2300   APPEARANCEUR CLEAR 01/15/2018 2300   LABSPEC 1.008 01/15/2018 2300   PHURINE 7.0 01/15/2018 2300   GLUCOSEU NEGATIVE 01/15/2018 2300   HGBUR NEGATIVE 01/15/2018 2300   BILIRUBINUR NEGATIVE 01/15/2018 2300   BILIRUBINUR negative 10/08/2015 1725    KETONESUR NEGATIVE 01/15/2018 2300   PROTEINUR NEGATIVE 01/15/2018 2300   UROBILINOGEN negative 10/08/2015 1725   UROBILINOGEN 0.2 05/25/2013 1525   NITRITE NEGATIVE 01/15/2018 2300   LEUKOCYTESUR TRACE (A) 01/15/2018 2300   Sepsis Labs Invalid input(s): PROCALCITONIN,  WBC,  LACTICIDVEN Microbiology No results found for this or any previous visit (from the past 240 hour(s)).  Time coordinating discharge: 34 minutes  SIGNED:  Irwin Brakeman, MD  [facility] 01/18/2018, 12:32 PM Pager [phone]  If 7PM-7AM, please contact night-coverage [email or web address] Password [email or web address]

## 2018-01-17 NOTE — Plan of Care (Signed)
  Problem: Acute Rehab PT Goals(only PT should resolve) Goal: Pt Will Go Supine/Side To Sit Flowsheets (Taken 01/17/2018 0934) Pt will go Supine/Side to Sit: with modified independence Goal: Pt Will Go Sit To Supine/Side Flowsheets (Taken 01/17/2018 0934) Pt will go Sit to Supine/Side: with modified independence Goal: Patient Will Transfer Sit To/From Stand Flowsheets (Taken 01/17/2018 0934) Patient will transfer sit to/from stand: with modified independence Goal: Pt Will Transfer Bed To Chair/Chair To Bed Flowsheets (Taken 01/17/2018 0934) Pt will Transfer Bed to Chair/Chair to Bed: with modified independence Goal: Pt Will Ambulate Flowsheets (Taken 01/17/2018 0934) Pt will Ambulate: 100 feet;with supervision;with rolling walker     Geraldine Solar PT, DPT

## 2018-01-18 MED ORDER — SALINE SPRAY 0.65 % NA SOLN
1.0000 | NASAL | Status: DC | PRN
  Administered 2018-01-18: 1 via NASAL
  Filled 2018-01-18: qty 44

## 2018-01-18 MED ORDER — NITROGLYCERIN 0.4 MG SL SUBL
0.4000 mg | SUBLINGUAL_TABLET | SUBLINGUAL | Status: DC | PRN
  Filled 2018-01-18: qty 1

## 2018-01-18 NOTE — Progress Notes (Signed)
Patient was ordered nitro per MD.Patient refused nitro,Dr Wynetta Emery notified.Will continue to monitor patient.

## 2018-01-18 NOTE — Progress Notes (Signed)
01/18/2018 9:04 AM  Pt was seen and examined. She says that she did not feel short of breath anymore.  She says that she normally would take a nitroglycerin if she got sob at home.  She is well diuresed.  I think she is ok to discharge home today.   Murvin Natal MD

## 2018-01-19 DIAGNOSIS — I5030 Unspecified diastolic (congestive) heart failure: Secondary | ICD-10-CM | POA: Diagnosis not present

## 2018-01-19 DIAGNOSIS — E785 Hyperlipidemia, unspecified: Secondary | ICD-10-CM | POA: Diagnosis not present

## 2018-01-19 DIAGNOSIS — I2510 Atherosclerotic heart disease of native coronary artery without angina pectoris: Secondary | ICD-10-CM | POA: Diagnosis not present

## 2018-01-19 DIAGNOSIS — Z7982 Long term (current) use of aspirin: Secondary | ICD-10-CM | POA: Diagnosis not present

## 2018-01-19 DIAGNOSIS — E039 Hypothyroidism, unspecified: Secondary | ICD-10-CM | POA: Diagnosis not present

## 2018-01-19 DIAGNOSIS — I5023 Acute on chronic systolic (congestive) heart failure: Secondary | ICD-10-CM | POA: Diagnosis not present

## 2018-01-19 DIAGNOSIS — Z7902 Long term (current) use of antithrombotics/antiplatelets: Secondary | ICD-10-CM | POA: Diagnosis not present

## 2018-01-19 DIAGNOSIS — N183 Chronic kidney disease, stage 3 (moderate): Secondary | ICD-10-CM | POA: Diagnosis not present

## 2018-01-19 DIAGNOSIS — M1990 Unspecified osteoarthritis, unspecified site: Secondary | ICD-10-CM | POA: Diagnosis not present

## 2018-01-19 DIAGNOSIS — I130 Hypertensive heart and chronic kidney disease with heart failure and stage 1 through stage 4 chronic kidney disease, or unspecified chronic kidney disease: Secondary | ICD-10-CM | POA: Diagnosis not present

## 2018-01-21 DIAGNOSIS — I130 Hypertensive heart and chronic kidney disease with heart failure and stage 1 through stage 4 chronic kidney disease, or unspecified chronic kidney disease: Secondary | ICD-10-CM | POA: Diagnosis not present

## 2018-01-21 DIAGNOSIS — I5030 Unspecified diastolic (congestive) heart failure: Secondary | ICD-10-CM | POA: Diagnosis not present

## 2018-01-21 DIAGNOSIS — I5023 Acute on chronic systolic (congestive) heart failure: Secondary | ICD-10-CM | POA: Diagnosis not present

## 2018-01-21 DIAGNOSIS — M1990 Unspecified osteoarthritis, unspecified site: Secondary | ICD-10-CM | POA: Diagnosis not present

## 2018-01-21 DIAGNOSIS — N183 Chronic kidney disease, stage 3 (moderate): Secondary | ICD-10-CM | POA: Diagnosis not present

## 2018-01-21 DIAGNOSIS — I2510 Atherosclerotic heart disease of native coronary artery without angina pectoris: Secondary | ICD-10-CM | POA: Diagnosis not present

## 2018-01-24 DIAGNOSIS — I5023 Acute on chronic systolic (congestive) heart failure: Secondary | ICD-10-CM | POA: Diagnosis not present

## 2018-01-24 DIAGNOSIS — I2510 Atherosclerotic heart disease of native coronary artery without angina pectoris: Secondary | ICD-10-CM | POA: Diagnosis not present

## 2018-01-24 DIAGNOSIS — I130 Hypertensive heart and chronic kidney disease with heart failure and stage 1 through stage 4 chronic kidney disease, or unspecified chronic kidney disease: Secondary | ICD-10-CM | POA: Diagnosis not present

## 2018-01-24 DIAGNOSIS — N183 Chronic kidney disease, stage 3 (moderate): Secondary | ICD-10-CM | POA: Diagnosis not present

## 2018-01-24 DIAGNOSIS — M1990 Unspecified osteoarthritis, unspecified site: Secondary | ICD-10-CM | POA: Diagnosis not present

## 2018-01-24 DIAGNOSIS — I5030 Unspecified diastolic (congestive) heart failure: Secondary | ICD-10-CM | POA: Diagnosis not present

## 2018-01-25 ENCOUNTER — Encounter: Payer: Self-pay | Admitting: Cardiology

## 2018-01-25 DIAGNOSIS — M1990 Unspecified osteoarthritis, unspecified site: Secondary | ICD-10-CM | POA: Diagnosis not present

## 2018-01-25 DIAGNOSIS — I130 Hypertensive heart and chronic kidney disease with heart failure and stage 1 through stage 4 chronic kidney disease, or unspecified chronic kidney disease: Secondary | ICD-10-CM | POA: Diagnosis not present

## 2018-01-25 DIAGNOSIS — I2510 Atherosclerotic heart disease of native coronary artery without angina pectoris: Secondary | ICD-10-CM | POA: Diagnosis not present

## 2018-01-25 DIAGNOSIS — I5030 Unspecified diastolic (congestive) heart failure: Secondary | ICD-10-CM | POA: Diagnosis not present

## 2018-01-25 DIAGNOSIS — I5023 Acute on chronic systolic (congestive) heart failure: Secondary | ICD-10-CM | POA: Diagnosis not present

## 2018-01-25 DIAGNOSIS — N183 Chronic kidney disease, stage 3 (moderate): Secondary | ICD-10-CM | POA: Diagnosis not present

## 2018-01-25 NOTE — Progress Notes (Signed)
HPIhas The patient presents for evaluation of CHF.  She has a history of reduced EF and was in the hosptial in Feb with NSTEMI.  She was managed medically.  She was again in the hospital in April with acute on chronic systolic HF following the death of her husband.  She had missed some Lasix dosing prior to that.  I reviewed these records for this visit.  Since leaving the hospital she has done OK.  Her weight has stayed down.  The patient denies any new symptoms such as chest discomfort, neck or arm discomfort. There has been no new shortness of breath, PND or orthopnea. There have been no reported palpitations, presyncope or syncope.  She lives by herself but her family is there all of the time.    Allergies  Allergen Reactions  . Ativan [Lorazepam]     Very sensitive to Ativan.     Current Outpatient Medications  Medication Sig Dispense Refill  . aspirin 81 MG tablet Take 81 mg by mouth daily.      . carvedilol (COREG) 3.125 MG tablet TAKE 1 TABLET BY MOUTH 2 TIMES A DAY WITH MEALS 180 tablet 0  . clopidogrel (PLAVIX) 75 MG tablet Take 1 tablet (75 mg total) by mouth daily. 90 tablet 0  . furosemide (LASIX) 40 MG tablet Take 40 mg by mouth daily.    . isosorbide mononitrate (IMDUR) 60 MG 24 hr tablet Take 0.5 tablets (30 mg total) by mouth daily.    [name] Kitchen levothyroxine (SYNTHROID, LEVOTHROID) 50 MCG tablet Take 1 Tablet by mouth once daily before breakfast 90 tablet 1  . nitroGLYCERIN (NITROSTAT) 0.4 MG SL tablet Place 1 tablet (0.4 mg total) under the tongue every 5 (five) minutes as needed for chest pain. 25 tablet 4   No current facility-administered medications for this visit.     Past Medical History:  Diagnosis Date  . Arthritis   . CAD (coronary artery disease)    a. NSTEMI in 2011 s/p PCI/DES to distal Cx, s/p PCI/DES to OM2 (EF 40%). b. NSTEMI 02/2012 treated conservatively. c. NSEMI 10/2017 managed medically.   . Carotid artery stenosis    Left carotid endarterectomy 02/2001    . CHF (congestive heart failure) ([place])    a. EF 40% in 2011. b. EF 35-40% by echo 03/12/12.  EF 3% 2019  . Hyperlipidemia   . Hypertension   . Hyponatremia    Noted on 02/2012 admission  . Pneumonia    02/2012  . Renal insufficiency    Noted on 02/2012 admission  . Respiratory failure ([place])    02/2012 - requiring bipap - multifactorial in setting of PNA/CHF/NSTEMI  . Thyroid disease    Euthyroid 02/2012    Past Surgical History:  Procedure Laterality Date  . CARDIAC CATHETERIZATION    . CAROTID ENDARTERECTOMY     left 02/2001    ROS   As stated in the HPI and negative for all other systems.   PHYSICAL EXAM BP 112/60   Pulse 88   Ht 5\' 4"  (1.626 m)   Wt 115 lb (52.2 kg)   BMI 19.74 kg/m   GENERAL:  Frail appearing NECK:  No jugular venous distention, waveform within normal limits, carotid upstroke brisk and symmetric, no bruits, no thyromegaly LUNGS:  Clear to auscultation bilaterally CHEST:  Unremarkable HEART:  PMI not displaced or sustained,S1 and S2 within normal limits, no S3, no S4, no clicks, no rubs, no murmurs ABD:  Flat, positive bowel sounds  normal in frequency in pitch, no bruits, no rebound, no guarding, no midline pulsatile mass, no hepatomegaly, no splenomegaly EXT:  2 plus pulses throughout, no edema, no cyanosis no clubbing    EKG: NA  ASSESSMENT AND PLAN  Coronary artery disease  The patient has no new sypmtoms.  No further cardiovascular testing is indicated.  We will continue with aggressive risk reduction and meds as listed.  I will continue DAPT.   Ischemic cardiomyopathy  She is euvolemic.  I will check a BMET today.    Chronic kidney disease  Creat was 1.0.   As above.   Hypertension  The blood pressure is at target.   Syncope She has had no further events.   Hospital records reviewed.

## 2018-01-26 ENCOUNTER — Other Ambulatory Visit: Payer: Medicare Other

## 2018-01-26 ENCOUNTER — Encounter: Payer: Self-pay | Admitting: Cardiology

## 2018-01-26 ENCOUNTER — Ambulatory Visit (INDEPENDENT_AMBULATORY_CARE_PROVIDER_SITE_OTHER): Payer: Medicare Other | Admitting: Cardiology

## 2018-01-26 VITALS — BP 112/60 | HR 88 | Ht 64.0 in | Wt 115.0 lb

## 2018-01-26 DIAGNOSIS — I5043 Acute on chronic combined systolic (congestive) and diastolic (congestive) heart failure: Secondary | ICD-10-CM | POA: Diagnosis not present

## 2018-01-26 DIAGNOSIS — I5022 Chronic systolic (congestive) heart failure: Secondary | ICD-10-CM

## 2018-01-26 DIAGNOSIS — Z79899 Other long term (current) drug therapy: Secondary | ICD-10-CM | POA: Diagnosis not present

## 2018-01-26 DIAGNOSIS — I2510 Atherosclerotic heart disease of native coronary artery without angina pectoris: Secondary | ICD-10-CM

## 2018-01-26 DIAGNOSIS — I10 Essential (primary) hypertension: Secondary | ICD-10-CM

## 2018-01-26 LAB — BASIC METABOLIC PANEL
BUN/Creatinine Ratio: 19 (ref 12–28)
BUN: 24 mg/dL (ref 10–36)
CALCIUM: 9 mg/dL (ref 8.7–10.3)
CO2: 25 mmol/L (ref 20–29)
CREATININE: 1.25 mg/dL — AB (ref 0.57–1.00)
Chloride: 97 mmol/L (ref 96–106)
GFR calc Af Amer: 42 mL/min/{1.73_m2} — ABNORMAL LOW (ref >59)
GFR, EST NON AFRICAN AMERICAN: 36 mL/min/{1.73_m2} — AB (ref >59)
Glucose: 96 mg/dL (ref 65–99)
Potassium: 4.6 mmol/L (ref 3.5–5.2)
Sodium: 139 mmol/L (ref 134–144)

## 2018-01-26 NOTE — Patient Instructions (Signed)
Medication Instructions:  The current medical regimen is effective;  continue present plan and medications.  Please have blood work today (BMP)  Follow-Up: Follow up in 4 months with Dr Percival Spanish.  If you need a refill on your cardiac medications before your next appointment, please call your pharmacy.  Thank you for choosing [facility]!!

## 2018-01-27 DIAGNOSIS — M1990 Unspecified osteoarthritis, unspecified site: Secondary | ICD-10-CM | POA: Diagnosis not present

## 2018-01-27 DIAGNOSIS — I5023 Acute on chronic systolic (congestive) heart failure: Secondary | ICD-10-CM | POA: Diagnosis not present

## 2018-01-27 DIAGNOSIS — N183 Chronic kidney disease, stage 3 (moderate): Secondary | ICD-10-CM | POA: Diagnosis not present

## 2018-01-27 DIAGNOSIS — I2510 Atherosclerotic heart disease of native coronary artery without angina pectoris: Secondary | ICD-10-CM | POA: Diagnosis not present

## 2018-01-27 DIAGNOSIS — I130 Hypertensive heart and chronic kidney disease with heart failure and stage 1 through stage 4 chronic kidney disease, or unspecified chronic kidney disease: Secondary | ICD-10-CM | POA: Diagnosis not present

## 2018-01-27 DIAGNOSIS — I5030 Unspecified diastolic (congestive) heart failure: Secondary | ICD-10-CM | POA: Diagnosis not present

## 2018-02-03 DIAGNOSIS — I5030 Unspecified diastolic (congestive) heart failure: Secondary | ICD-10-CM | POA: Diagnosis not present

## 2018-02-03 DIAGNOSIS — N183 Chronic kidney disease, stage 3 (moderate): Secondary | ICD-10-CM | POA: Diagnosis not present

## 2018-02-03 DIAGNOSIS — I130 Hypertensive heart and chronic kidney disease with heart failure and stage 1 through stage 4 chronic kidney disease, or unspecified chronic kidney disease: Secondary | ICD-10-CM | POA: Diagnosis not present

## 2018-02-03 DIAGNOSIS — I5023 Acute on chronic systolic (congestive) heart failure: Secondary | ICD-10-CM | POA: Diagnosis not present

## 2018-02-03 DIAGNOSIS — M1990 Unspecified osteoarthritis, unspecified site: Secondary | ICD-10-CM | POA: Diagnosis not present

## 2018-02-03 DIAGNOSIS — I2510 Atherosclerotic heart disease of native coronary artery without angina pectoris: Secondary | ICD-10-CM | POA: Diagnosis not present

## 2018-02-06 DIAGNOSIS — R0682 Tachypnea, not elsewhere classified: Secondary | ICD-10-CM | POA: Diagnosis not present

## 2018-02-07 ENCOUNTER — Inpatient Hospital Stay (HOSPITAL_COMMUNITY)
Admission: EM | Admit: 2018-02-07 | Discharge: 2018-02-10 | DRG: 291 | Disposition: A | Discharge status: Hospice | Payer: Medicare Other | Attending: Internal Medicine | Admitting: Internal Medicine

## 2018-02-07 ENCOUNTER — Encounter (HOSPITAL_COMMUNITY): Payer: Self-pay | Admitting: Emergency Medicine

## 2018-02-07 ENCOUNTER — Other Ambulatory Visit: Payer: Self-pay

## 2018-02-07 ENCOUNTER — Emergency Department (HOSPITAL_COMMUNITY): Payer: Medicare Other

## 2018-02-07 DIAGNOSIS — J9620 Acute and chronic respiratory failure, unspecified whether with hypoxia or hypercapnia: Secondary | ICD-10-CM | POA: Diagnosis not present

## 2018-02-07 DIAGNOSIS — N189 Chronic kidney disease, unspecified: Secondary | ICD-10-CM | POA: Diagnosis present

## 2018-02-07 DIAGNOSIS — Z7982 Long term (current) use of aspirin: Secondary | ICD-10-CM | POA: Diagnosis not present

## 2018-02-07 DIAGNOSIS — I130 Hypertensive heart and chronic kidney disease with heart failure and stage 1 through stage 4 chronic kidney disease, or unspecified chronic kidney disease: Principal | ICD-10-CM | POA: Diagnosis present

## 2018-02-07 DIAGNOSIS — E039 Hypothyroidism, unspecified: Secondary | ICD-10-CM | POA: Diagnosis not present

## 2018-02-07 DIAGNOSIS — Z79890 Hormone replacement therapy (postmenopausal): Secondary | ICD-10-CM

## 2018-02-07 DIAGNOSIS — N183 Chronic kidney disease, stage 3 (moderate): Secondary | ICD-10-CM | POA: Diagnosis present

## 2018-02-07 DIAGNOSIS — Z7902 Long term (current) use of antithrombotics/antiplatelets: Secondary | ICD-10-CM | POA: Diagnosis not present

## 2018-02-07 DIAGNOSIS — F411 Generalized anxiety disorder: Secondary | ICD-10-CM

## 2018-02-07 DIAGNOSIS — E785 Hyperlipidemia, unspecified: Secondary | ICD-10-CM | POA: Diagnosis present

## 2018-02-07 DIAGNOSIS — R531 Weakness: Secondary | ICD-10-CM | POA: Diagnosis not present

## 2018-02-07 DIAGNOSIS — I519 Heart disease, unspecified: Secondary | ICD-10-CM | POA: Diagnosis not present

## 2018-02-07 DIAGNOSIS — I2510 Atherosclerotic heart disease of native coronary artery without angina pectoris: Secondary | ICD-10-CM | POA: Diagnosis not present

## 2018-02-07 DIAGNOSIS — J9621 Acute and chronic respiratory failure with hypoxia: Secondary | ICD-10-CM | POA: Diagnosis present

## 2018-02-07 DIAGNOSIS — E440 Moderate protein-calorie malnutrition: Secondary | ICD-10-CM

## 2018-02-07 DIAGNOSIS — Z515 Encounter for palliative care: Secondary | ICD-10-CM | POA: Diagnosis not present

## 2018-02-07 DIAGNOSIS — M48061 Spinal stenosis, lumbar region without neurogenic claudication: Secondary | ICD-10-CM | POA: Diagnosis present

## 2018-02-07 DIAGNOSIS — I509 Heart failure, unspecified: Secondary | ICD-10-CM

## 2018-02-07 DIAGNOSIS — Z634 Disappearance and death of family member: Secondary | ICD-10-CM | POA: Diagnosis not present

## 2018-02-07 DIAGNOSIS — I5023 Acute on chronic systolic (congestive) heart failure: Secondary | ICD-10-CM | POA: Diagnosis not present

## 2018-02-07 DIAGNOSIS — M1990 Unspecified osteoarthritis, unspecified site: Secondary | ICD-10-CM | POA: Diagnosis present

## 2018-02-07 DIAGNOSIS — I255 Ischemic cardiomyopathy: Secondary | ICD-10-CM | POA: Diagnosis not present

## 2018-02-07 DIAGNOSIS — Z8249 Family history of ischemic heart disease and other diseases of the circulatory system: Secondary | ICD-10-CM | POA: Diagnosis not present

## 2018-02-07 DIAGNOSIS — Z66 Do not resuscitate: Secondary | ICD-10-CM | POA: Diagnosis present

## 2018-02-07 DIAGNOSIS — Z7189 Other specified counseling: Secondary | ICD-10-CM | POA: Diagnosis not present

## 2018-02-07 DIAGNOSIS — I252 Old myocardial infarction: Secondary | ICD-10-CM | POA: Diagnosis not present

## 2018-02-07 DIAGNOSIS — Z9861 Coronary angioplasty status: Secondary | ICD-10-CM

## 2018-02-07 DIAGNOSIS — I493 Ventricular premature depolarization: Secondary | ICD-10-CM | POA: Diagnosis present

## 2018-02-07 DIAGNOSIS — R0789 Other chest pain: Secondary | ICD-10-CM | POA: Diagnosis not present

## 2018-02-07 DIAGNOSIS — Z79899 Other long term (current) drug therapy: Secondary | ICD-10-CM | POA: Diagnosis not present

## 2018-02-07 DIAGNOSIS — Z681 Body mass index (BMI) 19 or less, adult: Secondary | ICD-10-CM

## 2018-02-07 DIAGNOSIS — I110 Hypertensive heart disease with heart failure: Secondary | ICD-10-CM | POA: Diagnosis not present

## 2018-02-07 DIAGNOSIS — I5043 Acute on chronic combined systolic (congestive) and diastolic (congestive) heart failure: Secondary | ICD-10-CM | POA: Diagnosis present

## 2018-02-07 DIAGNOSIS — Z09 Encounter for follow-up examination after completed treatment for conditions other than malignant neoplasm: Secondary | ICD-10-CM

## 2018-02-07 DIAGNOSIS — Z8261 Family history of arthritis: Secondary | ICD-10-CM

## 2018-02-07 DIAGNOSIS — I10 Essential (primary) hypertension: Secondary | ICD-10-CM | POA: Diagnosis not present

## 2018-02-07 DIAGNOSIS — Z888 Allergy status to other drugs, medicaments and biological substances status: Secondary | ICD-10-CM

## 2018-02-07 DIAGNOSIS — R0602 Shortness of breath: Secondary | ICD-10-CM | POA: Diagnosis not present

## 2018-02-07 DIAGNOSIS — G4700 Insomnia, unspecified: Secondary | ICD-10-CM | POA: Diagnosis present

## 2018-02-07 DIAGNOSIS — R079 Chest pain, unspecified: Secondary | ICD-10-CM | POA: Diagnosis not present

## 2018-02-07 DIAGNOSIS — R1310 Dysphagia, unspecified: Secondary | ICD-10-CM | POA: Diagnosis not present

## 2018-02-07 DIAGNOSIS — R42 Dizziness and giddiness: Secondary | ICD-10-CM | POA: Diagnosis not present

## 2018-02-07 DIAGNOSIS — H9190 Unspecified hearing loss, unspecified ear: Secondary | ICD-10-CM | POA: Diagnosis present

## 2018-02-07 LAB — URINALYSIS, ROUTINE W REFLEX MICROSCOPIC
Bilirubin Urine: NEGATIVE
Glucose, UA: NEGATIVE mg/dL
HGB URINE DIPSTICK: NEGATIVE
Ketones, ur: NEGATIVE mg/dL
LEUKOCYTES UA: NEGATIVE
Nitrite: NEGATIVE
Protein, ur: NEGATIVE mg/dL
SPECIFIC GRAVITY, URINE: 1.006 (ref 1.005–1.030)
pH: 6 (ref 5.0–8.0)

## 2018-02-07 LAB — COMPREHENSIVE METABOLIC PANEL
ALBUMIN: 2.9 g/dL — AB (ref 3.5–5.0)
ALT: 50 U/L (ref 14–54)
ANION GAP: 12 (ref 5–15)
AST: 79 U/L — ABNORMAL HIGH (ref 15–41)
Alkaline Phosphatase: 119 U/L (ref 38–126)
BILIRUBIN TOTAL: 1.2 mg/dL (ref 0.3–1.2)
BUN: 25 mg/dL — ABNORMAL HIGH (ref 6–20)
CO2: 25 mmol/L (ref 22–32)
Calcium: 8.5 mg/dL — ABNORMAL LOW (ref 8.9–10.3)
Chloride: 101 mmol/L (ref 101–111)
Creatinine, Ser: 1.21 mg/dL — ABNORMAL HIGH (ref 0.44–1.00)
GFR calc Af Amer: 42 mL/min — ABNORMAL LOW (ref >60)
GFR, EST NON AFRICAN AMERICAN: 36 mL/min — AB (ref >60)
Glucose, Bld: 142 mg/dL — ABNORMAL HIGH (ref 65–99)
POTASSIUM: 4.4 mmol/L (ref 3.5–5.1)
Sodium: 138 mmol/L (ref 135–145)
TOTAL PROTEIN: 5.8 g/dL — AB (ref 6.5–8.1)

## 2018-02-07 LAB — CBC WITH DIFFERENTIAL/PLATELET
Abs Immature Granulocytes: 0.1 10*3/uL (ref 0.0–0.1)
Basophils Absolute: 0.1 10*3/uL (ref 0.0–0.1)
Basophils Relative: 1 %
EOS ABS: 0.5 10*3/uL (ref 0.0–0.7)
EOS PCT: 4 %
HEMATOCRIT: 41.7 % (ref 36.0–46.0)
Hemoglobin: 12.4 g/dL (ref 12.0–15.0)
Immature Granulocytes: 0 %
LYMPHS ABS: 2.8 10*3/uL (ref 0.7–4.0)
Lymphocytes Relative: 25 %
MCH: 25.5 pg — ABNORMAL LOW (ref 26.0–34.0)
MCHC: 29.7 g/dL — AB (ref 30.0–36.0)
MCV: 85.8 fL (ref 78.0–100.0)
MONOS PCT: 8 %
Monocytes Absolute: 0.9 10*3/uL (ref 0.1–1.0)
NEUTROS ABS: 6.9 10*3/uL (ref 1.7–7.7)
Neutrophils Relative %: 62 %
Platelets: 266 10*3/uL (ref 150–400)
RBC: 4.86 MIL/uL (ref 3.87–5.11)
RDW: 16.6 % — AB (ref 11.5–15.5)
WBC: 11.2 10*3/uL — ABNORMAL HIGH (ref 4.0–10.5)

## 2018-02-07 LAB — I-STAT ARTERIAL BLOOD GAS, ED
Acid-Base Excess: 4 mmol/L — ABNORMAL HIGH (ref 0.0–2.0)
Bicarbonate: 29 mmol/L — ABNORMAL HIGH (ref 20.0–28.0)
O2 SAT: 98 %
PH ART: 7.412 (ref 7.350–7.450)
TCO2: 30 mmol/L (ref 22–32)
pCO2 arterial: 45.4 mmHg (ref 32.0–48.0)
pO2, Arterial: 104 mmHg (ref 83.0–108.0)

## 2018-02-07 LAB — I-STAT TROPONIN, ED: TROPONIN I, POC: 0.04 ng/mL (ref 0.00–0.08)

## 2018-02-07 LAB — MRSA PCR SCREENING: MRSA BY PCR: NEGATIVE

## 2018-02-07 LAB — BRAIN NATRIURETIC PEPTIDE: B Natriuretic Peptide: 2034 pg/mL — ABNORMAL HIGH (ref 0.0–100.0)

## 2018-02-07 MED ORDER — LEVOTHYROXINE SODIUM 50 MCG PO TABS
50.0000 ug | ORAL_TABLET | Freq: Every day | ORAL | Status: DC
  Administered 2018-02-08 – 2018-02-10 (×3): 50 ug via ORAL
  Filled 2018-02-07 (×5): qty 1

## 2018-02-07 MED ORDER — ASPIRIN 81 MG PO CHEW
81.0000 mg | CHEWABLE_TABLET | Freq: Every day | ORAL | Status: DC
  Administered 2018-02-07 – 2018-02-10 (×4): 81 mg via ORAL
  Filled 2018-02-07 (×4): qty 1

## 2018-02-07 MED ORDER — FUROSEMIDE 10 MG/ML IJ SOLN
40.0000 mg | Freq: Once | INTRAMUSCULAR | Status: AC
  Administered 2018-02-07: 40 mg via INTRAVENOUS
  Filled 2018-02-07: qty 4

## 2018-02-07 MED ORDER — ISOSORBIDE MONONITRATE ER 30 MG PO TB24
30.0000 mg | ORAL_TABLET | Freq: Every day | ORAL | Status: DC
  Administered 2018-02-08 – 2018-02-10 (×3): 30 mg via ORAL
  Filled 2018-02-07 (×3): qty 1

## 2018-02-07 MED ORDER — HYDRALAZINE HCL 20 MG/ML IJ SOLN: 5.0000 mg | Freq: Three times a day (TID) | INTRAMUSCULAR | Status: DC | PRN

## 2018-02-07 MED ORDER — ACETAMINOPHEN 325 MG PO TABS: 650.0000 mg | ORAL_TABLET | ORAL | Status: DC | PRN

## 2018-02-07 MED ORDER — HEPARIN SODIUM (PORCINE) 5000 UNIT/ML IJ SOLN
5000.0000 [IU] | Freq: Three times a day (TID) | INTRAMUSCULAR | Status: DC
  Administered 2018-02-07 – 2018-02-10 (×9): 5000 [IU] via SUBCUTANEOUS
  Filled 2018-02-07 (×10): qty 1

## 2018-02-07 MED ORDER — IPRATROPIUM-ALBUTEROL 0.5-2.5 (3) MG/3ML IN SOLN: 3.0000 mL | Freq: Four times a day (QID) | RESPIRATORY_TRACT | Status: DC | PRN

## 2018-02-07 MED ORDER — NITROGLYCERIN 0.4 MG SL SUBL
0.4000 mg | SUBLINGUAL_TABLET | SUBLINGUAL | Status: DC | PRN
  Administered 2018-02-07 – 2018-02-08 (×4): 0.4 mg via SUBLINGUAL
  Filled 2018-02-07 (×2): qty 1

## 2018-02-07 MED ORDER — ENSURE ENLIVE PO LIQD
237.0000 mL | Freq: Two times a day (BID) | ORAL | Status: DC
  Administered 2018-02-07 – 2018-02-08 (×3): 237 mL via ORAL

## 2018-02-07 MED ORDER — ISOSORBIDE MONONITRATE ER 30 MG PO TB24
30.0000 mg | ORAL_TABLET | Freq: Every day | ORAL | Status: DC
Start: 2018-02-07 — End: 2018-02-07

## 2018-02-07 MED ORDER — CLOPIDOGREL BISULFATE 75 MG PO TABS
75.0000 mg | ORAL_TABLET | Freq: Every day | ORAL | Status: DC
  Administered 2018-02-07 – 2018-02-10 (×4): 75 mg via ORAL
  Filled 2018-02-07 (×5): qty 1

## 2018-02-07 MED ORDER — ONDANSETRON HCL 4 MG/2ML IJ SOLN: 4.0000 mg | Freq: Four times a day (QID) | INTRAMUSCULAR | Status: DC | PRN

## 2018-02-07 MED ORDER — ASPIRIN 81 MG PO CHEW
324.0000 mg | CHEWABLE_TABLET | Freq: Once | ORAL | Status: AC
  Administered 2018-02-07: 324 mg via ORAL
  Filled 2018-02-07: qty 4

## 2018-02-07 NOTE — ED Notes (Signed)
Pt resting in bed, breathing is still labored, pt is tachypneic, able to speak in 3-4 word sentences. Pt is axox4. Daughter is at bedside. Breath sounds clear and diminished. Pt is on 2L [place] at this time which she wears at home PRN per the daughter. Pt is going on bipap when RT enters the room.

## 2018-02-07 NOTE — Progress Notes (Signed)
Palliative:  I will meet with Ms. Fornes and daughter, [name], tomorrow 5/21 1000 am at their request. Ms. Bebe Shaggy did not wish to talk today. Thank you for this consult.   No charge  Vinie Sill, NP Palliative Medicine Team Pager # [phone] (M-F 8a-5p) Team Phone # [phone] (Nights/Weekends)

## 2018-02-07 NOTE — ED Notes (Signed)
Pt wanted a break from bipap, pt taken off and given applesauce with oral meds, tolerated well. Back on 2L [place] at this time.

## 2018-02-07 NOTE — ED Triage Notes (Signed)
Brought by ems from home for c/o SOB and chest pressure.  Given ativen 0.1mg  at home.  Noted to be anxious on arrival.  Wears O2 at 2L at home.

## 2018-02-07 NOTE — ED Provider Notes (Signed)
[facility] EMERGENCY DEPARTMENT Provider Note   CSN: [id number] Arrival date & time: 02/07/18  0530     History   Chief Complaint Chief Complaint  Patient presents with  . Chest Pain  . Shortness of Breath    HPI Lori [name] is a 84 y.o. female.  Patient brought to the emergency department for evaluation of chest pain and shortness of breath.  EMS report that they have been called her house twice tonight.  Initially she seemed very anxious, vitals looked okay and she was not transported.  They were called back and she seemed to be more short of breath, now complaining of chest pain.  She reports it as a tightness and a heaviness across the chest, causing her to feel like she cannot breathe.  Patient is on oxygen continuously.  Her oxygen saturations were low compared to her normal, was placed on increased oxygen with some improvement of her saturation, but she is not expressing any symptomatic improvement.     Past Medical History:  Diagnosis Date  . Arthritis   . CAD (coronary artery disease)    a. NSTEMI in 2011 s/p PCI/DES to distal Cx, s/p PCI/DES to OM2 (EF 40%). b. NSTEMI 02/2012 treated conservatively. c. NSEMI 10/2017 managed medically.   . Carotid artery stenosis    Left carotid endarterectomy 02/2001  . CHF (congestive heart failure) ([place])    a. EF 40% in 2011. b. EF 35-40% by echo 03/12/12.  EF 3% 2019  . Hyperlipidemia   . Hypertension   . Hyponatremia    Noted on 02/2012 admission  . Pneumonia    02/2012  . Renal insufficiency    Noted on 02/2012 admission  . Respiratory failure ([place])    02/2012 - requiring bipap - multifactorial in setting of PNA/CHF/NSTEMI  . Thyroid disease    Euthyroid 02/2012    Patient Active Problem List   Diagnosis Date Noted  . Essential hypertension 01/26/2018  . Encounter for long-term (current) use of high-risk medication 01/26/2018  . Acute systolic (congestive) heart failure ([place]) 01/16/2018  . Acute on  chronic systolic CHF (congestive heart failure) ([place]) 01/15/2018  . CHF (congestive heart failure) ([place]) 01/15/2018  . Angina pectoris ([place]) 10/26/2017  . Chronic systolic CHF (congestive heart failure) ([place]) 10/26/2017  . CKD (chronic kidney disease), stage III ([place]) 10/26/2017  . Chronic combined systolic and diastolic CHF (congestive heart failure) ([place]) 10/26/2017  . Pressure injury of skin 07/02/2017  . Unstable angina ([place]) 06/30/2017  . Hypothyroidism 08/03/2016  . Venous (peripheral) insufficiency 03/19/2016  . Vertebrobasilar insufficiency 10/08/2015  . Cervical spondylosis without myelopathy 10/08/2015  . Carotid sinus syncope 10/08/2015  . History of Clostridium difficile 10/01/2014  . Hemorrhoids 06/11/2014  . Ileus ([place]) 08/08/2013  . Spinal stenosis of lumbar region 05/26/2013  . Chronic systolic heart failure ([place]) 03/21/2012  . Ischemic cardiomyopathy 03/21/2012  . CKD (chronic kidney disease) 03/21/2012  . NSTEMI (non-ST elevated myocardial infarction) ([place]) 03/12/2012  . Dyslipidemia 12/10/2010  . CORONARY ATHEROSCLEROSIS NATIVE CORONARY ARTERY 08/27/2010  . Accelerated hypertension 08/26/2010  . CAROTID STENOSIS 08/26/2010    Past Surgical History:  Procedure Laterality Date  . CARDIAC CATHETERIZATION    . CAROTID ENDARTERECTOMY     left 02/2001     OB History    Gravida  2   Para  2   Term  2   Preterm      AB      Living  2  SAB      TAB      Ectopic      Multiple      Live Births               Home Medications    Prior to Admission medications   Medication Sig Start Date End Date Taking? Authorizing Provider  aspirin 81 MG tablet Take 81 mg by mouth daily.      [provider]  carvedilol (COREG) 3.125 MG tablet TAKE 1 TABLET BY MOUTH 2 TIMES A DAY WITH MEALS 11/22/17   Minus Breeding, MD  clopidogrel (PLAVIX) 75 MG tablet Take 1 tablet (75 mg total) by mouth daily. 12/30/17   Eustaquio Maize, MD  furosemide  (LASIX) 40 MG tablet Take 40 mg by mouth daily.    [provider]  isosorbide mononitrate (IMDUR) 60 MG 24 hr tablet Take 0.5 tablets (30 mg total) by mouth daily. 01/17/18   Murlean Iba, MD  levothyroxine (SYNTHROID, LEVOTHROID) 50 MCG tablet Take 1 Tablet by mouth once daily before breakfast 09/08/17   Eustaquio Maize, MD  nitroGLYCERIN (NITROSTAT) 0.4 MG SL tablet Place 1 tablet (0.4 mg total) under the tongue every 5 (five) minutes as needed for chest pain. 12/21/17   Minus Breeding, MD    Family History Family History  Problem Relation Age of Onset  . Heart failure Mother 90  . Rheum arthritis Father        prostate  . Prostate cancer Unknown   . Coronary artery disease Brother 27       CABG, alive at 71  . Coronary artery disease Brother 76       Died  . Heart failure Sister   . Heart failure Sister     Social History Social History   Tobacco Use  . Smoking status: Never Smoker  . Smokeless tobacco: Never Used  Substance Use Topics  . Alcohol use: No  . Drug use: No     Allergies   Ativan [lorazepam]   Review of Systems Review of Systems  Respiratory: Positive for chest tightness and shortness of breath.   All other systems reviewed and are negative.    Physical Exam Updated Vital Signs BP (!) 165/90   Pulse (!) 113   Temp 98.1 F (36.7 C) (Oral)   Resp (!) 34   SpO2 95%   Physical Exam  Constitutional: She is oriented to person, place, and time. She appears well-developed and well-nourished. No distress.  HENT:  Head: Normocephalic and atraumatic.  Right Ear: Hearing normal.  Left Ear: Hearing normal.  Nose: Nose normal.  Mouth/Throat: Oropharynx is clear and moist and mucous membranes are normal.  Eyes: Pupils are equal, round, and reactive to light. Conjunctivae and EOM are normal.  Neck: Normal range of motion. Neck supple.  Cardiovascular: Regular rhythm, S1 normal and S2 normal. Exam reveals no gallop and no friction rub.    No murmur heard. Pulmonary/Chest: Accessory muscle usage present. Tachypnea noted. No respiratory distress. She has decreased breath sounds. She has wheezes. She has rales. She exhibits no tenderness.  Abdominal: Soft. Normal appearance and bowel sounds are normal. There is no hepatosplenomegaly. There is no tenderness. There is no rebound, no guarding, no tenderness at McBurney's point and negative Murphy's sign. No hernia.  Musculoskeletal: Normal range of motion.  Neurological: She is alert and oriented to person, place, and time. She has normal strength. No cranial nerve deficit or sensory deficit. Coordination normal.  GCS eye subscore is 4. GCS verbal subscore is 5. GCS motor subscore is 6.  Skin: Skin is warm, dry and intact. No rash noted. No cyanosis.  Psychiatric: She has a normal mood and affect. Her speech is normal and behavior is normal. Thought content normal.  Nursing note and vitals reviewed.    ED Treatments / Results  Labs (all labs ordered are listed, but only abnormal results are displayed) Labs Reviewed  CBC WITH DIFFERENTIAL/PLATELET - Abnormal; Notable for the following components:      Result Value   WBC 11.2 (*)    MCH 25.5 (*)    MCHC 29.7 (*)    RDW 16.6 (*)    All other components within normal limits  COMPREHENSIVE METABOLIC PANEL - Abnormal; Notable for the following components:   Glucose, Bld 142 (*)    BUN 25 (*)    Creatinine, Ser 1.21 (*)    Calcium 8.5 (*)    Total Protein 5.8 (*)    Albumin 2.9 (*)    AST 79 (*)    GFR calc non Af Amer 36 (*)    GFR calc Af Amer 42 (*)    All other components within normal limits  BRAIN NATRIURETIC PEPTIDE  I-STAT TROPONIN, ED  I-STAT ARTERIAL BLOOD GAS, ED    EKG EKG Interpretation  Date/Time:  Monday Feb 07 2018 05:41:40 EDT Ventricular Rate:  109 PR Interval:    QRS Duration: 94 QT Interval:  339 QTC Calculation: 457 R Axis:   41 Text Interpretation:  Sinus tachycardia Multiple premature  complexes, vent & supraven Anterior infarct, old Repol abnrm suggests ischemia, lateral leads, worse than previously seen Confirmed by Orpah Greek [id number]) on 02/07/2018 5:53:50 AM   Radiology Dg Chest Port 1 View  Result Date: 02/07/2018 CLINICAL DATA:  Increased shortness of breath tonight.  Chest pain. EXAM: PORTABLE CHEST 1 VIEW COMPARISON:  01/17/2018 FINDINGS: Unchanged cardiomegaly. Unchanged mediastinal contours with aortic atherosclerosis. Bilateral pleural effusions have increased, small to moderate. Increased vascular congestion, mild pulmonary edema. No pneumothorax. IMPRESSION: Worsening CHF with increasing pleural effusions, vascular congestion and pulmonary edema. Electronically Signed   By: Jeb Levering M.D.   On: 02/07/2018 06:43    Procedures Procedures (including critical care time)  Medications Ordered in ED Medications  nitroGLYCERIN (NITROSTAT) SL tablet 0.4 mg (0.4 mg Sublingual Given 02/07/18 4235)  aspirin chewable tablet 324 mg (324 mg Oral Given 02/07/18 0605)  furosemide (LASIX) injection 40 mg (40 mg Intravenous Given 02/07/18 3614)     Initial Impression / Assessment and Plan / ED Course  I have reviewed the triage vital signs and the nursing notes.  Pertinent labs & imaging results that were available during my care of the patient were reviewed by me and considered in my medical decision making (see chart for details).     Presents to the emergency department for evaluation of difficulty breathing.  Patient has a history of congestive heart failure.  She was recently hospitalized for CHF exacerbation at [facility].  She has a known ejection fraction of approximately 30%.  Patient is in mild distress at arrival.  She was treated with nitroglycerin and Lasix.  Work-up does show worsening congestive heart failure.  Still in some distress despite IV Lasix, sublingual nitroglycerin.  Will place on BiPAP, will require hospitalization.  CRITICAL  CARE Performed by: Orpah Greek   Total critical care time: 30 minutes  Critical care time was exclusive of separately billable procedures and  treating other patients.  Critical care was necessary to treat or prevent imminent or life-threatening deterioration.  Critical care was time spent personally by me on the following activities: development of treatment plan with patient and/or surrogate as well as nursing, discussions with consultants, evaluation of patient's response to treatment, examination of patient, obtaining history from patient or surrogate, ordering and performing treatments and interventions, ordering and review of laboratory studies, ordering and review of radiographic studies, pulse oximetry and re-evaluation of patient's condition.   Final Clinical Impressions(s) / ED Diagnoses   Final diagnoses:  Acute on chronic congestive heart failure, unspecified heart failure type ([place])  Acute on chronic respiratory failure, unspecified whether with hypoxia or hypercapnia [facility])    ED Discharge Orders    None       Orpah Greek, MD 02/07/18 [phone]

## 2018-02-07 NOTE — H&P (Addendum)
History and Physical    ELLER SWEIS BPZ:[id number] DOB: April 05, 1919 DOA: 02/07/2018   PCP: Eustaquio Maize, MD   Patient coming from:  Home    Chief Complaint: Shortness of breath   HPI: Lori [name] is a 84 y.o. female with medical history significant for systolic congestive heart failure, with EF 30 to 35%, per 2D echo in February 2019, as well as mild LVH with grade 1 diastolic dysfunction, CAD with recent NSTEMI treated conservatively, hypertension, hyperlipidemia, CKD stage III, hypothyroidism, recently admitted for CHF exacerbation from January 15, 2018 through January 17, 2018, discharged in stable condition after diuresis, nitroglycerin and breathing treatments. Per the report, the patient has been short of breath despite being compliant with her medications, especially over the last 24 hours.  She became very anxious, but she was not complaining of chest pain.  Information is taken by her daughter, as the patient is sound asleep at this time, arousing to voice, but not interacting.  Daughter states that EMS came, but the patient refused to be transported, to call then again shortly thereafter, at this time agreed to be seen at the emergency department.  Other information cannot be obtained, but daughter denies the patient having any chest pain, diaphoresisNo syncope or presyncope.  No  abdominal pain, nausea or vomiting, the patient makes smaller amount of urine, but did not complain of any dysuria or hematuria.  She has chronic lower extremity swelling, but not worse, and denies any calf pain.  Daughter denies the patient having any confusion.  No recent weight gain.  Daughter reports that the patient did not have any food indiscretions, and is compliant with her medications.  Of note, she was last seen by Dr. Percival Spanish, cardiology on 01/26/2018, at which time she was fairly stable.  No changes of medications were done at the time.  ED Course:  BP (!) 103/54   Pulse 87   Temp 98.1 F (36.7  C) (Oral)   Resp (!) 21   SpO2 100%   Troponin 0 0.04 EKG shows sinus tachycardia Multiple premature complexes, vent & supraven Anterior infarct, old Repol abnrm suggests ischemia, lateral leads, worse than previously seen White count 11.2 Glucose 142 Creatinine 1.21 ABGs bicarb 29, pH 7.4, PO2 104 Chest x-ray shows worsening CHF with increasing pleural effusions, vascular congestion and pulmonary edema. At the ED, the patient was given nitroglycerin, as well as Lasix 40 mg IV at 6:24 AM, with some improvement in her diuresis, the patient was placed on BiPAP, for comfort.  She seems to be more comfortable at the time of evaluation.  She is being admitted to stepdown for management of her symptoms.       Review of Systems:  As per HPI otherwise all other systems reviewed and are negative  Past Medical History:  Diagnosis Date  . Arthritis   . CAD (coronary artery disease)    a. NSTEMI in 2011 s/p PCI/DES to distal Cx, s/p PCI/DES to OM2 (EF 40%). b. NSTEMI 02/2012 treated conservatively. c. NSEMI 10/2017 managed medically.   . Carotid artery stenosis    Left carotid endarterectomy 02/2001  . CHF (congestive heart failure) ([place])    a. EF 40% in 2011. b. EF 35-40% by echo 03/12/12.  EF 3% 2019  . Hyperlipidemia   . Hypertension   . Hyponatremia    Noted on 02/2012 admission  . Pneumonia    02/2012  . Renal insufficiency    Noted on 02/2012 admission  .  Respiratory failure ([place])    02/2012 - requiring bipap - multifactorial in setting of PNA/CHF/NSTEMI  . Thyroid disease    Euthyroid 02/2012    Past Surgical History:  Procedure Laterality Date  . CARDIAC CATHETERIZATION    . CAROTID ENDARTERECTOMY     left 02/2001    Social History Social History   Socioeconomic History  . Marital status: Married    Spouse name: Not on file  . Number of children: Not on file  . Years of education: Not on file  . Highest education level: Not on file  Occupational History  . Occupation:  RETIRED    Employer: RETIRED    Comment: [employer]  . Financial resource strain: Not on file  . Food insecurity:    Worry: Not on file    Inability: Not on file  . Transportation needs:    Medical: Not on file    Non-medical: Not on file  Tobacco Use  . Smoking status: Never Smoker  . Smokeless tobacco: Never Used  Substance and Sexual Activity  . Alcohol use: No  . Drug use: No  . Sexual activity: Never  Lifestyle  . Physical activity:    Days per week: Not on file    Minutes per session: Not on file  . Stress: Not on file  Relationships  . Social connections:    Talks on phone: Not on file    Gets together: Not on file    Attends religious service: Not on file    Active member of club or organization: Not on file    Attends meetings of clubs or organizations: Not on file    Relationship status: Not on file  . Intimate partner violence:    Fear of current or ex partner: Not on file    Emotionally abused: Not on file    Physically abused: Not on file    Forced sexual activity: Not on file  Other Topics Concern  . Not on file  Social History Narrative   Lives at home with husband.       Allergies  Allergen Reactions  . Ativan [Lorazepam]     Very sensitive to Ativan.     Family History  Problem Relation Age of Onset  . Heart failure Mother 31  . Rheum arthritis Father        prostate  . Prostate cancer Unknown   . Coronary artery disease Brother 76       CABG, alive at 52  . Coronary artery disease Brother 74       Died  . Heart failure Sister   . Heart failure Sister       Prior to Admission medications   Medication Sig Start Date End Date Taking? Authorizing Provider  aspirin 81 MG tablet Take 81 mg by mouth daily.      [provider]  carvedilol (COREG) 3.125 MG tablet TAKE 1 TABLET BY MOUTH 2 TIMES A DAY WITH MEALS 11/22/17   Minus Breeding, MD  clopidogrel (PLAVIX) 75 MG tablet Take 1 tablet (75 mg total) by mouth  daily. 12/30/17   Eustaquio Maize, MD  furosemide (LASIX) 40 MG tablet Take 40 mg by mouth daily.    [provider]  isosorbide mononitrate (IMDUR) 60 MG 24 hr tablet Take 0.5 tablets (30 mg total) by mouth daily. 01/17/18   Johnson, Clanford L, MD  levothyroxine (SYNTHROID, LEVOTHROID) 50 MCG tablet Take 1 Tablet by mouth once  daily before breakfast 09/08/17   Eustaquio Maize, MD  nitroGLYCERIN (NITROSTAT) 0.4 MG SL tablet Place 1 tablet (0.4 mg total) under the tongue every 5 (five) minutes as needed for chest pain. 12/21/17   Minus Breeding, MD    Physical Exam:  Vitals:   02/07/18 0750 02/07/18 0800 02/07/18 0815 02/07/18 0830  BP: 129/70 (!) 106/59 (!) 105/55 (!) 103/54  Pulse: 94 90 91 87  Resp: (!) 23 (!) 24 (!) 22 (!) 21  Temp:      TempSrc:      SpO2: 100% 100% 100% 100%   Constitutional: NAD, calm, comfortable on BiPAP, frail appearing Eyes: PERRL, lids and conjunctivae normal ENMT: Mucous membranes are moist, without exudate or lesions  Neck: normal, supple, no masses, no thyromegaly Respiratory:  no wheezing, bibasilar anterior crackles, no rhonchi. Normal respiratory effort  Cardiovascular: Regular rate and rhythm, 2 out of 6 murmur, rubs or gallops.  Trace of bilateral lower extremity edema. 2+ pedal pulses. No carotid bruits.  Abdomen: Soft, non tender, No hepatosplenomegaly. Bowel sounds positive.  Musculoskeletal: no clubbing / cyanosis. Moves all extremities Skin: no jaundice, No lesions.  Neurologic: Sensation intact  Strength to be tested, as the patient is sound asleep at this time, did not disturb, arouses to voice and touch.    Labs on Admission: I have personally reviewed following labs and imaging studies  CBC: Recent Labs  Lab 02/07/18 0545  WBC 11.2*  NEUTROABS 6.9  HGB 12.4  HCT 41.7  MCV 85.8  PLT 628    Basic Metabolic Panel: Recent Labs  Lab 02/07/18 0545  NA 138  K 4.4  CL 101  CO2 25  GLUCOSE 142*  BUN 25*  CREATININE  1.21*  CALCIUM 8.5*    GFR: Estimated Creatinine Clearance: 21.9 mL/min (A) (by C-G formula based on SCr of 1.21 mg/dL (H)).  Liver Function Tests: Recent Labs  Lab 02/07/18 0545  AST 79*  ALT 50  ALKPHOS 119  BILITOT 1.2  PROT 5.8*  ALBUMIN 2.9*   No results for input(s): LIPASE, AMYLASE in the last 168 hours. No results for input(s): AMMONIA in the last 168 hours.  Coagulation Profile: No results for input(s): INR, PROTIME in the last 168 hours.  Cardiac Enzymes: No results for input(s): CKTOTAL, CKMB, CKMBINDEX, TROPONINI in the last 168 hours.  BNP (last 3 results) No results for input(s): PROBNP in the last 8760 hours.  HbA1C: No results for input(s): HGBA1C in the last 72 hours.  CBG: No results for input(s): GLUCAP in the last 168 hours.  Lipid Profile: No results for input(s): CHOL, HDL, LDLCALC, TRIG, CHOLHDL, LDLDIRECT in the last 72 hours.  Thyroid Function Tests: No results for input(s): TSH, T4TOTAL, FREET4, T3FREE, THYROIDAB in the last 72 hours.  Anemia Panel: No results for input(s): VITAMINB12, FOLATE, FERRITIN, TIBC, IRON, RETICCTPCT in the last 72 hours.  Urine analysis:    Component Value Date/Time   COLORURINE STRAW (A) 01/15/2018 2300   APPEARANCEUR CLEAR 01/15/2018 2300   LABSPEC 1.008 01/15/2018 2300   PHURINE 7.0 01/15/2018 2300   GLUCOSEU NEGATIVE 01/15/2018 2300   HGBUR NEGATIVE 01/15/2018 2300   BILIRUBINUR NEGATIVE 01/15/2018 2300   BILIRUBINUR negative 10/08/2015 1725   KETONESUR NEGATIVE 01/15/2018 2300   PROTEINUR NEGATIVE 01/15/2018 2300   UROBILINOGEN negative 10/08/2015 1725   UROBILINOGEN 0.2 05/25/2013 1525   NITRITE NEGATIVE 01/15/2018 2300   LEUKOCYTESUR TRACE (A) 01/15/2018 2300    Sepsis Labs: @LABRCNTIP (procalcitonin:4,lacticidven:4) )No results found for  this or any previous visit (from the past 240 hour(s)).   Radiological Exams on Admission: Dg Chest Port 1 View  Result Date: 02/07/2018 CLINICAL DATA:   Increased shortness of breath tonight.  Chest pain. EXAM: PORTABLE CHEST 1 VIEW COMPARISON:  01/17/2018 FINDINGS: Unchanged cardiomegaly. Unchanged mediastinal contours with aortic atherosclerosis. Bilateral pleural effusions have increased, small to moderate. Increased vascular congestion, mild pulmonary edema. No pneumothorax. IMPRESSION: Worsening CHF with increasing pleural effusions, vascular congestion and pulmonary edema. Electronically Signed   By: Jeb Levering M.D.   On: 02/07/2018 06:43    EKG: Independently reviewed. Sinus tachycardia Multiple premature complexes, vent & supraven Anterior infarct, old Repol abnrm suggests ischemia, lateral leads, worse than previously seen    Assessment/Plan Principal Problem:   Acute on chronic systolic CHF (congestive heart failure) (HCC) Active Problems:   CORONARY ATHEROSCLEROSIS NATIVE CORONARY ARTERY   Dyslipidemia   Ischemic cardiomyopathy   CKD (chronic kidney disease)   Spinal stenosis of lumbar region   Hypothyroidism   Essential hypertension    Acute on Chronic combined systolic and diastolic CHF/ICM   Troponin 0.04 EKG with mult PVC, with worsening lateral ischemia when compared with prior, Chest x-ray shows worsening CHF with increasing pleural effusions, vascular congestion and pulmonary edema. Last 2 D echo February 2019 shows EF 30 to 08%, grade 1 diastolic, moderately to severely reduced systolic.At the ED, the patient was given nitroglycerin, as well as Lasix 40 mg IV at 6:24 AM, with some improvement in her diuresis, the patient was placed on BiPAP, for comfort.  She seems to be more comfortable at the time of evaluation.  She is being admitted to stepdown for management of her symptoms.    ABGs bicarb 29, pH 7.4, PO2 104 Admit to SDU IP  CHF order set IV Lasix 40 mg bid  Strict I/Os Duoneb q 6 hrs prn. If symptoms worsen, then will change it to scheduled nebs Oxigen Hold Coreg  Continue ASA, ntg prn  Because the patient  is presenting with recurrent symptoms, case was discussed with her daughter, who agrees to allow her CODE STATUS to be changed to DNR, and to obtain palliative care evaluation for goals of care, hopefully to be able to have home health nursing to prevent further hospitalizations.  Hypertension   103/54   Pulse 87 , on the softer side of BP Hold  home anti-hypertensive medications as she will need Lasix for CHF exacerbation  Add Hydralazine Q6 hours as needed for BP >180   CAD, the patient will have her Coreg and Lasix on hold, in view of her blood pressure being on the lower side as mentioned above.  Continue Plavix and aspirin  Hyperlipidemia Continue home statins  Hypothyroidism: Continue home Synthroid  Chronic kidney disease stage 3, near her baseline, will need to continue to monitor, given ongoing diuresis Lab Results  Component Value Date   CREATININE 1.21 (H) 02/07/2018   CREATININE 1.25 (H) 01/26/2018   CREATININE 1.24 (H) 01/17/2018   Repeat BMET in am  Hold NSAIDS      DVT prophylaxis: Heparin subcu Code Status:    DNR Family Communication:  Discussed with patient's daughter Disposition Plan: Expect patient to be discharged to home after condition improves Consults called: Palliative care Admission status: Stepdown telemetry inpatient   Sharene Butters, PA-C [facility]   Amion text  [id number]   02/07/2018, 9:48 AM

## 2018-02-07 NOTE — Progress Notes (Signed)
[facility]  Patient Status: Active (receiving services up to time of hospitalization)  [facility] is providing the following services: RN  If patient discharges after hours, please call [phone].   Lori [name] 02/07/2018, 4:46 PM

## 2018-02-08 ENCOUNTER — Encounter (HOSPITAL_COMMUNITY): Payer: Self-pay | Admitting: *Deleted

## 2018-02-08 DIAGNOSIS — E039 Hypothyroidism, unspecified: Secondary | ICD-10-CM

## 2018-02-08 DIAGNOSIS — I5023 Acute on chronic systolic (congestive) heart failure: Secondary | ICD-10-CM

## 2018-02-08 DIAGNOSIS — I2510 Atherosclerotic heart disease of native coronary artery without angina pectoris: Secondary | ICD-10-CM

## 2018-02-08 DIAGNOSIS — Z7189 Other specified counseling: Secondary | ICD-10-CM

## 2018-02-08 DIAGNOSIS — F411 Generalized anxiety disorder: Secondary | ICD-10-CM

## 2018-02-08 DIAGNOSIS — Z515 Encounter for palliative care: Secondary | ICD-10-CM

## 2018-02-08 DIAGNOSIS — I255 Ischemic cardiomyopathy: Secondary | ICD-10-CM

## 2018-02-08 DIAGNOSIS — I10 Essential (primary) hypertension: Secondary | ICD-10-CM

## 2018-02-08 DIAGNOSIS — N189 Chronic kidney disease, unspecified: Secondary | ICD-10-CM

## 2018-02-08 DIAGNOSIS — E785 Hyperlipidemia, unspecified: Secondary | ICD-10-CM

## 2018-02-08 LAB — BASIC METABOLIC PANEL
Anion gap: 11 (ref 5–15)
BUN: 28 mg/dL — AB (ref 6–20)
CALCIUM: 8.6 mg/dL — AB (ref 8.9–10.3)
CO2: 31 mmol/L (ref 22–32)
CREATININE: 1.33 mg/dL — AB (ref 0.44–1.00)
Chloride: 102 mmol/L (ref 101–111)
GFR calc Af Amer: 38 mL/min — ABNORMAL LOW (ref >60)
GFR, EST NON AFRICAN AMERICAN: 32 mL/min — AB (ref >60)
Glucose, Bld: 98 mg/dL (ref 65–99)
Potassium: 3.6 mmol/L (ref 3.5–5.1)
Sodium: 144 mmol/L (ref 135–145)

## 2018-02-08 LAB — CBC
HCT: 38.8 % (ref 36.0–46.0)
Hemoglobin: 11.8 g/dL — ABNORMAL LOW (ref 12.0–15.0)
MCH: 25.8 pg — AB (ref 26.0–34.0)
MCHC: 30.4 g/dL (ref 30.0–36.0)
MCV: 84.9 fL (ref 78.0–100.0)
PLATELETS: 237 10*3/uL (ref 150–400)
RBC: 4.57 MIL/uL (ref 3.87–5.11)
RDW: 16.7 % — AB (ref 11.5–15.5)
WBC: 10.6 10*3/uL — AB (ref 4.0–10.5)

## 2018-02-08 MED ORDER — FLUTICASONE PROPIONATE 50 MCG/ACT NA SUSP
1.0000 | Freq: Every day | NASAL | Status: DC | PRN
  Administered 2018-02-10: 1 via NASAL
  Filled 2018-02-08: qty 16

## 2018-02-08 MED ORDER — FUROSEMIDE 10 MG/ML IJ SOLN
40.0000 mg | Freq: Every day | INTRAMUSCULAR | Status: DC
  Administered 2018-02-09 – 2018-02-10 (×2): 40 mg via INTRAVENOUS
  Filled 2018-02-08 (×3): qty 4

## 2018-02-08 MED ORDER — RESOURCE THICKENUP CLEAR PO POWD
ORAL | Status: DC | PRN
Start: 2018-02-08 — End: 2018-02-10
  Filled 2018-02-08 (×2): qty 125

## 2018-02-08 MED ORDER — BISACODYL 5 MG PO TBEC
10.0000 mg | DELAYED_RELEASE_TABLET | Freq: Once | ORAL | Status: AC
  Administered 2018-02-08: 10 mg via ORAL
  Filled 2018-02-08: qty 2

## 2018-02-08 MED ORDER — STARCH (THICKENING) PO POWD
ORAL | Status: DC | PRN
  Filled 2018-02-08: qty 227

## 2018-02-08 MED ORDER — POTASSIUM CHLORIDE CRYS ER 20 MEQ PO TBCR
40.0000 meq | EXTENDED_RELEASE_TABLET | Freq: Once | ORAL | Status: DC
  Filled 2018-02-08: qty 2

## 2018-02-08 MED ORDER — CARVEDILOL 3.125 MG PO TABS
3.1250 mg | ORAL_TABLET | Freq: Two times a day (BID) | ORAL | Status: DC
  Administered 2018-02-08 – 2018-02-10 (×4): 3.125 mg via ORAL
  Filled 2018-02-08 (×4): qty 1

## 2018-02-08 MED ORDER — PANTOPRAZOLE SODIUM 40 MG PO PACK
40.0000 mg | PACK | Freq: Every day | ORAL | Status: DC
  Administered 2018-02-09 – 2018-02-10 (×2): 40 mg via ORAL
  Filled 2018-02-08 (×2): qty 20

## 2018-02-08 MED ORDER — LORAZEPAM 0.5 MG PO TABS
0.2500 mg | ORAL_TABLET | Freq: Every day | ORAL | Status: DC
  Administered 2018-02-08 – 2018-02-09 (×2): 0.25 mg via ORAL
  Filled 2018-02-08 (×3): qty 1

## 2018-02-08 MED ORDER — MORPHINE SULFATE (CONCENTRATE) 10 MG/0.5ML PO SOLN
2.5000 mg | ORAL | Status: DC | PRN
  Administered 2018-02-09 – 2018-02-10 (×2): 2.6 mg via ORAL
  Filled 2018-02-08 (×2): qty 0.5

## 2018-02-08 MED ORDER — MAGNESIUM SULFATE 2 GM/50ML IV SOLN
2.0000 g | Freq: Once | INTRAVENOUS | Status: AC
  Administered 2018-02-08: 2 g via INTRAVENOUS
  Filled 2018-02-08: qty 50

## 2018-02-08 MED ORDER — LORAZEPAM 0.5 MG PO TABS
0.2500 mg | ORAL_TABLET | Freq: Two times a day (BID) | ORAL | Status: DC | PRN
  Administered 2018-02-08 – 2018-02-10 (×3): 0.25 mg via ORAL
  Filled 2018-02-08 (×4): qty 1

## 2018-02-08 NOTE — Care Management Note (Addendum)
Case Management Note  Patient Details  Name: Lori [name] MRN: [id number] Date of Birth: 05-01-1919  Subjective/Objective:         Pt admitted with SOB           Action/Plan:  PTA from home with daughter - daughter and son will provide 24 hour care to mom at discharge.  Pt already has home oxygen 2 liters continuous (supplied by [facility]), walker and bedside commode in the home.  Pt/daughter has decided pt  will discharge home with hospice.   Per daughter - pt will transport home via private vehicle.  Choice of agency given and family chose [facility] - tentative referral accepted by [place] with agency - agency will contact CM regarding final referral acceptance post review of faxed information (Demographic sheet and H&P faxed to [phone]).  CM informed agency that family are interested in hospital bed.    Expected Discharge Date:                  Expected Discharge Plan:  Home w Hospice Care  In-House Referral:     Discharge planning Services  CM Consult  Post Acute Care Choice:    Choice offered to:  Patient, Adult Children  DME Arranged:    DME Agency:     HH Arranged:    [place] Agency:  [facility]  Status of Service:  In process, will continue to follow  If discussed at Long Length of Stay Meetings, dates discussed:    Additional Comments: Update:  [facility] accepted pt.  Per agency - family would like to forgo hospital bed.   Maryclare Labrador, RN 02/08/2018, 11:47 AM

## 2018-02-08 NOTE — Progress Notes (Signed)
Called in to pts room for pt c/o difficulty breathing, upon entering room o2 sats 100% RR 28, encouraged pt to slow breathing, pts daughter requesting mask, this RN tried to explain that a mask was not needed with o2 sats of 100% but daughter insisted, NRB applied per family request and prn dose of ativan given. Will monitor.

## 2018-02-08 NOTE — Progress Notes (Signed)
Initial Nutrition Assessment  DOCUMENTATION CODES:   Non-severe (moderate) malnutrition in context of chronic illness, Underweight  INTERVENTION:   - Ensure Enlive po BID, each supplement provides 350 kcal and 20 grams of protein  - Magic cup TID with meals, each supplement provides 290 kcal and 9 grams of protein  - Encourage PO intake  NUTRITION DIAGNOSIS:   Moderate Malnutrition related to chronic illness, poor appetite(CHF, CKD stage III) as evidenced by moderate fat depletion, severe fat depletion, moderate muscle depletion, severe muscle depletion.  GOAL:   Patient will meet greater than or equal to 90% of their needs  MONITOR:   PO intake, Supplement acceptance, I & O's, Weight trends  REASON FOR ASSESSMENT:   Malnutrition Screening Tool    ASSESSMENT:   84 year old female who presented to ED with SOB and chest pressure. PMH significant for CHF, hypertension, hyperlipidemia, CKD stage III, and CAD with recent NSTEMI treated conservatively.  5/20 - chest x-ray shows worsening CHF with increasing pleural effusions, vascular congestion, and pulmonary edema  Palliative NP following with plans for meeting with pt and daughter today at 1000.  Spoke with pt and family members at bedside. Pt reports that her appetite is poor and she "doesn't want anything to eat." Pt also reports difficulty chewing and swallowing, stating that she coughs when she drinks water. Pt endorses eating 3 meals daily PTA but was unable to provide more specific information regarding typical PO intake.  Pt states that her UBW is 120 lbs. Pt unsure when she last weighed this. Per weight history in chart, pt has lost 9 lbs since February 2019. This corresponds to 7.1% weight loss in less than 4 months which is not significant for timeframe.  Pt agreeable to receiving Ensure Enlive BID and Magic Cup TID with meals.  Meal Completion: 50-100%  Medications reviewed and include: Ensure Enlive BID, 50 mcg  levothyroxine daily  Labs reviewed: BUN 28 (H), creatinine 1.33 (H), hemoglobin 11.8 (L)  UOP: 1000 ml x 24 hours  NUTRITION - FOCUSED PHYSICAL EXAM:    Most Recent Value  Orbital Region  Severe depletion  Upper Arm Region  Moderate depletion  Thoracic and Lumbar Region  Moderate depletion  Buccal Region  Moderate depletion  Temple Region  Moderate depletion  Clavicle Bone Region  Moderate depletion  Clavicle and Acromion Bone Region  Moderate depletion  Scapular Bone Region  Moderate depletion  Dorsal Hand  Moderate depletion  Patellar Region  Severe depletion  Anterior Thigh Region  Severe depletion  Posterior Calf Region  Moderate depletion  Edema (RD Assessment)  Mild  Hair  Reviewed  Eyes  Reviewed  Mouth  Reviewed  Skin  Reviewed  Nails  Reviewed       Diet Order:   Diet Order           Diet Heart Room service appropriate? Yes with Assist; Fluid consistency: Thin  Diet effective now          EDUCATION NEEDS:   No education needs have been identified at this time  Skin:  Skin Assessment: Reviewed RN Assessment  Last BM:  02/06/18 type 4  Height:   Ht Readings from Last 1 Encounters:  02/07/18 5\' 7"  (1.702 m)    Weight:   Wt Readings from Last 1 Encounters:  02/08/18 115 lb 9.6 oz (52.4 kg)    Ideal Body Weight:  61.4 kg  BMI:  Body mass index is 18.11 kg/m.  Estimated Nutritional Needs:   Kcal:  1250-1450 kcal/day  Protein:  50-65 grams/day  Fluid:  >/= 1.4 L/day    Gaynell Face, MS, RD, LDN Pager: [phone] Weekend/After Hours: [phone]

## 2018-02-08 NOTE — Progress Notes (Signed)
PROGRESS NOTE  Lori [name]  WSF:[id number] DOB: Jun 12, 1919 DOA: 02/07/2018 PCP: Eustaquio Maize, MD   Brief Narrative: Lori [name] is a 84 y.o. female with a history of chronic combined CHF, HTn, HLD, and CAD who was admitted 5/20 with recurrent CHF exacerbation (last admitted < 1 month ago. She is bereaving the loss of her husband of 66 years in late March. Initially on BiPAP, she has stabilized with diuresis to nasal cannula. Palliative care has met with patient and daughter with plan for discharge home with hospice once medically optimized.   Assessment & Plan: Principal Problem:   Acute on chronic systolic CHF (congestive heart failure) (HCC) Active Problems:   CORONARY ATHEROSCLEROSIS NATIVE CORONARY ARTERY   Dyslipidemia   Ischemic cardiomyopathy   CKD (chronic kidney disease)   Spinal stenosis of lumbar region   Hypothyroidism   Essential hypertension  Acute hypoxic respiratory failure:  - Weaned quickly from BiPAP and appears stable on nasal cannula. DNR. Transfer out of SDU today.  - Comfort measures in addition to treatment below will include anxiety management (low dose ativan qHS and prn), dyspnea management w/morphine suspension, and nebulizer therapies.  Acute on chronic combined CHF, CAD with ischemic cardiomyopathy: EF 30-35%, G1DD, BNP >2k, CXR consistent with pulmonary edema. - Redose lasix 60m IV daily, monitor I/O, daily weights, creatinine in AM - Restart low dose coreg. Has been normotensive. Would restart imdur (low dose) if BP maintains this or if pt develops anginal symptoms. - Not on ACE/ARB due to CKD presumably. - Continue ASA, plavix, statin - Maintain K >4 and Mg >2, having PVCs.   Stage III CKD: Creatinine baseline appears to be 1.2.  - Monitor with diuresis - Avoid nephrotoxins.   HTN:  - Holding meds to make room in BP for diuresis initially, though diuresis will be gentle and has been normotensive, stepwise restart coreg and possibly  imdur.   Hypothyroidism: TSH 2.520 < 6 months ago.  - Continue synthroid.   DVT prophylaxis: Heparin Code Status: DNR but while admitted does not want comfort measures only.  Family Communication: None at bedside this AM, palliative care discussed with daughter.  Disposition Plan: Home with hospice in next 1-2 days.   Consultants:   Palliative care  Procedures:   BiPAP 5/20  Antimicrobials:  None   Subjective: Breathing better than admission, still feeling winded just with talking longer sentences. No chest pain. Anxious and grieving her husband.   Objective: Vitals:   02/07/18 2355 02/08/18 0352 02/08/18 0500 02/08/18 0742  BP: 129/77 133/70  122/64  Pulse: 93 99  95  Resp: (!) 22 20  (!) 23  Temp: 98.2 F (36.8 C) 98.4 F (36.9 C)  97.9 F (36.6 C)  TempSrc: Oral Oral  Oral  SpO2: 100%   100%  Weight:   52.4 kg (115 lb 9.6 oz)   Height:        Intake/Output Summary (Last 24 hours) at 02/08/2018 1211 Last data filed at 02/08/2018 0955 Gross per 24 hour  Intake 340 ml  Output 1000 ml  Net -660 ml   Filed Weights   02/07/18 1620 02/08/18 0500  Weight: 52.2 kg (115 lb 1.3 oz) 52.4 kg (115 lb 9.6 oz)    Gen: Elderly female in no distress Pulm: Non-labored tachypnea, 100% with nasal cannula. Bibasilar crackles without wheezes. CV: Regular rate and rhythm with occasional PVCs. II/VI systolic murmur, no rub, or gallop. No JVD, 1+ pedal edema. GI: Abdomen soft, non-tender,  non-distended, with normoactive bowel sounds. No organomegaly or masses felt. Ext: Warm, no deformities Skin: No rashes, lesions or ulcers Neuro: Alert and oriented. No focal neurological deficits. Psych: Judgement and insight appear normal. Mood & affect appropriate.   Data Reviewed: I have personally reviewed following labs and imaging studies  CBC: Recent Labs  Lab 02/07/18 0545 02/08/18 0651  WBC 11.2* 10.6*  NEUTROABS 6.9  --   HGB 12.4 11.8*  HCT 41.7 38.8  MCV 85.8 84.9  PLT  266 889   Basic Metabolic Panel: Recent Labs  Lab 02/07/18 0545 02/08/18 0651  NA 138 144  K 4.4 3.6  CL 101 102  CO2 25 31  GLUCOSE 142* 98  BUN 25* 28*  CREATININE 1.21* 1.33*  CALCIUM 8.5* 8.6*   GFR: Estimated Creatinine Clearance: 20 mL/min (A) (by C-G formula based on SCr of 1.33 mg/dL (H)). Liver Function Tests: Recent Labs  Lab 02/07/18 0545  AST 79*  ALT 50  ALKPHOS 119  BILITOT 1.2  PROT 5.8*  ALBUMIN 2.9*   No results for input(s): LIPASE, AMYLASE in the last 168 hours. No results for input(s): AMMONIA in the last 168 hours. Coagulation Profile: No results for input(s): INR, PROTIME in the last 168 hours. Cardiac Enzymes: No results for input(s): CKTOTAL, CKMB, CKMBINDEX, TROPONINI in the last 168 hours. BNP (last 3 results) No results for input(s): PROBNP in the last 8760 hours. HbA1C: No results for input(s): HGBA1C in the last 72 hours. CBG: No results for input(s): GLUCAP in the last 168 hours. Lipid Profile: No results for input(s): CHOL, HDL, LDLCALC, TRIG, CHOLHDL, LDLDIRECT in the last 72 hours. Thyroid Function Tests: No results for input(s): TSH, T4TOTAL, FREET4, T3FREE, THYROIDAB in the last 72 hours. Anemia Panel: No results for input(s): VITAMINB12, FOLATE, FERRITIN, TIBC, IRON, RETICCTPCT in the last 72 hours. Urine analysis:    Component Value Date/Time   COLORURINE YELLOW 02/07/2018 1130   APPEARANCEUR CLEAR 02/07/2018 1130   LABSPEC 1.006 02/07/2018 1130   PHURINE 6.0 02/07/2018 1130   GLUCOSEU NEGATIVE 02/07/2018 1130   HGBUR NEGATIVE 02/07/2018 [place] 02/07/2018 1130   BILIRUBINUR negative 10/08/2015 1725   KETONESUR NEGATIVE 02/07/2018 1130   PROTEINUR NEGATIVE 02/07/2018 1130   UROBILINOGEN negative 10/08/2015 1725   UROBILINOGEN 0.2 05/25/2013 1525   NITRITE NEGATIVE 02/07/2018 1130   LEUKOCYTESUR NEGATIVE 02/07/2018 1130   Recent Results (from the past 240 hour(s))  MRSA PCR Screening     Status:  None   Collection Time: 02/07/18  4:39 PM  Result Value Ref Range Status   MRSA by PCR NEGATIVE NEGATIVE Final    Comment:        The GeneXpert MRSA Assay (FDA approved for NASAL specimens only), is one component of a comprehensive MRSA colonization surveillance program. It is not intended to diagnose MRSA infection nor to guide or monitor treatment for MRSA infections. Performed at [facility], [place] [address], [place], [place] [address]       Radiology Studies: Dg Chest Port 1 View  Result Date: 02/07/2018 CLINICAL DATA:  Increased shortness of breath tonight.  Chest pain. EXAM: PORTABLE CHEST 1 VIEW COMPARISON:  01/17/2018 FINDINGS: Unchanged cardiomegaly. Unchanged mediastinal contours with aortic atherosclerosis. Bilateral pleural effusions have increased, small to moderate. Increased vascular congestion, mild pulmonary edema. No pneumothorax. IMPRESSION: Worsening CHF with increasing pleural effusions, vascular congestion and pulmonary edema. Electronically Signed   By: Jeb Levering M.D.   On: 02/07/2018 06:43  Scheduled Meds: . aspirin  81 mg Oral Daily  . clopidogrel  75 mg Oral Daily  . feeding supplement (ENSURE ENLIVE)  237 mL Oral BID BM  . furosemide  40 mg Intravenous Daily  . heparin  5,000 Units Subcutaneous Q8H  . isosorbide mononitrate  30 mg Oral Daily  . levothyroxine  50 mcg Oral QAC breakfast  . LORazepam  0.25 mg Oral QHS   Continuous Infusions:   LOS: 1 day   Time spent: 25 minutes.  Patrecia Pour, MD [facility] Pager [phone]  If 7PM-7AM, please contact night-coverage [email or web address] Password [facility] 02/08/2018, 12:11 PM

## 2018-02-08 NOTE — Consult Note (Signed)
Consultation Note Date: 02/08/2018   Patient Name: Lori [name]  DOB: [birth year]  MRN: [id number]  Age / Sex: 84 y.o., female  PCP: Eustaquio Maize, MD Referring Physician: Patrecia Pour, MD  Reason for Consultation: Establishing goals of care  HPI/Patient Profile: 84 y.o. female  with past medical history of diastolic and systolic CHF EF 28-00%, CAD, NSTEMI, HTN, HLD, CKD3 admitted on 02/07/2018 from home with SOB and chest pressure. She has had 3 admissions in 6 months related to CAD and CHF. Required BiPAP for comfort in ED. Initially refused transport to ED with EMS but then requested EMS to be called again and came to ED. Admitted with plan for diuresis.   Clinical Assessment and Goals of Care: I met today with Lori [name] and daughter, Lori [name], at bedside as scheduled. Lori [name] is awake and alert although hard of hearing. She does become SOB during conversation and was fatigued after conversation. I do believe that anxiety is playing a large role in her SOB as well (this has developed since her husband of 26 yrs died in December 18, 2022 per [place]).   We discussed GOC and Lori [name] has good understanding that she is nearing the end of her life. She tells me that she would like to "stay here" indefinitely in the hospital because she feels safe and cared for but she knows this is not an option. Lori [name] shares that she and her family are present all but maybe 2-4 hours during the day with Lori [name] at her home. We further talked more about going home with hospice Lori [name] very much favors this idea) and what this would look like and the benefits of hospice at home. Lori [name] says she will do whatever Lori [name] believes is best. She becomes tearful speaking of EOL and leaving Lori [name] but says that she is not scared and knows that "none of [place] are here forever." She does comment that when her husband died she said she was  going to go with him.   I spoke more with Lori [name] as Lori [name] became tired regarding symptom management of anxiety and SOB including use of ativan and sublingual morphine. Lori [name] expresses understanding and shares that her father died at [facility] so she is somewhat familiar with hospice and comfort care. Scheduled ativan for sleep as discussed with Lori [name] as [name] complains of insomnia for some time now. All questions/concerns addressed. Encouraged them to call me with any further questions/concerns and for now plans are to return home with hospice support at time of discharge. Will need hospital bed at home.    Primary Decision Maker PATIENT along with daughter, Lori [name] with hospice - Ativan prn for anxiety and scheduled for nighttime - Roxanol prn for SOB unrelieved by Lasix and other interventions  Code Status/Advance Care Planning:  DNR   Symptom Management:   Anxiety: Ativan 0.25 mg BID prn.  Insomnia: Ativan 0.25 mg qhs.   SOB: Roxanol 2.5 mg  every 4 hours prn if no relief from other interventions/medications.   Palliative Prophylaxis:   Bowel Regimen, Delirium Protocol, Frequent Pain Assessment and Turn Reposition  Additional Recommendations (Limitations, Scope, Preferences):  Avoid Hospitalization  Psycho-social/Spiritual:   Desire for further Chaplaincy support:yes  Additional Recommendations: Caregiving  Support/Resources, Education on Hospice and Grief/Bereavement Support  Prognosis:   < 6 months  Discharge Planning: Home with Hospice      Primary Diagnoses: Present on Admission: . CORONARY ATHEROSCLEROSIS NATIVE CORONARY ARTERY . Dyslipidemia . Ischemic cardiomyopathy . CKD (chronic kidney disease) . Spinal stenosis of lumbar region . Hypothyroidism . Essential hypertension . Acute on chronic systolic CHF (congestive heart failure) ([place])   I have reviewed the medical record,  interviewed the patient and family, and examined the patient. The following aspects are pertinent.  Past Medical History:  Diagnosis Date  . Arthritis   . CAD (coronary artery disease)    a. NSTEMI in 2011 s/p PCI/DES to distal Cx, s/p PCI/DES to OM2 (EF 40%). b. NSTEMI 02/2012 treated conservatively. c. NSEMI 10/2017 managed medically.   . Carotid artery stenosis    Left carotid endarterectomy 02/2001  . CHF (congestive heart failure) ([place])    a. EF 40% in 2011. b. EF 35-40% by echo 03/12/12.  EF 3% 2019  . Hyperlipidemia   . Hypertension   . Hyponatremia    Noted on 02/2012 admission  . Pneumonia    02/2012  . Renal insufficiency    Noted on 02/2012 admission  . Respiratory failure ([place])    02/2012 - requiring bipap - multifactorial in setting of PNA/CHF/NSTEMI  . Thyroid disease    Euthyroid 02/2012   Social History   Socioeconomic History  . Marital status: Married    Spouse name: Not on file  . Number of children: Not on file  . Years of education: Not on file  . Highest education level: Not on file  Occupational History  . Occupation: RETIRED    Employer: RETIRED    Comment: [employer]  . Financial resource strain: Not on file  . Food insecurity:    Worry: Not on file    Inability: Not on file  . Transportation needs:    Medical: Not on file    Non-medical: Not on file  Tobacco Use  . Smoking status: Never Smoker  . Smokeless tobacco: Never Used  Substance and Sexual Activity  . Alcohol use: No  . Drug use: No  . Sexual activity: Never  Lifestyle  . Physical activity:    Days per week: Not on file    Minutes per session: Not on file  . Stress: Not on file  Relationships  . Social connections:    Talks on phone: Not on file    Gets together: Not on file    Attends religious service: Not on file    Active member of club or organization: Not on file    Attends meetings of clubs or organizations: Not on file    Relationship status: Not on  file  Other Topics Concern  . Not on file  Social History Narrative   Lives at home with husband.     Family History  Problem Relation Age of Onset  . Heart failure Mother 31  . Rheum arthritis Father        prostate  . Prostate cancer Unknown   . Coronary artery disease Brother 61       CABG, alive at 65  .  Coronary artery disease Brother 90       Died  . Heart failure Sister   . Heart failure Sister    Scheduled Meds: . aspirin  81 mg Oral Daily  . clopidogrel  75 mg Oral Daily  . feeding supplement (ENSURE ENLIVE)  237 mL Oral BID BM  . heparin  5,000 Units Subcutaneous Q8H  . isosorbide mononitrate  30 mg Oral Daily  . levothyroxine  50 mcg Oral QAC breakfast   Continuous Infusions: PRN Meds:.acetaminophen, hydrALAZINE, ipratropium-albuterol, nitroGLYCERIN, ondansetron (ZOFRAN) IV Allergies  Allergen Reactions  . Ativan [Lorazepam] Other (See Comments)    Large doses of Ativan - excessive drowsiness   Review of Systems  Constitutional: Positive for activity change, appetite change and fatigue.  Respiratory: Positive for shortness of breath.   Neurological: Positive for weakness.  Psychiatric/Behavioral: Positive for sleep disturbance. The patient is nervous/anxious.     Physical Exam  Constitutional: She is oriented to person, place, and time. She appears well-developed. She has a sickly appearance.  Thin, elderly, frail  Cardiovascular: Normal rate and regular rhythm.  Pulmonary/Chest: No accessory muscle usage. No tachypnea. She is in respiratory distress.  Moderate SOB with conversation at rest  Abdominal: Normal appearance.  Neurological: She is alert and oriented to person, place, and time.  Nursing note and vitals reviewed.   Vital Signs: BP 122/64   Pulse 95   Temp 97.9 F (36.6 C) (Oral)   Resp (!) 23   Ht _0  (1.702 m)   Wt 52.4 kg (115 lb 9.6 oz)   SpO2 100%   BMI 18.11 kg/m  Pain Scale: 0-10   Pain Score: 0-No pain   SpO2: SpO2: 100  % O2 Device:SpO2: 100 % O2 Flow Rate: .O2 Flow Rate (L/min): 2 L/min  IO: Intake/output summary:   Intake/Output Summary (Last 24 hours) at 02/08/2018 0937 Last data filed at 02/08/2018 0748 Gross per 24 hour  Intake 100 ml  Output 1350 ml  Net -1250 ml    LBM: Last BM Date: 02/06/18 Baseline Weight: Weight: 52.2 kg (115 lb 1.3 oz) Most recent weight: Weight: 52.4 kg (115 lb 9.6 oz)     Palliative Assessment/Data: 30%     Time Total: 50 min  Greater than 50%  of this time was spent counseling and coordinating care related to the above assessment and plan.  Signed by: Vinie Sill, NP Palliative Medicine Team Pager # [phone] (M-F 8a-5p) Team Phone # [phone] (Nights/Weekends)

## 2018-02-09 DIAGNOSIS — J9620 Acute and chronic respiratory failure, unspecified whether with hypoxia or hypercapnia: Secondary | ICD-10-CM

## 2018-02-09 DIAGNOSIS — E440 Moderate protein-calorie malnutrition: Secondary | ICD-10-CM

## 2018-02-09 LAB — BASIC METABOLIC PANEL
Anion gap: 9 (ref 5–15)
BUN: 28 mg/dL — ABNORMAL HIGH (ref 6–20)
CALCIUM: 8.5 mg/dL — AB (ref 8.9–10.3)
CO2: 31 mmol/L (ref 22–32)
CREATININE: 0.99 mg/dL (ref 0.44–1.00)
Chloride: 100 mmol/L — ABNORMAL LOW (ref 101–111)
GFR, EST AFRICAN AMERICAN: 54 mL/min — AB (ref >60)
GFR, EST NON AFRICAN AMERICAN: 46 mL/min — AB (ref >60)
Glucose, Bld: 107 mg/dL — ABNORMAL HIGH (ref 65–99)
Potassium: 3.8 mmol/L (ref 3.5–5.1)
SODIUM: 140 mmol/L (ref 135–145)

## 2018-02-09 LAB — MAGNESIUM: MAGNESIUM: 2.5 mg/dL — AB (ref 1.7–2.4)

## 2018-02-09 NOTE — Progress Notes (Signed)
Palliative:  I met today with Ms. Lori [name]. She has breakfast in front of her but she is coughing and extremely anxious. She is struggling with thin liquids and says she often gets strangled on them. RN is getting thick it for her for comfort. Asked RN to provide Ativan for her anxiety. Ms. Lori [name] does say that she slept well last night so will continue Ativan. May need scheduled during daytime as well.   Late entry: I received call from daughter, Lori [name], who shares that her mother is having second thoughts about hospice. Lori [name] is unsure what to do. Lori [name] says her mother told her that she would like to return to the hospice and "I am not dying." I do believe she is a good candidate for hospice even with desire to return to the hospital (which she doesn't have many options even at the hospital). I believe with hospice she would have the support to prevent her from wanting to come back to the hospital. We agreed that we would all meet again tomorrow to further discuss her wishes and options.   25 min  Vinie Sill, NP Palliative Medicine Team Pager # [phone] (M-F 8a-5p) Team Phone # [phone] (Nights/Weekends)

## 2018-02-09 NOTE — Progress Notes (Signed)
PROGRESS NOTE    ZENIAH [name]  XLK:[id number] DOB: [birth year] DOA: 02/07/2018 PCP: Eustaquio Maize, MD    Brief Narrative: Lori [name] is a 84 y.o. female with a history of chronic combined CHF, HTn, HLD, and CAD who was admitted 5/20 with recurrent CHF exacerbation (last admitted < 1 month ago. She is bereaving the loss of her husband of 46 years in late March. Initially on BiPAP, she has stabilized with diuresis to nasal cannula. Palliative care has met with patient and daughter with plan for discharge home with hospice once medically optimized.    Assessment & Plan:   Principal Problem:   Acute on chronic systolic CHF (congestive heart failure) (HCC) Active Problems:   CORONARY ATHEROSCLEROSIS NATIVE CORONARY ARTERY   Dyslipidemia   Ischemic cardiomyopathy   CKD (chronic kidney disease)   Spinal stenosis of lumbar region   Hypothyroidism   Essential hypertension   Anxiety state   Malnutrition of moderate degree   Acute respiratory failure with hypoxia secondary to acute on chronic combined CHF Her last EF was 30%.  Her CXR was consistent with pulm edema, She initially required BIPAP, was able to wean her off it.   she is currently being diuresed with IV lasix,.  [place] oxygen as needed to keep sats in 90%.  Watch creatinine and electrolytes while on IASIX.   Comfort measures in addition to treatment below will include anxiety management (low dose ativan qHS and prn), dyspnea management w/morphine suspension, and nebulizer therapies.    Stage 3 ckd: Creatinine at 0.99 today.  Monitor while on lasix.    Hypertension; Well controlled.    Hypothyroidism: Resume synthroid.   Moderate malnutrition;  Nutrition consulted.   ishemic cardiomyopathy:  Currently chest pain free.    DVT prophylaxis:heparin sq Code Status: DNR Family Communication: DAUGHTER AT BEDSIDE.  Disposition Plan: possibly home with home hospice.    Consultants:   Palliative care.       Procedures: none.   Antimicrobials: none.   Subjective: Tachypneic, but not in distress. Denies having chest pain or sob. Very anxious.    Objective: Vitals:   02/09/18 0338 02/09/18 0500 02/09/18 0800 02/09/18 1255  BP: 137/64  132/70 123/60  Pulse: 94  94 94  Resp: (!) 25  (!) 22 (!) 32  Temp: 97.9 F (36.6 C)  (!) 97.4 F (36.3 C) 97.9 F (36.6 C)  TempSrc: Axillary  Axillary Oral  SpO2: 100%  100% 100%  Weight:  54.7 kg (120 lb 9.5 oz)    Height:        Intake/Output Summary (Last 24 hours) at 02/09/2018 1302 Last data filed at 02/08/2018 1743 Gross per 24 hour  Intake 240 ml  Output 0 ml  Net 240 ml   Filed Weights   02/07/18 1620 02/08/18 0500 02/09/18 0500  Weight: 52.2 kg (115 lb 1.3 oz) 52.4 kg (115 lb 9.6 oz) 54.7 kg (120 lb 9.5 oz)    Examination:  General exam: Appears calm and comfortable  Respiratory system: Clear to auscultation. Respiratory effort normal. Cardiovascular system: S1 & S2 heard, RRR. No JVD, . No pedal edema. Gastrointestinal system: Abdomen is nondistended, soft and nontender. No organomegaly or masses felt. Normal bowel sounds heard. Central nervous system: Alert and oriented. No focal neurological deficits. Extremities: Symmetric 5 x 5 power. Skin: No rashes, lesions or ulcers Psychiatry:Mood & affect appropriate.     Data Reviewed: I have personally reviewed following labs and imaging studies  CBC: Recent  Labs  Lab 02/07/18 0545 02/08/18 0651  WBC 11.2* 10.6*  NEUTROABS 6.9  --   HGB 12.4 11.8*  HCT 41.7 38.8  MCV 85.8 84.9  PLT 266 937   Basic Metabolic Panel: Recent Labs  Lab 02/07/18 0545 02/08/18 0651 02/09/18 0224  NA 138 144 140  K 4.4 3.6 3.8  CL 101 102 100*  CO2 '25 31 31  '$ GLUCOSE 142* 98 107*  BUN 25* 28* 28*  CREATININE 1.21* 1.33* 0.99  CALCIUM 8.5* 8.6* 8.5*  MG  --   --  2.5*   GFR: Estimated Creatinine Clearance: 28 mL/min (by C-G formula based on SCr of 0.99 mg/dL). Liver Function  Tests: Recent Labs  Lab 02/07/18 0545  AST 79*  ALT 50  ALKPHOS 119  BILITOT 1.2  PROT 5.8*  ALBUMIN 2.9*   No results for input(s): LIPASE, AMYLASE in the last 168 hours. No results for input(s): AMMONIA in the last 168 hours. Coagulation Profile: No results for input(s): INR, PROTIME in the last 168 hours. Cardiac Enzymes: No results for input(s): CKTOTAL, CKMB, CKMBINDEX, TROPONINI in the last 168 hours. BNP (last 3 results) No results for input(s): PROBNP in the last 8760 hours. HbA1C: No results for input(s): HGBA1C in the last 72 hours. CBG: No results for input(s): GLUCAP in the last 168 hours. Lipid Profile: No results for input(s): CHOL, HDL, LDLCALC, TRIG, CHOLHDL, LDLDIRECT in the last 72 hours. Thyroid Function Tests: No results for input(s): TSH, T4TOTAL, FREET4, T3FREE, THYROIDAB in the last 72 hours. Anemia Panel: No results for input(s): VITAMINB12, FOLATE, FERRITIN, TIBC, IRON, RETICCTPCT in the last 72 hours. Sepsis Labs: No results for input(s): PROCALCITON, LATICACIDVEN in the last 168 hours.  Recent Results (from the past 240 hour(s))  MRSA PCR Screening     Status: None   Collection Time: 02/07/18  4:39 PM  Result Value Ref Range Status   MRSA by PCR NEGATIVE NEGATIVE Final    Comment:        The GeneXpert MRSA Assay (FDA approved for NASAL specimens only), is one component of a comprehensive MRSA colonization surveillance program. It is not intended to diagnose MRSA infection nor to guide or monitor treatment for MRSA infections. Performed at [facility], [place] [address] [place], [place] [address]          Radiology Studies: No results found.      Scheduled Meds: . aspirin  81 mg Oral Daily  . carvedilol  3.125 mg Oral BID WC  . clopidogrel  75 mg Oral Daily  . feeding supplement (ENSURE ENLIVE)  237 mL Oral BID BM  . furosemide  40 mg Intravenous Daily  . heparin  5,000 Units Subcutaneous Q8H  . isosorbide mononitrate   30 mg Oral Daily  . levothyroxine  50 mcg Oral QAC breakfast  . LORazepam  0.25 mg Oral QHS  . pantoprazole sodium  40 mg Oral Daily  . potassium chloride  40 mEq Oral Once   Continuous Infusions:   LOS: 2 days    Time spent: 35 min    Hosie Poisson, MD [facility] Pager [phone] If 7PM-7AM, please contact night-coverage [email or web address] Password [email or web address] 02/09/2018, 1:02 PM

## 2018-02-09 NOTE — Progress Notes (Signed)
Pharmacist Heart Failure Core Measure Documentation  Assessment: Lori [name] has an EF documented as 30% on echo.  Rationale: Heart failure patients with left ventricular systolic dysfunction (LVSD) and an EF < 40% should be prescribed an angiotensin converting enzyme inhibitor (ACEI) or angiotensin receptor blocker (ARB) at discharge unless a contraindication is documented in the medical record.  This patient is not currently on an ACEI or ARB for HF.  This note is being placed in the record in order to provide documentation that a contraindication to the use of these agents is present for this encounter.  ACE Inhibitor or Angiotensin Receptor Blocker is contraindicated (specify all that apply)  []   ACEI allergy AND ARB allergy []   Angioedema []   Moderate or severe aortic stenosis []   Hyperkalemia []   Hypotension []   Renal artery stenosis [x]   Worsening renal function, preexisting renal disease or dysfunction   Georgina Peer 02/09/2018 8:56 AM

## 2018-02-10 ENCOUNTER — Inpatient Hospital Stay (HOSPITAL_COMMUNITY): Payer: Medicare Other

## 2018-02-10 DIAGNOSIS — I509 Heart failure, unspecified: Secondary | ICD-10-CM

## 2018-02-10 MED ORDER — RESOURCE THICKENUP CLEAR PO POWD: ORAL | Status: AC

## 2018-02-10 MED ORDER — LORAZEPAM 0.5 MG PO TABS: 0.2500 mg | ORAL_TABLET | Freq: Every day | ORAL | 0 refills | Status: AC

## 2018-02-10 MED ORDER — MORPHINE SULFATE (CONCENTRATE) 10 MG/0.5ML PO SOLN: 2.5000 mg | ORAL | 0 refills | Status: AC | PRN

## 2018-02-10 MED ORDER — IPRATROPIUM-ALBUTEROL 0.5-2.5 (3) MG/3ML IN SOLN: 3.0000 mL | Freq: Four times a day (QID) | RESPIRATORY_TRACT | 3 refills | Status: AC | PRN

## 2018-02-10 MED ORDER — ENSURE ENLIVE PO LIQD: 237.0000 mL | Freq: Two times a day (BID) | ORAL | 12 refills | Status: AC

## 2018-02-10 MED ORDER — LORAZEPAM 0.5 MG PO TABS: 0.2500 mg | ORAL_TABLET | Freq: Two times a day (BID) | ORAL | 0 refills | Status: AC | PRN

## 2018-02-10 NOTE — Discharge Instructions (Signed)
Heart Failure °Heart failure means your heart has trouble pumping blood. This makes it hard for your body to work well. Heart failure is usually a long-term (chronic) condition. You must take good care of yourself and follow your doctor's treatment plan. °Follow these instructions at home: °· Take your heart medicine as told by your doctor. °? Do not stop taking medicine unless your doctor tells you to. °? Do not skip any dose of medicine. °? Refill your medicines before they run out. °? Take other medicines only as told by your doctor or pharmacist. °· Stay active if told by your doctor. The elderly and people with severe heart failure should talk with a doctor about physical activity. °· Eat heart-healthy foods. Choose foods that are without trans fat and are low in saturated fat, cholesterol, and salt (sodium). This includes fresh or frozen fruits and vegetables, fish, lean meats, fat-free or low-fat dairy foods, whole grains, and high-fiber foods. Lentils and dried peas and beans (legumes) are also good choices. °· Limit salt if told by your doctor. °· Cook in a healthy way. Roast, grill, broil, bake, poach, steam, or stir-fry foods. °· Limit fluids as told by your doctor. °· Weigh yourself every morning. Do this after you pee (urinate) and before you eat breakfast. Write down your weight to give to your doctor. °· Take your blood pressure and write it down if your doctor tells you to. °· Ask your doctor how to check your pulse. Check your pulse as told. °· Lose weight if told by your doctor. °· Stop smoking or chewing tobacco. Do not use gum or patches that help you quit without your doctor's approval. °· Schedule and go to doctor visits as told. °· Nonpregnant women should have no more than 1 drink a day. Men should have no more than 2 drinks a day. Talk to your doctor about drinking alcohol. °· Stop illegal drug use. °· Stay current with shots (immunizations). °· Manage your health conditions as told by your  doctor. °· Learn to manage your stress. °· Rest when you are tired. °· If it is really hot outside: °? Avoid intense activities. °? Use air conditioning or fans, or get in a cooler place. °? Avoid caffeine and alcohol. °? Wear loose-fitting, lightweight, and light-colored clothing. °· If it is really cold outside: °? Avoid intense activities. °? Layer your clothing. °? Wear mittens or gloves, a hat, and a scarf when going outside. °? Avoid alcohol. °· Learn about heart failure and get support as needed. °· Get help to maintain or improve your quality of life and your ability to care for yourself as needed. °Contact a doctor if: °· You gain weight quickly. °· You are more short of breath than usual. °· You cannot do your normal activities. °· You tire easily. °· You cough more than normal, especially with activity. °· You have any or more puffiness (swelling) in areas such as your hands, feet, ankles, or belly (abdomen). °· You cannot sleep because it is hard to breathe. °· You feel like your heart is beating fast (palpitations). °· You get dizzy or light-headed when you stand up. °Get help right away if: °· You have trouble breathing. °· There is a change in mental status, such as becoming less alert or not being able to focus. °· You have chest pain or discomfort. °· You faint. °This information is not intended to replace advice given to you by your health care provider. Make sure you   discuss any questions you have with your health care provider. °Document Released: 06/16/2008 Document Revised: 02/13/2016 Document Reviewed: 10/24/2012 °Elsevier Interactive Patient Education © 2017 Elsevier Inc. ° °

## 2018-02-10 NOTE — Care Management Important Message (Signed)
Important Message  Patient Details  Name: Lori [name] MRN: [id number] Date of Birth: 1919-03-27   Medicare Important Message Given:  Yes    Zi Newbury P Obert Espindola 02/10/2018, 1:52 PM

## 2018-02-10 NOTE — Discharge Summary (Signed)
Physician Discharge Summary  Lori [name] LTJ:[id number] DOB: 09-06-1919 DOA: 02/07/2018  PCP: Eustaquio Maize, MD  Admit date: 02/07/2018 Discharge date: 02/10/2018  Admitted From: Home.  Disposition:  Home with hospice.   Recommendations for Outpatient Follow-up:  1. Follow up with PCP in 1-2 weeks 2. Please follow up with Hospice MD as recommended.     Discharge Condition: hospice.  CODE STATUS:DNR Diet recommendation: Heart Healthy  Brief/Interim Summary: Lori [name] a63 y.o.femalewith a history of chronic combined CHF, HTn, HLD, and CAD who was admitted 5/20 with recurrent CHF exacerbation (last admitted < 1 month ago. She is bereaving the loss of her husband of 22 years in late March. Initially on BiPAP, she has stabilized with diuresis to nasal cannula. Palliative care has met with patient and daughter with plan for discharge home with hospice once medically optimized.    Discharge Diagnoses:  Principal Problem:   Acute on chronic systolic CHF (congestive heart failure) (HCC) Active Problems:   CORONARY ATHEROSCLEROSIS NATIVE CORONARY ARTERY   Dyslipidemia   Ischemic cardiomyopathy   CKD (chronic kidney disease)   Spinal stenosis of lumbar region   Hypothyroidism   Essential hypertension   Anxiety state   Malnutrition of moderate degree   Acute respiratory failure with hypoxia secondary to acute on chronic combined CHF Her last EF was 30%.  Her CXR was consistent with pulm edema, She initially required BIPAP, was able to wean her off it.   she is currently being diuresed with IV lasix, transition ed to oral lasix on discharge.  [place] oxygen as needed to keep sats in 90%.    Comfort measures in addition to treatment below will include anxiety management (low dose ativan qHS and prn), dyspnea management w/morphine suspension, and nebulizer therapies.    Stage 3 ckd: Creatinine at 0.99 today.  Monitor while on lasix.    Hypertension; Well  controlled.    Hypothyroidism: Resume synthroid.   Moderate malnutrition;  Nutrition consulted.   ishemic cardiomyopathy:  Currently chest pain free.     Discharge Instructions  Discharge Instructions    Diet - low sodium heart healthy   Complete by:  As directed    Discharge instructions   Complete by:  As directed    Please follow up with hospice MD as needed.     Allergies as of 02/10/2018      Reactions   Ativan [lorazepam] Other (See Comments)   Large doses of Ativan - excessive drowsiness      Medication List    TAKE these medications   aspirin 81 MG tablet Take 81 mg by mouth daily.   carvedilol 3.125 MG tablet Commonly known as:  COREG TAKE 1 TABLET BY MOUTH 2 TIMES A DAY WITH MEALS   clopidogrel 75 MG tablet Commonly known as:  PLAVIX Take 1 tablet (75 mg total) by mouth daily. What changed:  when to take this   esomeprazole 40 MG capsule Commonly known as:  NEXIUM Take 40 mg by mouth daily as needed (allergies).   feeding supplement (ENSURE ENLIVE) Liqd Take 237 mLs by mouth 2 (two) times daily between meals.   fluticasone 50 MCG/ACT nasal spray Commonly known as:  FLONASE Place 1 spray into both nostrils daily as needed for allergies or rhinitis.   furosemide 40 MG tablet Commonly known as:  LASIX Take 40 mg by mouth daily.   ipratropium-albuterol 0.5-2.5 (3) MG/3ML Soln Commonly known as:  DUONEB Take 3 mLs by nebulization every 6 (  six) hours as needed.   isosorbide mononitrate 60 MG 24 hr tablet Commonly known as:  IMDUR Take 0.5 tablets (30 mg total) by mouth daily.   levothyroxine 50 MCG tablet Commonly known as:  SYNTHROID, LEVOTHROID Take 1 Tablet by mouth once daily before breakfast   LORazepam 0.5 MG tablet Commonly known as:  ATIVAN Take 0.5 tablets (0.25 mg total) by mouth 2 (two) times daily as needed for anxiety.   LORazepam 0.5 MG tablet Commonly known as:  ATIVAN Take 0.5 tablets (0.25 mg total) by mouth at  bedtime.   morphine CONCENTRATE 10 MG/0.5ML Soln concentrated solution Take 0.13 mLs (2.6 mg total) by mouth every 4 (four) hours as needed for shortness of breath (if unrelieved by other measures).   nitroGLYCERIN 0.4 MG SL tablet Commonly known as:  NITROSTAT Place 1 tablet (0.4 mg total) under the tongue every 5 (five) minutes as needed for chest pain.   RESOURCE THICKENUP CLEAR Powd Use as needed.            Durable Medical Equipment  (From admission, onward)        Start     Ordered   02/10/18 1120  DME Oxygen  Once    Question Answer Comment  Mode or (Route) Nasal cannula   Liters per Minute 2   Frequency Continuous (stationary and portable oxygen unit needed)   Oxygen conserving device Yes   Oxygen delivery system Gas      02/10/18 1120     Follow-up Information    Eustaquio Maize, MD. Schedule an appointment as soon as possible for a visit in 1 week(s).   Specialty:  Pediatrics Contact information: [place] [address] [phone]        Minus Breeding, MD .   Specialty:  Cardiology Contact information: [place] [address] [phone]          Allergies  Allergen Reactions  . Ativan [Lorazepam] Other (See Comments)    Large doses of Ativan - excessive drowsiness    Consultations:  Palliative care    Procedures/Studies: Dg Chest 2 View  Result Date: 01/15/2018 CLINICAL DATA:  Initial evaluation for acute shortness of breath. EXAM: CHEST - 2 VIEW COMPARISON:  Prior radiograph from 01/14/2018. FINDINGS: Stable cardiomegaly. Mediastinal silhouette normal. Aortic atherosclerosis. Lungs hypoinflated. Small moderate bilateral pleural effusions, right greater than left. Associated bibasilar opacities favored to reflect atelectatic changes. Increased vascular congestion with interstitial prominence as compared to previous, suggesting developing pulmonary interstitial edema. No other focal airspace disease. No  pneumothorax. Osseous structures are unchanged. IMPRESSION: 1. Cardiomegaly with slightly worsened diffuse pulmonary vascular congestion and interstitial prominence, suggesting mild and/or developing pulmonary interstitial edema. 2. Small moderate bilateral pleural effusions with associated atelectasis, right greater than left. 3. Aortic atherosclerosis. Electronically Signed   By: Jeannine Boga M.D.   On: 01/15/2018 20:58   Dg Chest 2 View  Result Date: 01/14/2018 CLINICAL DATA:  Short of breath, lower extremity edema, chest pain EXAM: CHEST - 2 VIEW COMPARISON:  Chest x-ray of 10/26/2017 FINDINGS: The lungs are not well aerated. Also there has been and increase in volume of bilateral pleural effusions with increase in bibasilar atelectasis. There does appear to be mild pulmonary vascular congestion present. Cardiomegaly is stable. There is moderate thoracic aortic atherosclerosis noted. No acute bony abnormality is present. IMPRESSION: 1. Diminished aeration with increasing volume of bilateral pleural effusions and increasing bibasilar atelectasis. 2. Cardiomegaly and probable mild pulmonary vascular  congestion. Electronically Signed   By: Ivar Drape M.D.   On: 01/14/2018 11:48   Nm Pulmonary Perf And Vent  Result Date: 01/16/2018 CLINICAL DATA:  Positive D-dimer.  Evaluate for pulmonary embolism. EXAM: NUCLEAR MEDICINE VENTILATION - PERFUSION LUNG SCAN TECHNIQUE: Ventilation images were obtained in multiple projections using inhaled aerosol Tc-[id number]. Perfusion images were obtained in multiple projections after intravenous injection of Tc-957mAA. RADIOPHARMACEUTICALS:  33.0 mCi of Tc-99110mPA aerosol inhalation and 4.3 mCi Tc99m5m IV COMPARISON:  Chest x-ray 01/15/2018 FINDINGS: Ventilation: No focal ventilation defect. Mild decreased uptake over the posterior right base conforming to effusion on chest x-ray. Perfusion: No wedge shaped peripheral perfusion defects to suggest acute pulmonary  embolism. Mild decreased uptake over the posterior right base conforming to pleural effusion on chest x-ray. IMPRESSION: Findings compatible with very low probability for pulmonary embolism. Electronically Signed   By: DaniMarin Olp.   On: 01/16/2018 15:22   Us VKoreaous Img Lower Unilateral Left  Result Date: 01/16/2018 CLINICAL DATA:  Left leg edema. EXAM: LEFT LOWER EXTREMITY VENOUS DOPPLER ULTRASOUND TECHNIQUE: Gray-scale sonography with graded compression, as well as color Doppler and duplex ultrasound were performed to evaluate the lower extremity deep venous systems from the level of the common femoral vein and including the common femoral, femoral, profunda femoral, popliteal and calf veins including the posterior tibial, peroneal and gastrocnemius veins when visible. The superficial great saphenous vein was also interrogated. Spectral Doppler was utilized to evaluate flow at rest and with distal augmentation maneuvers in the common femoral, femoral and popliteal veins. COMPARISON:  None. FINDINGS: Contralateral Common Femoral Vein: Respiratory phasicity is normal and symmetric with the symptomatic side. No evidence of thrombus. Normal compressibility. Common Femoral Vein: No evidence of thrombus. Normal compressibility, respiratory phasicity and response to augmentation. Saphenofemoral Junction: No evidence of thrombus. Normal compressibility and flow on color Doppler imaging. Profunda Femoral Vein: No evidence of thrombus. Normal compressibility and flow on color Doppler imaging. Femoral Vein: No evidence of thrombus. Normal compressibility, respiratory phasicity and response to augmentation. Popliteal Vein: No evidence of thrombus. Normal compressibility, respiratory phasicity and response to augmentation. Calf Veins: No evidence of thrombus. Normal compressibility and flow on color Doppler imaging. Superficial Great Saphenous Vein: No evidence of thrombus. Normal compressibility. Venous Reflux:   None. Other Findings:  None. IMPRESSION: No evidence of deep venous thrombosis.  Edema in the left calf. Electronically Signed   By: DaviDorise Bullion M.D   On: 01/16/2018 10:51   Dg Chest Port 1 View  Result Date: 02/10/2018 CLINICAL DATA:  Shortness of breath EXAM: PORTABLE CHEST 1 VIEW COMPARISON:  02/07/2018 FINDINGS: Cardiac shadow is enlarged but stable. Aortic calcifications are again seen. Bilateral pleural effusions are noted. Bibasilar atelectatic changes are seen. No bony abnormality is noted. IMPRESSION: Large bilateral pleural effusions stable from previous exam. Electronically Signed   By: MarkInez Catalina.   On: 02/10/2018 11:05   Dg Chest Port 1 View  Result Date: 02/07/2018 CLINICAL DATA:  Increased shortness of breath tonight.  Chest pain. EXAM: PORTABLE CHEST 1 VIEW COMPARISON:  01/17/2018 FINDINGS: Unchanged cardiomegaly. Unchanged mediastinal contours with aortic atherosclerosis. Bilateral pleural effusions have increased, small to moderate. Increased vascular congestion, mild pulmonary edema. No pneumothorax. IMPRESSION: Worsening CHF with increasing pleural effusions, vascular congestion and pulmonary edema. Electronically Signed   By: MelaJeb Levering.   On: 02/07/2018 06:43   Dg Chest Port 1 View  Result Date: 01/17/2018 CLINICAL DATA:  Dyspnea EXAM: PORTABLE CHEST  1 VIEW COMPARISON:  01/15/2018 FINDINGS: Chronic cardiomegaly. Congested appearance of vessels with small pleural effusions. No pneumothorax. No visible air bronchogram. IMPRESSION: 1. Stable from 2 days ago. 2. Cardiomegaly with vascular congestion and small effusions. Electronically Signed   By: Monte Fantasia M.D.   On: 01/17/2018 14:40     Subjective: No chest pain or sob, but slightly anxious.  Discharge Exam: Vitals:   02/10/18 0708 02/10/18 0821  BP: 132/72 113/73  Pulse:  96  Resp: (!) 25   Temp: 97.9 F (36.6 C)   SpO2:     Vitals:   02/09/18 2355 02/10/18 0342 02/10/18 0708 02/10/18  0821  BP: 129/82 127/63 132/72 113/73  Pulse: (!) 103 (!) 101  96  Resp: (!) 26 20 (!) 25   Temp: 98.1 F (36.7 C) (!) 97.3 F (36.3 C) 97.9 F (36.6 C)   TempSrc: Oral Oral Oral   SpO2: 100% 100%    Weight:  54.8 kg (120 lb 13 oz)    Height:        General: Pt is alert, awake, not in acute distress Cardiovascular: RRR, S1/S2 +,  Respiratory: CTA bilaterally, no wheezing, no rhonchi Abdominal: Soft, NT, ND, bowel sounds + Extremities: no edema, no cyanosis    The results of significant diagnostics from this hospitalization (including imaging, microbiology, ancillary and laboratory) are listed below for reference.     Microbiology: Recent Results (from the past 240 hour(s))  MRSA PCR Screening     Status: None   Collection Time: 02/07/18  4:39 PM  Result Value Ref Range Status   MRSA by PCR NEGATIVE NEGATIVE Final    Comment:        The GeneXpert MRSA Assay (FDA approved for NASAL specimens only), is one component of a comprehensive MRSA colonization surveillance program. It is not intended to diagnose MRSA infection nor to guide or monitor treatment for MRSA infections. Performed at [facility], [place] [address] [place], [place] [address]      Labs: BNP (last 3 results) Recent Labs    06/30/17 0714 01/14/18 1200 02/07/18 0545  BNP 665.4* 2,174.6* 2,706.2*   Basic Metabolic Panel: Recent Labs  Lab 02/07/18 0545 02/08/18 0651 02/09/18 0224  NA 138 144 140  K 4.4 3.6 3.8  CL 101 102 100*  CO2 _0 GLUCOSE 142* 98 107*  BUN 25* 28* 28*  CREATININE 1.21* 1.33* 0.99  CALCIUM 8.5* 8.6* 8.5*  MG  --   --  2.5*   Liver Function Tests: Recent Labs  Lab 02/07/18 0545  AST 79*  ALT 50  ALKPHOS 119  BILITOT 1.2  PROT 5.8*  ALBUMIN 2.9*   No results for input(s): LIPASE, AMYLASE in the last 168 hours. No results for input(s): AMMONIA in the last 168 hours. CBC: Recent Labs  Lab 02/07/18 0545 02/08/18 0651  WBC 11.2* 10.6*   NEUTROABS 6.9  --   HGB 12.4 11.8*  HCT 41.7 38.8  MCV 85.8 84.9  PLT 266 237   Cardiac Enzymes: No results for input(s): CKTOTAL, CKMB, CKMBINDEX, TROPONINI in the last 168 hours. BNP: Invalid input(s): POCBNP CBG: No results for input(s): GLUCAP in the last 168 hours. D-Dimer No results for input(s): DDIMER in the last 72 hours. Hgb A1c No results for input(s): HGBA1C in the last 72 hours. Lipid Profile No results for input(s): CHOL, HDL, LDLCALC, TRIG, CHOLHDL, LDLDIRECT in the last 72 hours. Thyroid function studies No results for input(s): TSH,  T4TOTAL, T3FREE, THYROIDAB in the last 72 hours.  Invalid input(s): FREET3 Anemia work up No results for input(s): VITAMINB12, FOLATE, FERRITIN, TIBC, IRON, RETICCTPCT in the last 72 hours. Urinalysis    Component Value Date/Time   COLORURINE YELLOW 02/07/2018 1130   APPEARANCEUR CLEAR 02/07/2018 1130   LABSPEC 1.006 02/07/2018 1130   PHURINE 6.0 02/07/2018 1130   GLUCOSEU NEGATIVE 02/07/2018 1130   HGBUR NEGATIVE 02/07/2018 1130   BILIRUBINUR NEGATIVE 02/07/2018 1130   BILIRUBINUR negative 10/08/2015 1725   KETONESUR NEGATIVE 02/07/2018 1130   PROTEINUR NEGATIVE 02/07/2018 1130   UROBILINOGEN negative 10/08/2015 1725   UROBILINOGEN 0.2 05/25/2013 1525   NITRITE NEGATIVE 02/07/2018 1130   LEUKOCYTESUR NEGATIVE 02/07/2018 1130   Sepsis Labs Invalid input(s): PROCALCITONIN,  WBC,  LACTICIDVEN Microbiology Recent Results (from the past 240 hour(s))  MRSA PCR Screening     Status: None   Collection Time: 02/07/18  4:39 PM  Result Value Ref Range Status   MRSA by PCR NEGATIVE NEGATIVE Final    Comment:        The GeneXpert MRSA Assay (FDA approved for NASAL specimens only), is one component of a comprehensive MRSA colonization surveillance program. It is not intended to diagnose MRSA infection nor to guide or monitor treatment for MRSA infections. Performed at [facility], [place] [address], [place],   [address]      Time coordinating discharge: 35 minutes  SIGNED:   Hosie Poisson, MD  [facility] 02/10/2018, 11:54 AM Pager   If 7PM-7AM, please contact night-coverage [email or web address] Password [email or web address]

## 2018-02-10 NOTE — Care Management Note (Addendum)
Case Management Note  Patient Details  Name: Lori [name] MRN: [id number] Date of Birth: 09-16-1919  Subjective/Objective:         Pt admitted with SOB           Action/Plan:  PTA from home with daughter - daughter and son will provide 24 hour care to mom at discharge.  Pt already has home oxygen 2 liters continuous (supplied by [facility]), walker and bedside commode in the home.  Pt/daughter has decided pt  will discharge home with hospice.   Per daughter - pt will transport home via private vehicle.  Choice of agency given and family chose [facility] - tentative referral accepted by [place] with agency - agency will contact CM regarding final referral acceptance post review of faxed information (Demographic sheet and H&P faxed to [phone]).  CM informed agency that family are interested in hospital bed.    Expected Discharge Date:  02/10/18               Expected Discharge Plan:  Home w Hospice Care  In-House Referral:     Discharge planning Services  CM Consult  Post Acute Care Choice:    Choice offered to:  Patient, Adult Children  DME Arranged:    DME Agency:     HH Arranged:    [place] Agency:  [facility]  Status of Service:  In process, will continue to follow  If discussed at Long Length of Stay Meetings, dates discussed:    Additional Comments: 02/10/2018  CM confirmed with daughter and pt that discharge plan continues to be home with hospice today  [facility] will bring portable tank to bedside, pt will discharge home on baseline oxygen needs.  Per daughter and [place] - pt can continue to utilize home oxygen equipment,  CM informed [facility] that pt will discharge home today.  Daughter has Hospice contact information and will call agency once pt arrives home.  Discharge Summary will  Need to be faxed to [phone]  02/09/18 Update:  [facility] has accepted pt.  Per agency - family would like  to forgo hospital bed.   Maryclare Labrador, RN 02/10/2018, 11:43 AM

## 2018-02-10 NOTE — Progress Notes (Signed)
Waiting on oxygen tank to arrive; paged MD to sign prescriptions; went over d/c instructions with pt & family; collected belongings; removed IV; will assist pt to change clothes and be wheeled out once ready.   [place]  Holliday Sheaffer, RN

## 2018-02-10 NOTE — Progress Notes (Signed)
Pt will eat lunch and then ride will pick her up to leave. Will wheel pt out once ready.   [place]  Kepler Mccabe, RN

## 2018-02-10 NOTE — Progress Notes (Signed)
Palliative:  I met today with Ms. Christoffel and her daughter, [name]. There was some confusion yesterday that her mother was no longer agreeing to hospice at home. We spoke more about this today and Ms. Funk agrees that hospice will provide her the best care at home. She is very anxious and very fearful of dying and [name] believes that she feels that hospice means she will die very soon. I explained the difference between hospice at home vs hospice facility where her husband recently died and she agrees with hospice now at home.   I also discussed with [name] who knows that she will need help in caring for her mother at home. She required multiple prn medications last night and will need continuing symptom management which will be best provided by hospice. [name] also tells me that she knows her mother does not have longer to live with worsening SOB even at rest and progressing CHF.   Emotional support provided to both. Plan home with hospice today. Discussed with Dr. Karleen Hampshire.   25 min  Vinie Sill, NP Palliative Medicine Team Pager # [phone] (M-F 8a-5p) Team Phone # [phone] (Nights/Weekends)

## 2018-02-13 ENCOUNTER — Other Ambulatory Visit: Payer: Self-pay | Admitting: Cardiology

## 2018-02-15 NOTE — Telephone Encounter (Signed)
Rx sent to pharmacy   

## 2018-02-16 ENCOUNTER — Ambulatory Visit (INDEPENDENT_AMBULATORY_CARE_PROVIDER_SITE_OTHER): Payer: Medicare Other

## 2018-02-16 DIAGNOSIS — I130 Hypertensive heart and chronic kidney disease with heart failure and stage 1 through stage 4 chronic kidney disease, or unspecified chronic kidney disease: Secondary | ICD-10-CM

## 2018-02-16 DIAGNOSIS — E785 Hyperlipidemia, unspecified: Secondary | ICD-10-CM

## 2018-02-16 DIAGNOSIS — I2510 Atherosclerotic heart disease of native coronary artery without angina pectoris: Secondary | ICD-10-CM | POA: Diagnosis not present

## 2018-02-16 DIAGNOSIS — E039 Hypothyroidism, unspecified: Secondary | ICD-10-CM | POA: Diagnosis not present

## 2018-02-16 DIAGNOSIS — I5023 Acute on chronic systolic (congestive) heart failure: Secondary | ICD-10-CM

## 2018-02-16 DIAGNOSIS — I5030 Unspecified diastolic (congestive) heart failure: Secondary | ICD-10-CM

## 2018-02-16 DIAGNOSIS — N183 Chronic kidney disease, stage 3 (moderate): Secondary | ICD-10-CM

## 2018-02-16 DIAGNOSIS — M1990 Unspecified osteoarthritis, unspecified site: Secondary | ICD-10-CM

## 2018-02-19 DEATH — deceased

## 2018-02-25 ENCOUNTER — Ambulatory Visit: Payer: Medicare Other | Admitting: Cardiology

## 2018-06-01 ENCOUNTER — Ambulatory Visit: Payer: Medicare Other | Admitting: Cardiology

## 2019-05-22 IMAGING — DX DG CHEST 1V PORT
1 series · 1 of 1 positions shown · non-contrast
Comparison: 10/25/2016

CLINICAL DATA: Short of breath, wheezing

EXAM:
PORTABLE CHEST 1 VIEW

[chest ap]
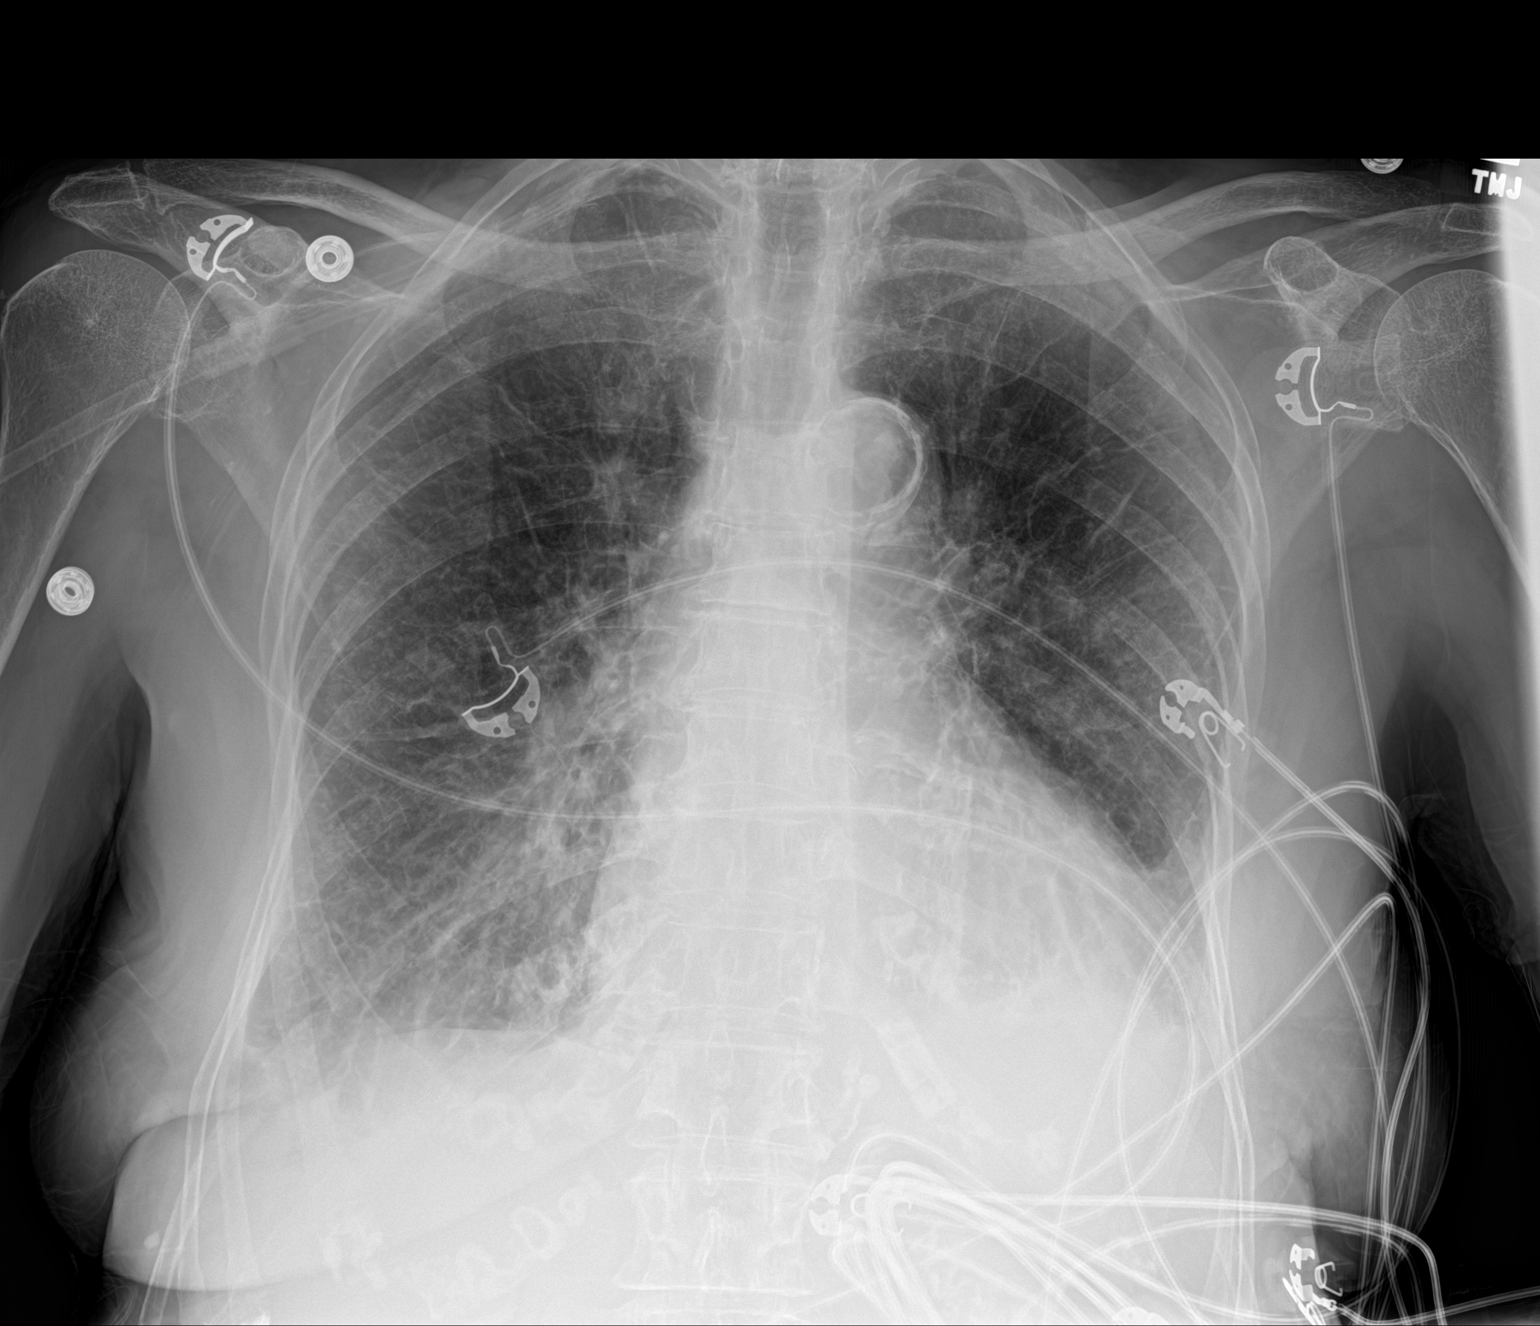

[1 of 1 positions shown; findings below may reference images not displayed]

FINDINGS: Normal cardiac silhouette. Small bilateral pleural effusions.
Chronic bronchitic markings and hyperinflated lungs. Skin fold noted
along the LEFT lateral chest wall. Linear peripheral interstitial
markings.
IMPRESSION: Interstitial edema with bilateral small effusions.

## 2019-12-06 IMAGING — DX DG CHEST 2V
2 series · 2 of 2 positions shown · non-contrast
Comparison: Chest x-ray of 10/26/2017

CLINICAL DATA: Short of breath, lower extremity edema, chest pain

EXAM:
CHEST - 2 VIEW

[chest pa]
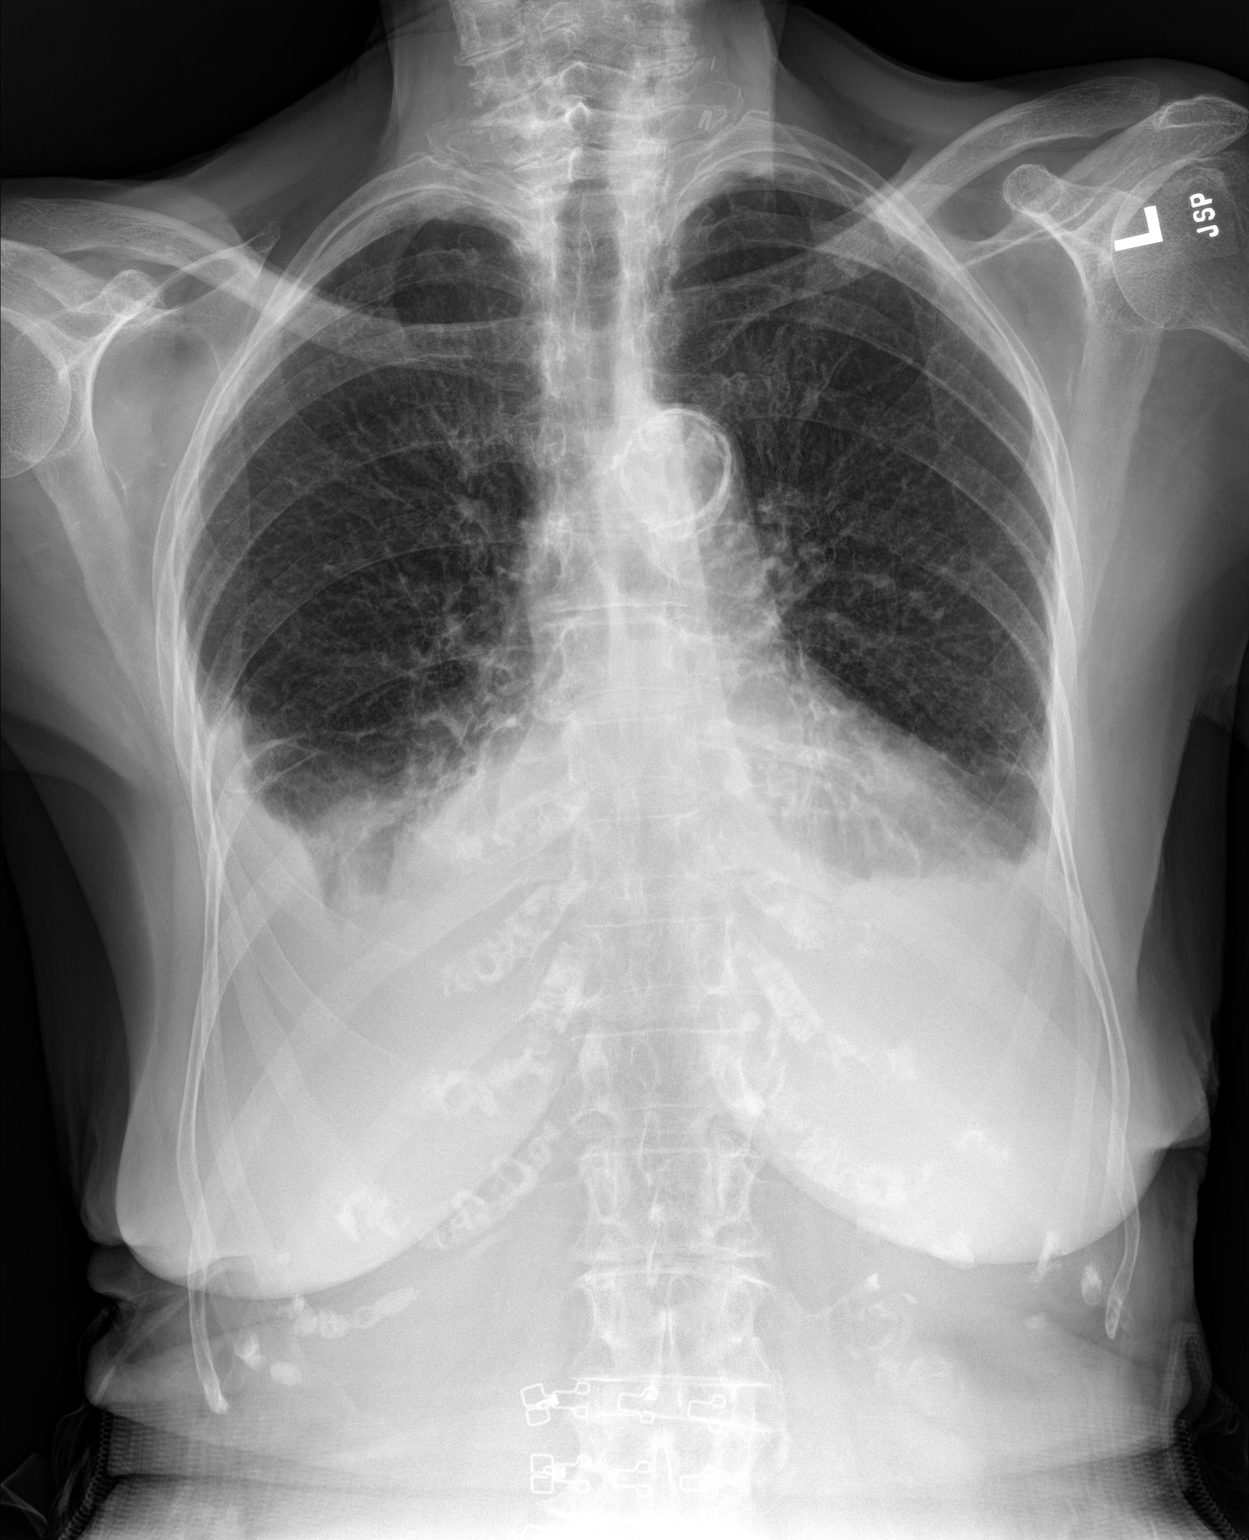

[chest lat]
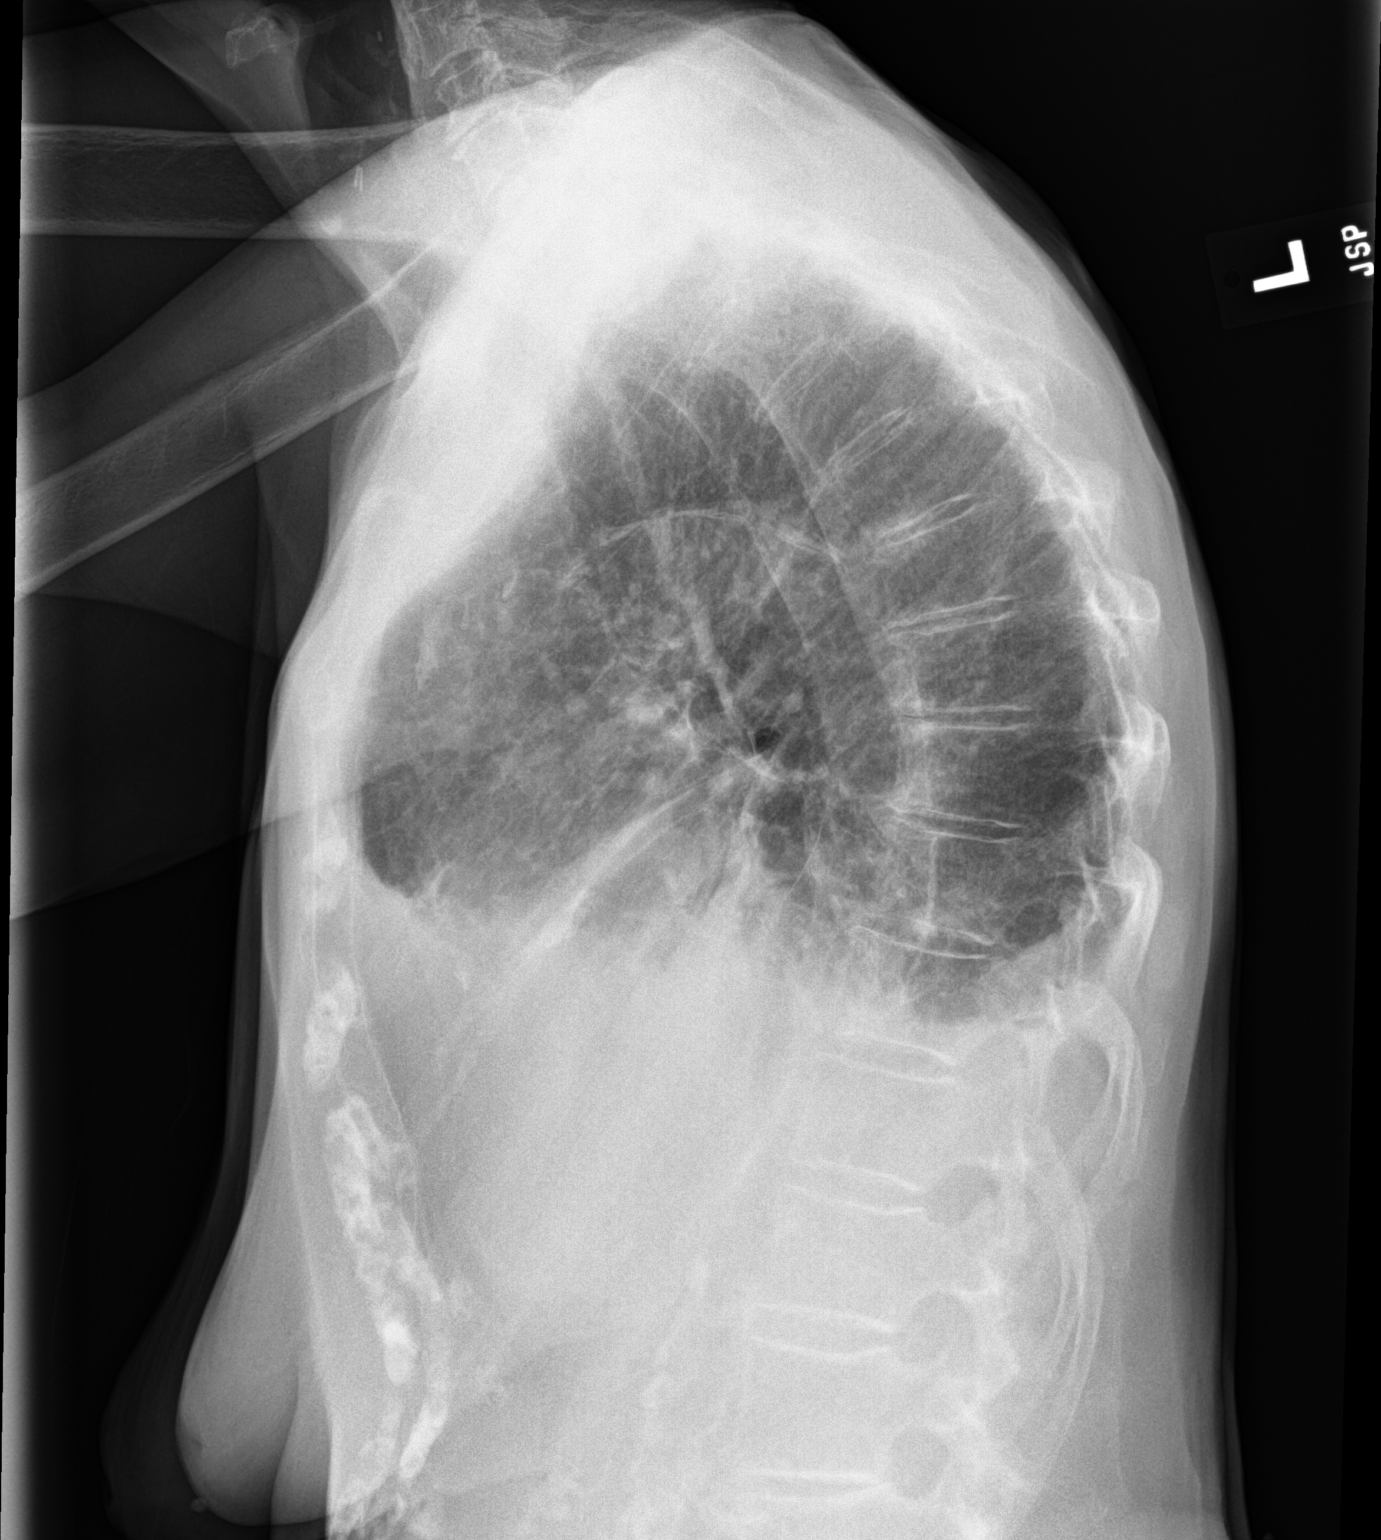

[2 of 2 positions shown; findings below may reference images not displayed]

FINDINGS: The lungs are not well aerated. Also there has been and increase in
volume of bilateral pleural effusions with increase in bibasilar
atelectasis. There does appear to be mild pulmonary vascular
congestion present. Cardiomegaly is stable. There is moderate
thoracic aortic atherosclerosis noted. No acute bony abnormality is
present.
IMPRESSION: 1. Diminished aeration with increasing volume of bilateral pleural
effusions and increasing bibasilar atelectasis.
2. Cardiomegaly and probable mild pulmonary vascular congestion.

## 2019-12-07 IMAGING — DX DG CHEST 2V
2 series · 2 of 2 positions shown · non-contrast
Comparison: Prior radiograph from 01/14/2018.

CLINICAL DATA: Initial evaluation for acute shortness of breath.

EXAM:
CHEST - 2 VIEW

[chest lat]
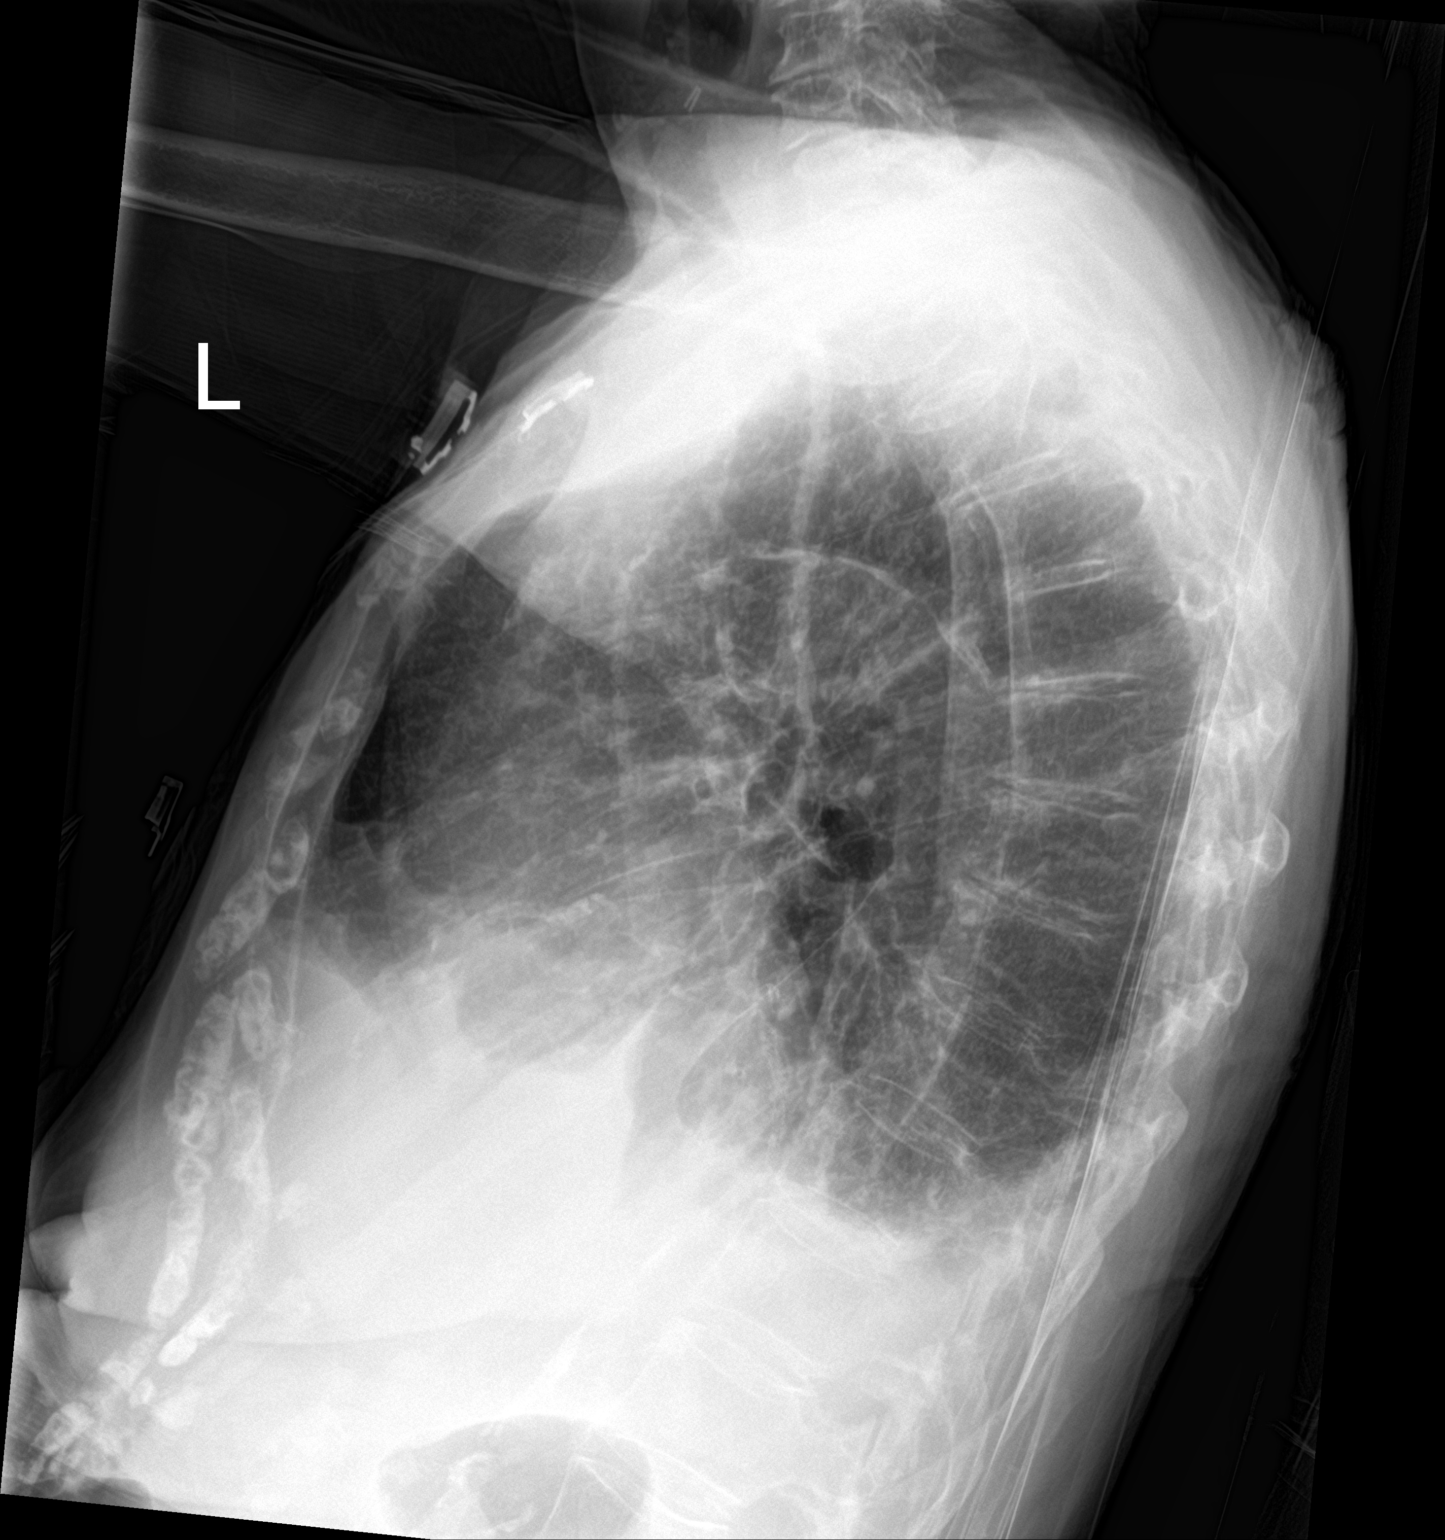

[chest ap]
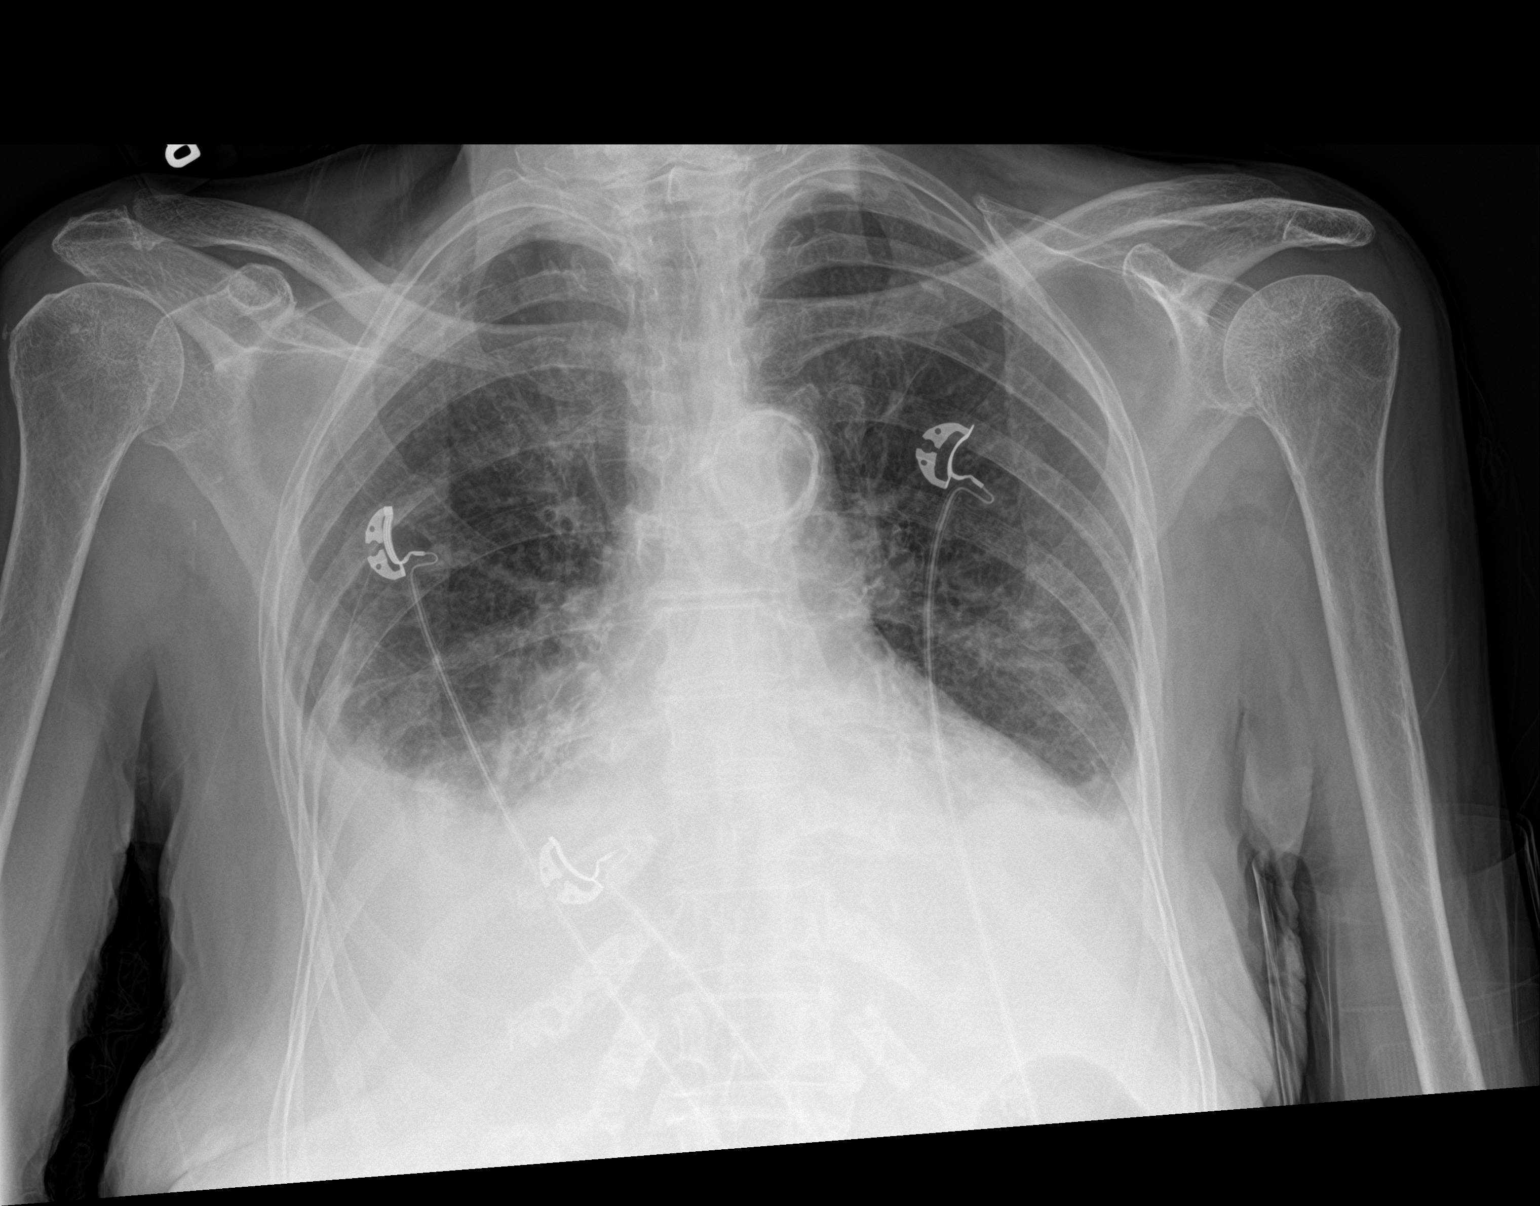

[2 of 2 positions shown; findings below may reference images not displayed]

FINDINGS: Stable cardiomegaly. Mediastinal silhouette normal. Aortic
atherosclerosis.

Lungs hypoinflated. Small moderate bilateral pleural effusions,
right greater than left. Associated bibasilar opacities favored to
reflect atelectatic changes. Increased vascular congestion with
interstitial prominence as compared to previous, suggesting
developing pulmonary interstitial edema. No other focal airspace
disease. No pneumothorax.

Osseous structures are unchanged.
IMPRESSION: 1. Cardiomegaly with slightly worsened diffuse pulmonary vascular
congestion and interstitial prominence, suggesting mild and/or
developing pulmonary interstitial edema.
2. Small moderate bilateral pleural effusions with associated
atelectasis, right greater than left.
3. Aortic atherosclerosis.
